# Patient Record
Sex: Female | Born: 1965 | Race: White | Hispanic: No | Marital: Married | Smoking: Never smoker
Health system: Southern US, Community
[De-identification: ages and names within clinical notes are randomized; demographics above are authoritative.]

## PROBLEM LIST (undated history)

## (undated) DIAGNOSIS — H269 Unspecified cataract: Secondary | ICD-10-CM

## (undated) DIAGNOSIS — I10 Essential (primary) hypertension: Secondary | ICD-10-CM

## (undated) DIAGNOSIS — E119 Type 2 diabetes mellitus without complications: Secondary | ICD-10-CM

## (undated) DIAGNOSIS — G709 Myoneural disorder, unspecified: Secondary | ICD-10-CM

## (undated) HISTORY — PX: CHOLECYSTECTOMY: SHX55

## (undated) HISTORY — DX: Unspecified cataract: H26.9

## (undated) HISTORY — DX: Myoneural disorder, unspecified: G70.9

## (undated) HISTORY — DX: Type 2 diabetes mellitus without complications: E11.9

## (undated) HISTORY — DX: Essential (primary) hypertension: I10

---

## 2006-05-13 ENCOUNTER — Ambulatory Visit: Payer: Self-pay | Admitting: Obstetrics and Gynecology

## 2006-05-16 ENCOUNTER — Ambulatory Visit: Payer: Self-pay | Admitting: Obstetrics and Gynecology

## 2009-07-26 ENCOUNTER — Ambulatory Visit: Payer: Self-pay | Admitting: Obstetrics and Gynecology

## 2009-08-07 ENCOUNTER — Ambulatory Visit: Payer: Self-pay | Admitting: Obstetrics and Gynecology

## 2010-01-16 ENCOUNTER — Ambulatory Visit: Payer: Self-pay | Admitting: Internal Medicine

## 2010-01-23 ENCOUNTER — Ambulatory Visit: Payer: Self-pay | Admitting: Internal Medicine

## 2011-08-30 IMAGING — US TRANSABDOMINAL ULTRASOUND OF PELVIS
1 series · 17 of 25 positions shown · non-contrast
Comparison: none

REASON FOR EXAM: pelvic pain eval possible cyst on left ovary shown on
recent CT scan
COMMENTS:

[Series 1: transabdominal ultrasound of pelvis · 17 of 44 slices shown]
[im 1/44]
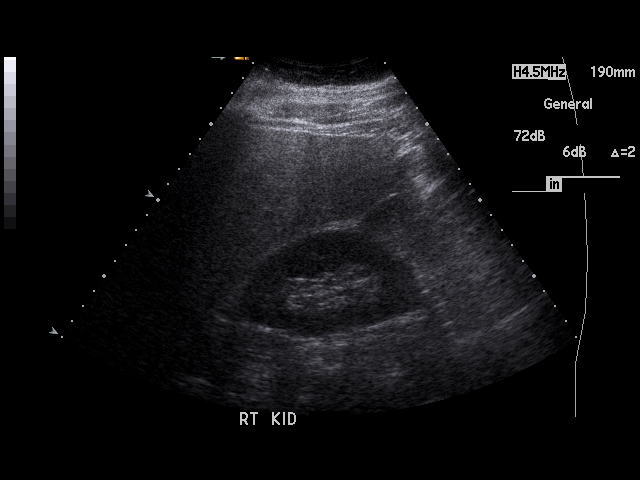
[im 4/44]
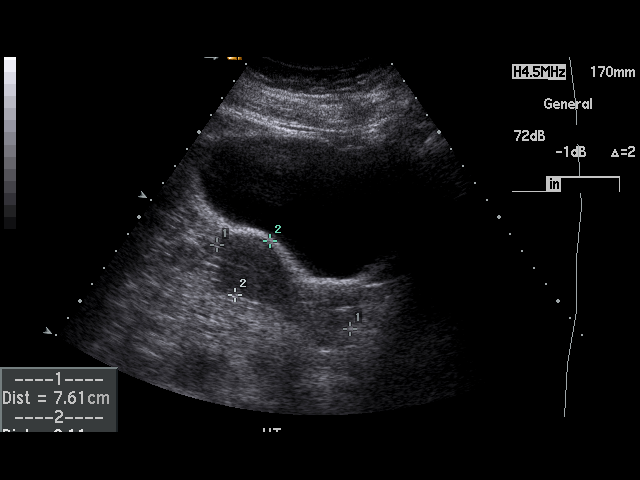
[im 6/44]
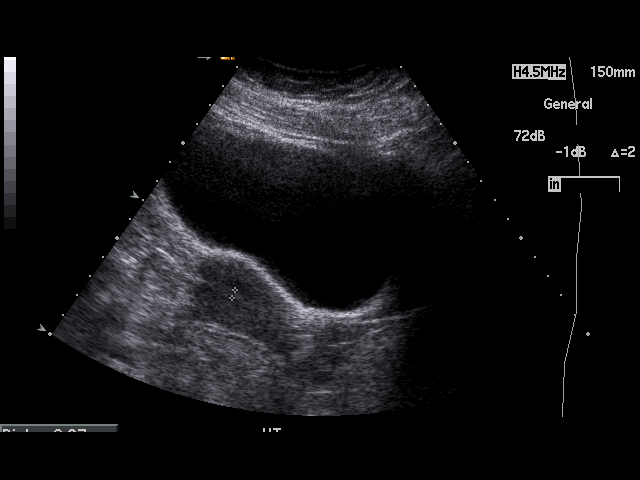
[im 9/44]
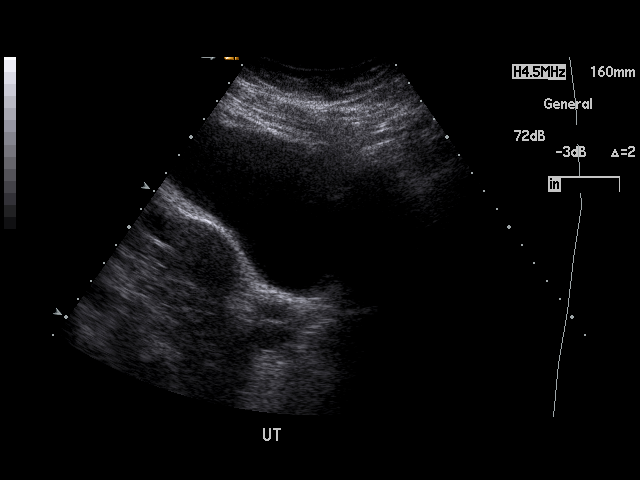
[im 11/44]
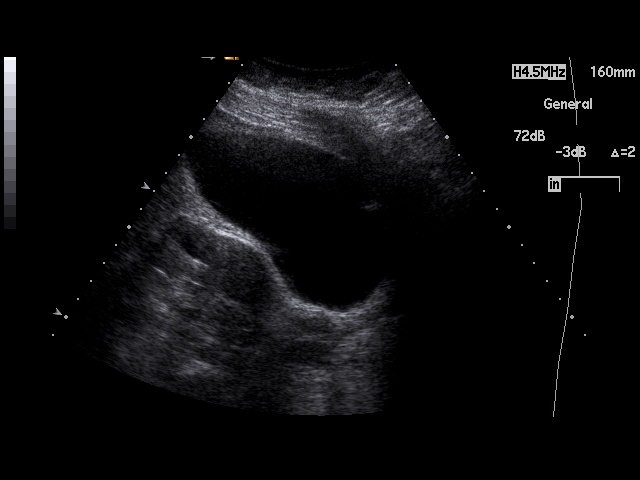
[im 15/44]
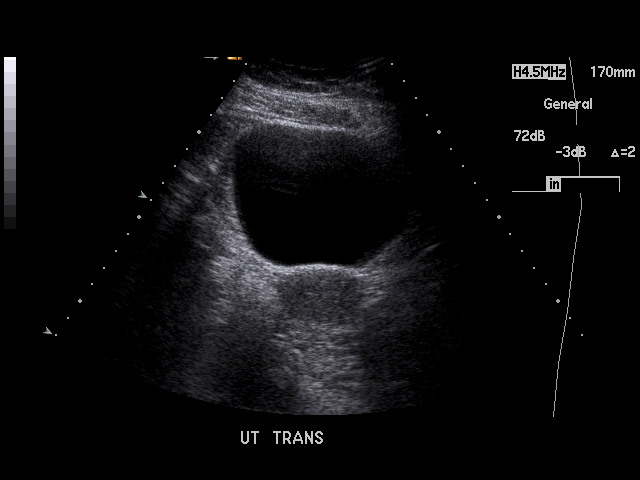
[im 17/44]
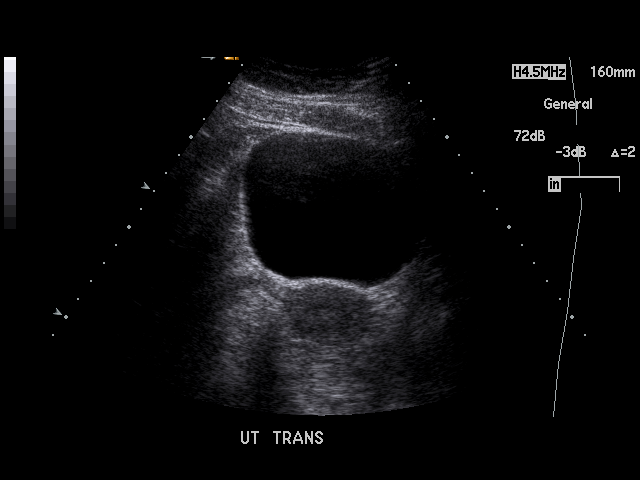
[im 20/44]
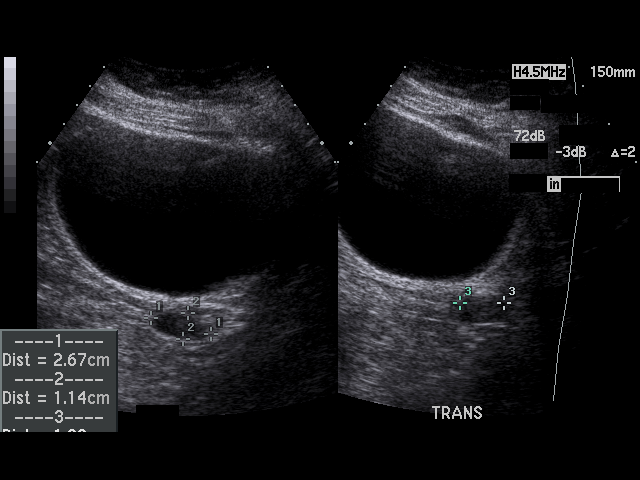
[im 22/44]
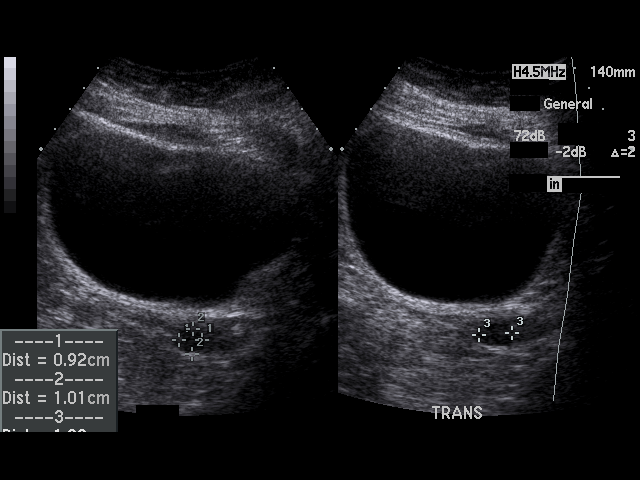
[im 24/44]
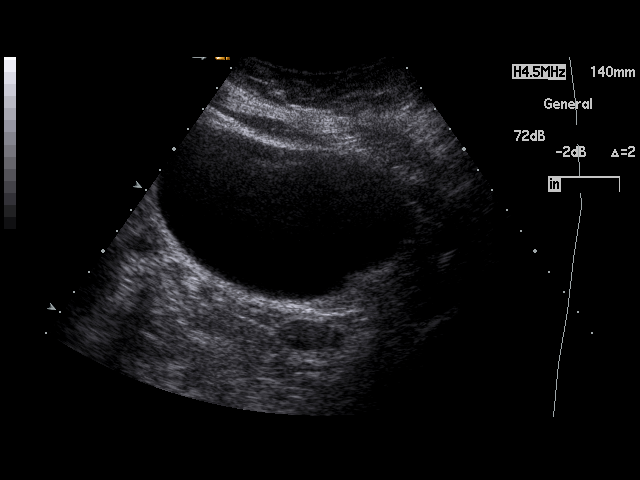
[im 27/44]
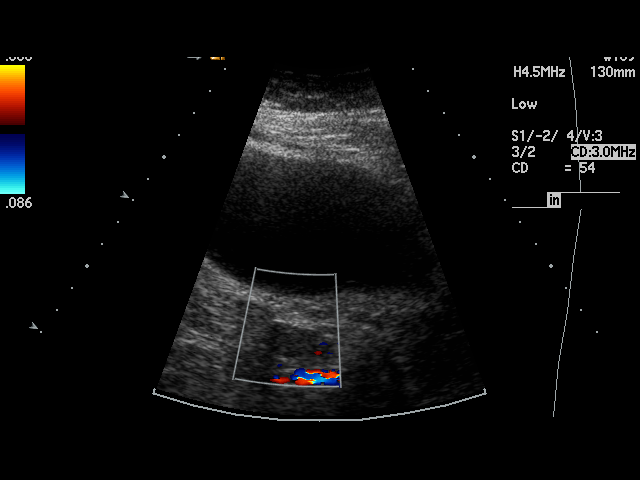
[im 29/44]
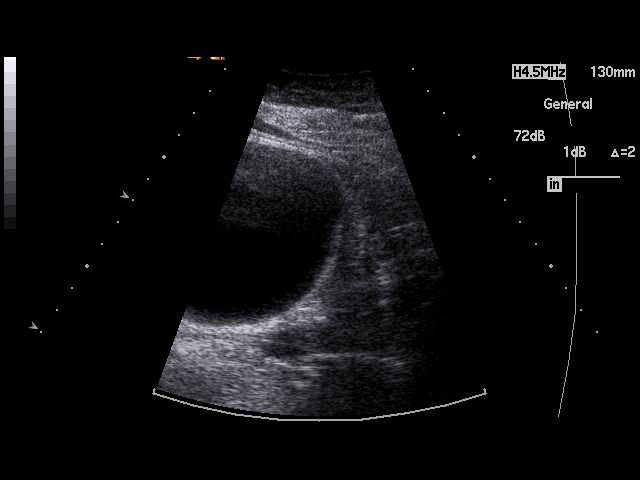
[im 33/44]
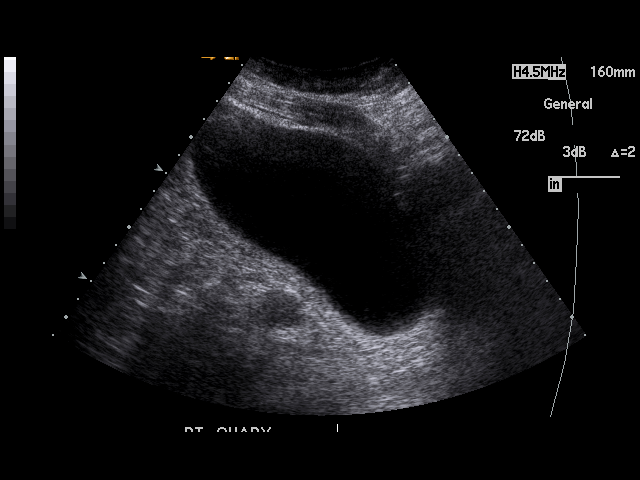
[im 35/44]
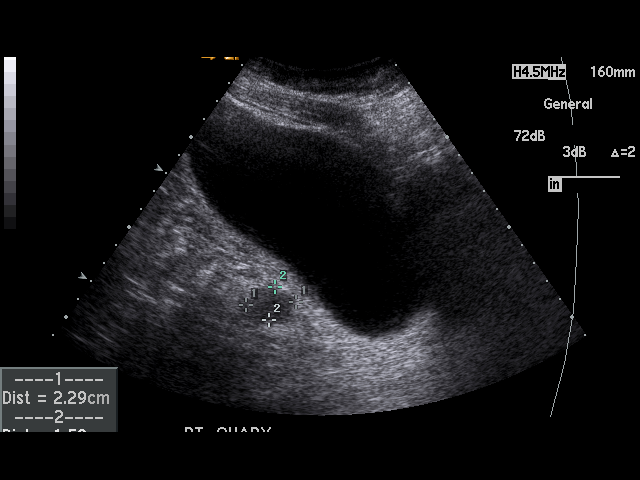
[im 38/44]
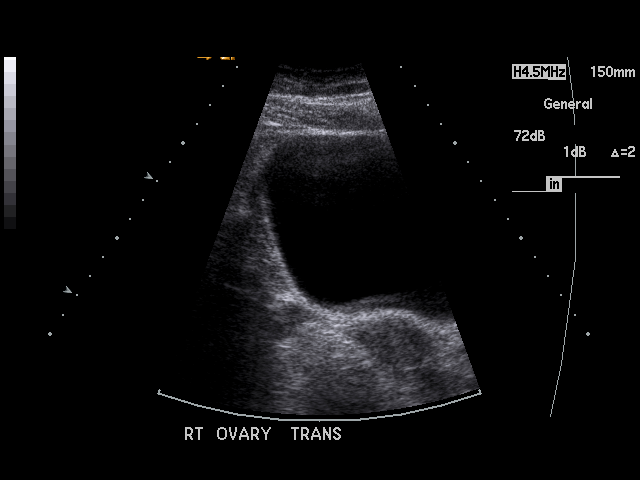
[im 40/44]
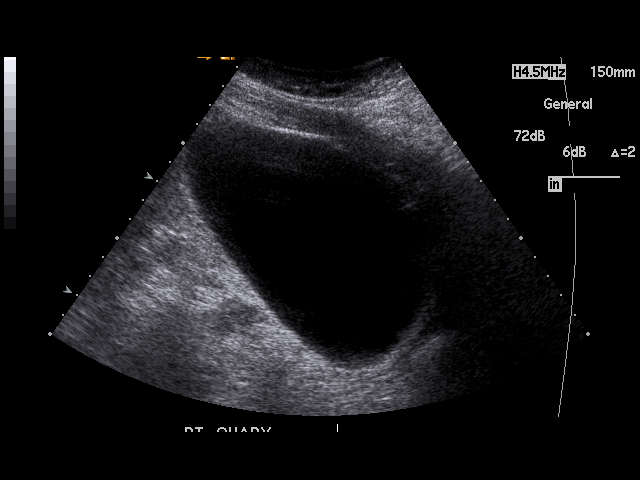
[im 44/44]
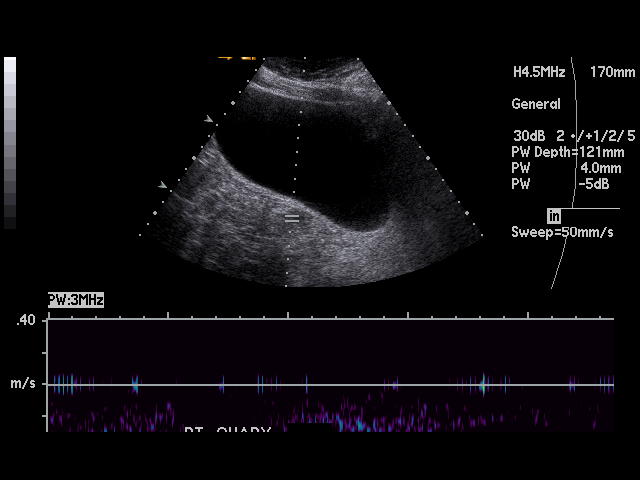

[17 of 25 positions shown; findings below may reference images not displayed]

PROCEDURE:     PAMELA - PAMELA PELVIS NON-OB  - January 23, 2010 [DATE]

RESULT:     The uterus is normal in echotexture and contour. It measures
x 3.1 x 4.2 cm. The endometrial stripe is normal at just under 4 mm in
thickness. No abnormal endometrial fluid collections are demonstrated. There
are nabothian cysts in the cervix.

The right ovary measures 2.3 x 1.5 x 1.5 cm and exhibits normal echotexture
and vascularity. The left ovary measures 2.7 x 1.1 x 1.9 cm and contains a
tiny cyst measuring 1 x 0.9 x 1.3 cm. Vascularity of the left ovary appears
normal. There is no free fluid in the pelvis. Survey views of the kidneys
reveal no evidence of hydronephrosis.
IMPRESSION: 1. There is a normal appearance of the uterus. There are nabothian cysts
present.
2. There is a tiny cyst in the left ovary that appears to be simple cystic
in nature. This corresponds to the CT findings on 16 January, 2010. I see no
abnormalities elsewhere within the adnexal regions.

## 2012-01-08 DIAGNOSIS — Z8619 Personal history of other infectious and parasitic diseases: Secondary | ICD-10-CM | POA: Insufficient documentation

## 2012-12-09 DIAGNOSIS — E119 Type 2 diabetes mellitus without complications: Secondary | ICD-10-CM | POA: Insufficient documentation

## 2013-01-07 HISTORY — PX: OTHER SURGICAL HISTORY: SHX169

## 2013-02-11 DIAGNOSIS — I10 Essential (primary) hypertension: Secondary | ICD-10-CM | POA: Insufficient documentation

## 2013-03-30 DIAGNOSIS — IMO0002 Reserved for concepts with insufficient information to code with codable children: Secondary | ICD-10-CM | POA: Insufficient documentation

## 2013-06-11 LAB — HM COLONOSCOPY

## 2013-10-20 LAB — HM PAP SMEAR: HM Pap smear: NEGATIVE

## 2015-02-14 LAB — LIPID PANEL
CHOLESTEROL: 168 mg/dL (ref 0–200)
HDL: 32 mg/dL — AB (ref 35–70)
LDL Cholesterol: 88 mg/dL
TRIGLYCERIDES: 238 mg/dL — AB (ref 40–160)

## 2015-02-14 LAB — HEPATIC FUNCTION PANEL
ALT: 40 U/L — AB (ref 7–35)
AST: 29 U/L (ref 13–35)
Alkaline Phosphatase: 73 U/L (ref 25–125)

## 2015-02-14 LAB — BASIC METABOLIC PANEL
BUN: 14 mg/dL (ref 4–21)
Creatinine: 0.7 mg/dL (ref 0.5–1.1)
Glucose: 177 mg/dL
POTASSIUM: 4 mmol/L (ref 3.4–5.3)
SODIUM: 141 mmol/L (ref 137–147)

## 2015-02-14 LAB — CBC AND DIFFERENTIAL
HEMATOCRIT: 40 % (ref 36–46)
HEMOGLOBIN: 13.3 g/dL (ref 12.0–16.0)
PLATELETS: 316 10*3/uL (ref 150–399)
WBC: 8.3 10*3/mL

## 2015-02-14 LAB — TSH: TSH: 2.04 u[IU]/mL (ref 0.41–5.90)

## 2015-02-14 LAB — MICROALBUMIN, URINE: Microalb, Ur: 13.6

## 2015-03-07 LAB — HM MAMMOGRAPHY

## 2015-06-02 DIAGNOSIS — N951 Menopausal and female climacteric states: Secondary | ICD-10-CM | POA: Insufficient documentation

## 2015-06-02 DIAGNOSIS — E1141 Type 2 diabetes mellitus with diabetic mononeuropathy: Secondary | ICD-10-CM | POA: Insufficient documentation

## 2015-11-15 LAB — HEMOGLOBIN A1C: HEMOGLOBIN A1C: 7.4

## 2015-11-15 LAB — HM DIABETES FOOT EXAM: HM DIABETIC FOOT EXAM: NORMAL

## 2016-02-09 DIAGNOSIS — M5431 Sciatica, right side: Secondary | ICD-10-CM | POA: Diagnosis not present

## 2016-02-09 DIAGNOSIS — M25551 Pain in right hip: Secondary | ICD-10-CM | POA: Diagnosis not present

## 2016-02-09 DIAGNOSIS — M9903 Segmental and somatic dysfunction of lumbar region: Secondary | ICD-10-CM | POA: Diagnosis not present

## 2016-04-09 ENCOUNTER — Encounter: Payer: Self-pay | Admitting: Family Medicine

## 2016-04-09 ENCOUNTER — Ambulatory Visit (INDEPENDENT_AMBULATORY_CARE_PROVIDER_SITE_OTHER): Payer: 59 | Admitting: Family Medicine

## 2016-04-09 VITALS — BP 131/82 | HR 88 | Temp 98.4°F | Resp 17 | Ht 63.75 in | Wt 189.0 lb

## 2016-04-09 DIAGNOSIS — E785 Hyperlipidemia, unspecified: Secondary | ICD-10-CM

## 2016-04-09 DIAGNOSIS — E1169 Type 2 diabetes mellitus with other specified complication: Secondary | ICD-10-CM

## 2016-04-09 DIAGNOSIS — I10 Essential (primary) hypertension: Secondary | ICD-10-CM

## 2016-04-09 DIAGNOSIS — F4329 Adjustment disorder with other symptoms: Secondary | ICD-10-CM

## 2016-04-09 DIAGNOSIS — E1141 Type 2 diabetes mellitus with diabetic mononeuropathy: Secondary | ICD-10-CM

## 2016-04-09 DIAGNOSIS — E1149 Type 2 diabetes mellitus with other diabetic neurological complication: Secondary | ICD-10-CM

## 2016-04-09 DIAGNOSIS — F4321 Adjustment disorder with depressed mood: Secondary | ICD-10-CM

## 2016-04-09 DIAGNOSIS — Z8619 Personal history of other infectious and parasitic diseases: Secondary | ICD-10-CM

## 2016-04-09 DIAGNOSIS — Z634 Disappearance and death of family member: Secondary | ICD-10-CM

## 2016-04-09 LAB — MICROALBUMIN, URINE WAIVED
Creatinine, Urine Waived: 200 mg/dL (ref 10–300)
Microalb, Ur Waived: 80 mg/L — ABNORMAL HIGH (ref 0–19)

## 2016-04-09 MED ORDER — DULOXETINE HCL 20 MG PO CPEP: 20.0000 mg | ORAL_CAPSULE | Freq: Every day | ORAL | 3 refills | Status: DC

## 2016-04-09 NOTE — Assessment & Plan Note (Signed)
Under good control. Continue current regimen. Continue to monitor. Call with any concerns. 

## 2016-04-09 NOTE — Assessment & Plan Note (Signed)
Will start her on cymbalta. List of grief events given. Call with any concerns. Recheck 1 month.

## 2016-04-09 NOTE — Progress Notes (Signed)
BP 131/82 (BP Location: Left Arm, Patient Position: Sitting, Cuff Size: Normal)   Pulse 88   Temp 98.4 F (36.9 C) (Oral)   Resp 17   Ht 5' 3.75" (1.619 m)   Wt 189 lb (85.7 kg)   SpO2 97%   BMI 32.70 kg/m    Subjective:    Patient ID: Lori Horne, female    DOB: Nov 23, 1965, 51 y.o.   MRN: [id number]  HPI: Lori Horne is a 51 y.o. female who presents today to establish care  Chief Complaint  Patient presents with  . Establish Care  . Diabetes  . Hypertension  . Hyperlipidemia   DIABETES- diagnosed about 5 years ago Hypoglycemic episodes:no Polydipsia/polyuria: no Visual disturbance: no Chest pain: no Paresthesias: no Glucose Monitoring: yes  Accucheck frequency: 2x a week  Fasting glucose: 102-120 Taking Insulin?: no Blood Pressure Monitoring: not checking Retinal Examination: Not up to Date Foot Exam: Up to Date Diabetic Education: Completed Pneumovax: Not up to Date Influenza: Not up to Date Aspirin: yes  HYPERTENSION / HYPERLIPIDEMIA Satisfied with current treatment? yes Duration of hypertension: chronic BP monitoring frequency: not checking BP medication side effects: no Past BP meds: losartan-HCTZ Duration of hyperlipidemia: chronic Cholesterol medication side effects: no Cholesterol supplements: none Past cholesterol medications: pravastatin Medication compliance: excellent compliance Aspirin: yes Recent stressors: yes Recurrent headaches: no Visual changes: no Palpitations: no Dyspnea: no Chest pain: no Lower extremity edema: no Dizzy/lightheaded: no  DEPRESSION Mood status: exacerbated Satisfied with current treatment?: no Symptom severity: moderate  Duration of current treatment : Not on anything Psychotherapy/counseling: no  Previous psychiatric medications: none Depressed mood: yes Anxious mood: no Anhedonia: no Significant weight loss or gain: no Insomnia: no  Fatigue: yes Feelings of worthlessness or guilt: no Impaired  concentration/indecisiveness: yes Suicidal ideations: no Hopelessness: no Crying spells: yes Depression screen PHQ 2/9 04/09/2016  Decreased Interest 0  Down, Depressed, Hopeless 1  PHQ - 2 Score 1    Active Ambulatory Problems    Diagnosis Date Noted  . Hypertension 02/11/2013  . 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 PSC (posterior subcapsular cataract), left 03/30/2013  . Type 2 diabetes mellitus (HCC) 12/09/2012  . Vasomotor symptoms due to menopause 06/02/2015  . Hyperlipidemia associated with type 2 diabetes mellitus (HCC) 04/09/2016  . History of Helicobacter pylori infection 01/08/2012  . 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 No Resolved Ambulatory Problems   Past Medical History:  Diagnosis Date  . Cataract   . Diabetes mellitus without complication (HCC)   . Hypertension   . Neuromuscular disorder [facility])    Past Surgical History:  Procedure Laterality Date  . catarac surgery Left 01/07/2013   Outpatient Encounter Prescriptions as of 04/09/2016  Medication Sig  . aspirin EC 81 MG tablet Take 81 mg by mouth.  Marland Kitchen glimepiride (AMARYL) 1 MG tablet Take 1 mg by mouth.  . hydrochlorothiazide (HYDRODIURIL) 25 MG tablet Take 25 mg by mouth.  . losartan (COZAAR) 50 MG tablet Take 50 mg by mouth.  . metFORMIN (GLUCOPHAGE) 500 MG tablet Take 1,000 mg by mouth.  . Multiple Vitamin (MULTI-VITAMINS) TABS Take by mouth.  . pravastatin (PRAVACHOL) 20 MG tablet Take 20 mg by mouth.  . [DISCONTINUED] gabapentin (NEURONTIN) 300 MG capsule Take 300 mg by mouth.  . DULoxetine (CYMBALTA) 20 MG capsule Take 1 capsule (20 mg total) by mouth daily.   No facility-administered encounter medications on file as of  04/09/2016.    No Known Allergies  Social History   Social History  . Marital status: Married    Spouse name: N/A  . Number of children: N/A  . Years of education: N/A   Social History Main Topics  . Smoking  status: Never Smoker  . Smokeless tobacco: Never Used  . Alcohol use None     Comment: Occasional glass of wine  . Drug use: No  . Sexual activity: Not Asked   Other Topics Concern  . None   Social History Narrative  . None   Family History  Problem Relation Age of Onset  . Adopted: Yes   Review of Systems  Constitutional: Negative.   Respiratory: Negative.   Cardiovascular: Negative.   Neurological: Negative.   Psychiatric/Behavioral: Positive for dysphoric mood. Negative for agitation, behavioral problems, confusion, decreased concentration, hallucinations, self-injury, sleep disturbance and suicidal ideas. The patient is nervous/anxious. The patient is not hyperactive.     Per HPI unless specifically indicated above     Objective:    BP 131/82 (BP Location: Left Arm, Patient Position: Sitting, Cuff Size: Normal)   Pulse 88   Temp 98.4 F (36.9 C) (Oral)   Resp 17   Ht 5' 3.75" (1.619 m)   Wt 189 lb (85.7 kg)   SpO2 97%   BMI 32.70 kg/m   Wt Readings from Last 3 Encounters:  04/09/16 189 lb (85.7 kg)    Physical Exam  Constitutional: She is oriented to person, place, and time. She appears well-developed and well-nourished. No distress.  HENT:  Head: Normocephalic and atraumatic.  Right Ear: Hearing normal.  Left Ear: Hearing normal.  Nose: Nose normal.  Eyes: Conjunctivae and lids are normal. Right eye exhibits no discharge. Left eye exhibits no discharge. No scleral icterus.  Cardiovascular: Normal rate, regular rhythm, normal heart sounds and intact distal pulses.  Exam reveals no gallop and no friction rub.   No murmur heard. Pulmonary/Chest: Effort normal and breath sounds normal. No respiratory distress. She has no wheezes. She has no rales. She exhibits no tenderness.  Musculoskeletal: Normal range of motion.  Neurological: She is alert and oriented to person, place, and time.  Skin: Skin is warm, dry and intact. No rash noted. She is not diaphoretic.  No erythema. No pallor.  Psychiatric: She has a normal mood and affect. Her speech is normal and behavior is normal. Judgment and thought content normal. Cognition and memory are normal.  Tearful at times  Nursing note and vitals reviewed.   Results for orders placed or performed in visit on 04/09/16  HM MAMMOGRAPHY  Result Value Ref Range   HM Mammogram 0-4 Bi-Rad 0-4 Bi-Rad, Self Reported Normal  Hemoglobin A1c  Result Value Ref Range   Hemoglobin A1C 7.4   Microalbumin, urine  Result Value Ref Range   Microalb, Ur 13.6   HM PAP SMEAR  Result Value Ref Range   HM Pap smear Negative with Negative HPV   Basic metabolic panel  Result Value Ref Range   Glucose 177 mg/dL   BUN 14 4 - 21 mg/dL   Creatinine 0.7 0.5 - 1.1 mg/dL   Potassium 4.0 3.4 - 5.3 mmol/L   Sodium 141 137 - 147 mmol/L  Lipid panel  Result Value Ref Range   Triglycerides 238 (A) 40 - 160 mg/dL   Cholesterol 098 0 - 119 mg/dL   HDL 32 (A) 35 - 70 mg/dL   LDL Cholesterol 88 mg/dL  Hepatic function panel  Result Value Ref Range   Alkaline Phosphatase 73 25 - 125 U/L   ALT 40 (A) 7 - 35 U/L   AST 29 13 - 35 U/L  TSH  Result Value Ref Range   TSH 2.04 0.41 - 5.90 uIU/mL  CBC and differential  Result Value Ref Range   Hemoglobin 13.3 12.0 - 16.0 g/dL   HCT 40 36 - 46 %   Platelets 316 150 - 399 K/L   WBC 8.3 10^3/mL  HM DIABETES FOOT EXAM  Result Value Ref Range   HM Diabetic Foot Exam Normal   HM COLONOSCOPY  Result Value Ref Range   HM Colonoscopy See Report (in chart) See Report (in chart), Patient Reported      Assessment & Plan:   Problem List Items Addressed This Visit      Cardiovascular and Mediastinum   Hypertension - Primary    Under good control. Continue current regimen. Continue to monitor. Call with any concerns.       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 Call with any concerns. Continue to monitor.       Type 2 diabetes mellitus (HCC)    Under good control. Continue current regimen. Continue to monitor. Call with any concerns.       Relevant Orders   Comprehensive metabolic panel   Microalbumin, Urine Waived   Hgb A1c w/o eAG   Hyperlipidemia associated with type 2 diabetes mellitus (HCC)    Under good control. Continue current regimen. Continue to monitor. Call with any concerns.       Relevant Orders   Comprehensive metabolic panel   Lipid Panel w/o Chol/HDL Ratio     Other   Complicated grief    Will start her on cymbalta. List of grief events given. Call with any concerns. Recheck 1 month.           Follow up plan: Return in about 4 weeks (around 05/07/2016) for Follow up mood.

## 2016-04-09 NOTE — Assessment & Plan Note (Signed)
Stable while seeing chiropractor. Call with any concerns. Continue to monitor.

## 2016-04-09 NOTE — Patient Instructions (Addendum)
Complicated Grieving Grief is a normal response to the death of someone close to you. Feelings of fear, anger, and guilt can affect almost everyone who loses a loved one. It is also common to have symptoms of depression while you are grieving. These include problems with sleep, loss of appetite, and lack of energy. They may last for weeks or months after a loss. Complicated grief is different from normal grief or depression. Normal grieving involves sadness and feelings of loss, but these feelings are not constant. Complicated grief is a constant and severe type of grief. It interferes with your ability to function normally. It may last for several months to a year or longer. Complicated grief may require treatment from a mental health care provider. What are the causes? It is not known why some people continue to struggle with grief and others do not. You may be at higher risk for complicated grief if:  The death of your loved one was sudden or unexpected.  The death of your loved one was due to a violent event.  Your loved one committed suicide.  Your loved one was a child or a young person.  You were very close to or dependent on the loved one.  You have a history of depression. What are the signs or symptoms? Signs and symptoms of complicated grief may include:  Feeling disbelief or numbness.  Being unable to enjoy good memories of your loved one.  Needing to avoid anything that reminds you of your loved one.  Being unable to stop thinking about the death.  Feeling intense anger or guilt.  Feeling alone and hopeless.  Feeling that your life is meaningless and empty.  Losing the desire to live. How is this diagnosed? Your health care provider may diagnose complicated grief if:  You have constant symptoms of grief for 6-12 months or longer.  Your symptoms are interfering with your ability to live your life. Your health care provider may want you to see a mental health care  provider. Many symptoms of depression are similar to the symptoms of complicated grief. It is important to be evaluated for complicated grief along with other mental health conditions. How is this treated? Talk therapy with a mental health provider is the most common treatment for complicated grief. During therapy, you will learn healthy ways to cope with the loss of your loved one. In some cases, your mental health care provider may also recommend antidepressant medicines. Follow these instructions at home:  Take care of yourself.  Eat regular meals and maintain a healthy diet. Eat plenty of fruits, vegetables, and whole grains.  Try to get some exercise each day.  Keep regular hours for sleep. Try to get at least 8 hours of sleep each night.  Do not use drugs or alcohol to ease your symptoms.  Take medicines only as directed by your health care provider.  Spend time with friends and loved ones.  Consider joining a grief (bereavement) support group to help you deal with your loss.  Keep all follow-up visits as directed by your health care provider. This is important. Contact a health care provider if:  Your symptoms keep you from functioning normally.  Your symptoms do not get better with treatment. Get help right away if:  You have serious thoughts of hurting yourself or someone else.  You have suicidal feelings. This information is not intended to replace advice given to you by your health care provider. Make sure you discuss any questions   you have with your health care provider. Document Released: 12/24/2004 Document Revised: 06/01/2015 Document Reviewed: 06/03/2013 Elsevier Interactive Patient Education  2017 [employer] Duloxetine delayed-release capsules What is this medicine? DULOXETINE (doo LOX e teen) is used to treat depression, anxiety, and different types of chronic pain. This medicine may be used for other purposes; ask your health care provider or pharmacist if  you have questions. COMMON BRAND NAME(S): Cymbalta, Irenka What should I tell my health care provider before I take this medicine? They need to know if you have any of these conditions: -bipolar disorder or a family history of bipolar disorder -glaucoma -kidney disease -liver disease -suicidal thoughts or a previous suicide attempt -taken medicines called MAOIs like Carbex, Eldepryl, Marplan, Nardil, and Parnate within 14 days -an unusual reaction to duloxetine, other medicines, foods, dyes, or preservatives -pregnant or trying to get pregnant -breast-feeding How should I use this medicine? Take this medicine by mouth with a glass of water. Follow the directions on the prescription label. Do not cut, crush or chew this medicine. You can take this medicine with or without food. Take your medicine at regular intervals. Do not take your medicine more often than directed. Do not stop taking this medicine suddenly except upon the advice of your doctor. Stopping this medicine too quickly may cause serious side effects or your condition may worsen. A special MedGuide will be given to you by the pharmacist with each prescription and refill. Be sure to read this information carefully each time. Talk to your pediatrician regarding the use of this medicine in children. While this drug may be prescribed for children as young as 14 years of age for selected conditions, precautions do apply. Overdosage: If you think you have taken too much of this medicine contact a poison control center or emergency room at once. NOTE: This medicine is only for you. Do not share this medicine with others. What if I miss a dose? If you miss a dose, take it as soon as you can. If it is almost time for your next dose, take only that dose. Do not take double or extra doses. What may interact with this medicine? Do not take this medicine with any of the following medications: -desvenlafaxine -levomilnacipran -linezolid -MAOIs  like Carbex, Eldepryl, Marplan, Nardil, and Parnate -methylene blue (injected into a vein) -milnacipran -thioridazine -venlafaxine This medicine may also interact with the following medications: -alcohol -amphetamines -aspirin and aspirin-like medicines -certain antibiotics like ciprofloxacin and enoxacin -certain medicines for blood pressure, heart disease, irregular heart beat -certain medicines for depression, anxiety, or psychotic disturbances -certain medicines for migraine headache like almotriptan, eletriptan, frovatriptan, naratriptan, rizatriptan, sumatriptan, zolmitriptan -certain medicines that treat or prevent blood clots like warfarin, enoxaparin, and dalteparin -cimetidine -fentanyl -lithium -NSAIDS, medicines for pain and inflammation, like ibuprofen or naproxen -phentermine -procarbazine -rasagiline -sibutramine -St. John's wort -theophylline -tramadol -tryptophan This list may not describe all possible interactions. Give your health care provider a list of all the medicines, herbs, non-prescription drugs, or dietary supplements you use. Also tell them if you smoke, drink alcohol, or use illegal drugs. Some items may interact with your medicine. What should I watch for while using this medicine? Tell your doctor if your symptoms do not get better or if they get worse. Visit your doctor or health care professional for regular checks on your progress. Because it may take several weeks to see the full effects of this medicine, it is important to continue your treatment as prescribed by your  doctor. Patients and their families should watch out for new or worsening thoughts of suicide or depression. Also watch out for sudden changes in feelings such as feeling anxious, agitated, panicky, irritable, hostile, aggressive, impulsive, severely restless, overly excited and hyperactive, or not being able to sleep. If this happens, especially at the beginning of treatment or after a  change in dose, call your health care professional. Bonita Quin may get drowsy or dizzy. Do not drive, use machinery, or do anything that needs mental alertness until you know how this medicine affects you. Do not stand or sit up quickly, especially if you are an older patient. This reduces the risk of dizzy or fainting spells. Alcohol may interfere with the effect of this medicine. Avoid alcoholic drinks. This medicine can cause an increase in blood pressure. This medicine can also cause a sudden drop in your blood pressure, which may make you feel faint and increase the chance of a fall. These effects are most common when you first start the medicine or when the dose is increased, or during use of other medicines that can cause a sudden drop in blood pressure. Check with your doctor for instructions on monitoring your blood pressure while taking this medicine. Your mouth may get dry. Chewing sugarless gum or sucking hard candy, and drinking plenty of water may help. Contact your doctor if the problem does not go away or is severe. What side effects may I notice from receiving this medicine? Side effects that you should report to your doctor or health care professional as soon as possible: -allergic reactions like skin rash, itching or hives, swelling of the face, lips, or tongue -anxious -breathing problems -confusion -changes in vision -chest pain -confusion -elevated mood, decreased need for sleep, racing thoughts, impulsive behavior -eye pain -fast, irregular heartbeat -feeling faint or lightheaded, falls -feeling agitated, angry, or irritable -hallucination, loss of contact with reality -high blood pressure -loss of balance or coordination -palpitations -redness, blistering, peeling or loosening of the skin, including inside the mouth -restlessness, pacing, inability to keep still -seizures -stiff muscles -suicidal thoughts or other mood changes -trouble passing urine or change in the amount  of urine -trouble sleeping -unusual bleeding or bruising -unusually weak or tired -vomiting -yellowing of the eyes or skin Side effects that usually do not require medical attention (report to your doctor or health care professional if they continue or are bothersome): -change in sex drive or performance -change in appetite or weight -constipation -dizziness -dry mouth -headache -increased sweating -nausea -tired This list may not describe all possible side effects. Call your doctor for medical advice about side effects. You may report side effects to FDA at 1-800-FDA-1088. Where should I keep my medicine? Keep out of the reach of children. Store at room temperature between 20 and 25 degrees C (68 to 77 degrees F). Throw away any unused medicine after the expiration date. NOTE: This sheet is a summary. It may not cover all possible information. If you have questions about this medicine, talk to your doctor, pharmacist, or health care provider.  2018 Elsevier/Gold Standard (2015-05-25 18:16:03)

## 2016-04-10 ENCOUNTER — Telehealth: Payer: Self-pay | Admitting: Family Medicine

## 2016-04-10 LAB — COMPREHENSIVE METABOLIC PANEL
A/G RATIO: 1.3 (ref 1.2–2.2)
ALBUMIN: 4.1 g/dL (ref 3.5–5.5)
ALT: 17 IU/L (ref 0–32)
AST: 14 IU/L (ref 0–40)
Alkaline Phosphatase: 77 IU/L (ref 39–117)
BUN / CREAT RATIO: 23 (ref 9–23)
BUN: 17 mg/dL (ref 6–24)
Bilirubin Total: 0.3 mg/dL (ref 0.0–1.2)
CALCIUM: 9.5 mg/dL (ref 8.7–10.2)
CO2: 21 mmol/L (ref 18–29)
CREATININE: 0.73 mg/dL (ref 0.57–1.00)
Chloride: 98 mmol/L (ref 96–106)
GFR calc Af Amer: 109 mL/min/{1.73_m2} (ref >59)
GFR, EST NON AFRICAN AMERICAN: 94 mL/min/{1.73_m2} (ref >59)
GLOBULIN, TOTAL: 3.2 g/dL (ref 1.5–4.5)
GLUCOSE: 163 mg/dL — AB (ref 65–99)
POTASSIUM: 3.9 mmol/L (ref 3.5–5.2)
SODIUM: 137 mmol/L (ref 134–144)
Total Protein: 7.3 g/dL (ref 6.0–8.5)

## 2016-04-10 LAB — LIPID PANEL W/O CHOL/HDL RATIO
Cholesterol, Total: 164 mg/dL (ref 100–199)
HDL: 34 mg/dL — ABNORMAL LOW (ref >39)
LDL CALC: 75 mg/dL (ref 0–99)
Triglycerides: 277 mg/dL — ABNORMAL HIGH (ref 0–149)
VLDL Cholesterol Cal: 55 mg/dL — ABNORMAL HIGH (ref 5–40)

## 2016-04-10 LAB — HGB A1C W/O EAG: Hgb A1c MFr Bld: 7.5 % — ABNORMAL HIGH (ref 4.8–5.6)

## 2016-04-10 NOTE — Telephone Encounter (Signed)
Called and discussed results. Stable. Depressed right now. Will get that under control and give her 3 months to work on diet before changing medicine.

## 2016-04-12 DIAGNOSIS — M25551 Pain in right hip: Secondary | ICD-10-CM | POA: Diagnosis not present

## 2016-04-12 DIAGNOSIS — M9903 Segmental and somatic dysfunction of lumbar region: Secondary | ICD-10-CM | POA: Diagnosis not present

## 2016-04-12 DIAGNOSIS — M5431 Sciatica, right side: Secondary | ICD-10-CM | POA: Diagnosis not present

## 2016-05-07 ENCOUNTER — Ambulatory Visit: Payer: 59 | Admitting: Family Medicine

## 2016-05-13 NOTE — Progress Notes (Signed)
BP 124/79 (BP Location: Left Arm, Patient Position: Sitting, Cuff Size: Normal)   Pulse 69   Temp 97.8 F (36.6 C)   Wt 191 lb 9.6 oz (86.9 kg)   SpO2 96%   BMI 33.15 kg/m    Subjective:    Patient ID: Lori Horne, female    DOB: [birth year], 51 y.o.   MRN: [id number]  HPI: Lori Hampshireatricia Primeau is a 51 y.o. female  Chief Complaint  Patient presents with  . Depression    90 day supply of Cymbalta sent to [facility]  . Rash    Patient states that she has to wear a lab coat and her arms break out  . Medication Refill    Please send refills for all other medications  . Spasms    left side under ribs   Complicated Grief Mood status: better Satisfied with current treatment?: yes Symptom severity: mild  Duration of current treatment : 1 month Side effects: no Medication compliance: excellent compliance Depressed mood: no Anxious mood: no Anhedonia: no Significant weight loss or gain: no Insomnia: no  Fatigue: no Feelings of worthlessness or guilt: no Impaired concentration/indecisiveness: no Suicidal ideations: no Hopelessness: no Crying spells: no [facility] 05/14/2016 04/09/2016  Decreased Interest 0 0  Down, Depressed, Hopeless 0 1  PHQ - 2 Score 0 1  Altered sleeping 0 -  Tired, decreased energy 1 -  Change in appetite 0 -  Feeling bad or failure about yourself  0 -  Trouble concentrating 0 -  Moving slowly or fidgety/restless 0 -  Suicidal thoughts 0 -  PHQ-9 Score 1 -   RASH- has to wear a lab coat at [facility] that irritates her skin. It's only in the area in contact with her lab coat. Has tried wearing something under neath it, but it still acts up, but now only when she sweats. She has not tried any thing for it.  Duration:  chronic  Location: arms  Itching: yes Burning: yes Redness: yes Oozing: no Scaling: no Blisters: no Painful: no Fevers: no Change in detergents/soaps/personal care products: no Recent illness: no Recent  travel:no History of same: yes Context: stable Alleviating factors: nothing Treatments attempted:nothing Shortness of breath: no  Throat/tongue swelling: no Myalgias/arthralgias: no  Has been having some muscle spasms in her abdomen on the L side, only at night when laying down. Last about 5 minutes and then tend to go away. No other concerns or complaints  Relevant past medical, surgical, family and social history reviewed and updated as indicated. Interim medical history since our last visit reviewed. Allergies and medications reviewed and updated.  Review of Systems  Constitutional: Negative.   Respiratory: Negative.   Cardiovascular: Negative.   Gastrointestinal: Negative.   Musculoskeletal: Positive for myalgias. Negative for arthralgias, back pain, gait problem, joint swelling, neck pain and neck stiffness.  Psychiatric/Behavioral: Negative.     Per HPI unless specifically indicated above     Objective:    BP 124/79 (BP Location: Left Arm, Patient Position: Sitting, Cuff Size: Normal)   Pulse 69   Temp 97.8 F (36.6 C)   Wt 191 lb 9.6 oz (86.9 kg)   SpO2 96%   BMI 33.15 kg/m   Wt Readings from Last 3 Encounters:  05/14/16 191 lb 9.6 oz (86.9 kg)  04/09/16 189 lb (85.7 kg)    Physical Exam  Constitutional: She is oriented to person, place, and time. She appears well-developed and well-nourished. No distress.  HENT:  Head: Normocephalic and atraumatic.  Right Ear: Hearing normal.  Left Ear: Hearing normal.  Nose: Nose normal.  Eyes: Conjunctivae and lids are normal. Right eye exhibits no discharge. Left eye exhibits no discharge. No scleral icterus.  Cardiovascular: Normal rate, regular rhythm, normal heart sounds and intact distal pulses.  Exam reveals no gallop and no friction rub.   No murmur heard. Pulmonary/Chest: Effort normal and breath sounds normal. No respiratory distress. She has no wheezes. She has no rales. She exhibits no tenderness.   Musculoskeletal: Normal range of motion.  Neurological: She is alert and oriented to person, place, and time.  Skin: Skin is warm, dry and intact. Rash (bilateral arms- erythematous irritated excoriated skin) noted. No erythema. No pallor.  Psychiatric: She has a normal mood and affect. Her speech is normal and behavior is normal. Judgment and thought content normal. Cognition and memory are normal.  Nursing note and vitals reviewed.   Results for orders placed or performed in visit on 04/09/16  Comprehensive metabolic panel  Result Value Ref Range   Glucose 163 (H) 65 - 99 mg/dL   BUN 17 6 - 24 mg/dL   Creatinine, Ser 1.61 0.57 - 1.00 mg/dL   GFR calc non Af Amer 94 >59 mL/min/1.73   GFR calc Af Amer 109 >59 mL/min/1.73   BUN/Creatinine Ratio 23 9 - 23   Sodium 137 134 - 144 mmol/L   Potassium 3.9 3.5 - 5.2 mmol/L   Chloride 98 96 - 106 mmol/L   CO2 21 18 - 29 mmol/L   Calcium 9.5 8.7 - 10.2 mg/dL   Total Protein 7.3 6.0 - 8.5 g/dL   Albumin 4.1 3.5 - 5.5 g/dL   Globulin, Total 3.2 1.5 - 4.5 g/dL   Albumin/Globulin Ratio 1.3 1.2 - 2.2   Bilirubin Total 0.3 0.0 - 1.2 mg/dL   Alkaline Phosphatase 77 39 - 117 IU/L   AST 14 0 - 40 IU/L   ALT 17 0 - 32 IU/L  Lipid Panel w/o Chol/HDL Ratio  Result Value Ref Range   Cholesterol, Total 164 100 - 199 mg/dL   Triglycerides 096 (H) 0 - 149 mg/dL   HDL 34 (L) >04 mg/dL   VLDL Cholesterol Cal 55 (H) 5 - 40 mg/dL   LDL Calculated 75 0 - 99 mg/dL  Microalbumin, Urine Waived  Result Value Ref Range   Microalb, Ur Waived 80 (H) 0 - 19 mg/L   Creatinine, Urine Waived 200 10 - 300 mg/dL   Microalb/Creat Ratio 30-300 (H) <30 mg/g  Hgb A1c w/o eAG  Result Value Ref Range   Hgb A1c MFr Bld 7.5 (H) 4.8 - 5.6 %      Assessment & Plan:   Problem List Items Addressed This Visit      Other   Complicated grief - Primary    Significantly better. Continue current regimen. Continue to monitor. Recheck 6 months.        Other Visit  Diagnoses    Screening for breast cancer       Mammogram ordered today.   Relevant Orders   MM Digital Screening   Irritant contact dermatitis due to other agents       Will start her on triamcinalone and claritin. Attempt to avoid contact. Call with any changes or if not getting better.    Muscle spasm       Will start stretches- discussed today, and heat. Call if not getting better or getting worse.  Follow up plan: Return 2 months , for follow up diabetes.

## 2016-05-14 ENCOUNTER — Ambulatory Visit (INDEPENDENT_AMBULATORY_CARE_PROVIDER_SITE_OTHER): Payer: 59 | Admitting: Family Medicine

## 2016-05-14 ENCOUNTER — Encounter: Payer: Self-pay | Admitting: Family Medicine

## 2016-05-14 VITALS — BP 124/79 | HR 69 | Temp 97.8°F | Wt 191.6 lb

## 2016-05-14 DIAGNOSIS — M62838 Other muscle spasm: Secondary | ICD-10-CM | POA: Diagnosis not present

## 2016-05-14 DIAGNOSIS — Z634 Disappearance and death of family member: Secondary | ICD-10-CM | POA: Diagnosis not present

## 2016-05-14 DIAGNOSIS — Z1231 Encounter for screening mammogram for malignant neoplasm of breast: Secondary | ICD-10-CM | POA: Diagnosis not present

## 2016-05-14 DIAGNOSIS — Z1239 Encounter for other screening for malignant neoplasm of breast: Secondary | ICD-10-CM

## 2016-05-14 DIAGNOSIS — L2489 Irritant contact dermatitis due to other agents: Secondary | ICD-10-CM

## 2016-05-14 DIAGNOSIS — F4329 Adjustment disorder with other symptoms: Secondary | ICD-10-CM

## 2016-05-14 DIAGNOSIS — F4321 Adjustment disorder with depressed mood: Secondary | ICD-10-CM

## 2016-05-14 MED ORDER — ASPIRIN EC 81 MG PO TBEC: 81.0000 mg | DELAYED_RELEASE_TABLET | Freq: Every day | ORAL | 3 refills | Status: DC

## 2016-05-14 MED ORDER — GLIMEPIRIDE 1 MG PO TABS: 1.0000 mg | ORAL_TABLET | Freq: Every day | ORAL | 1 refills | Status: DC

## 2016-05-14 MED ORDER — DULOXETINE HCL 20 MG PO CPEP: 20.0000 mg | ORAL_CAPSULE | Freq: Every day | ORAL | 1 refills | Status: DC

## 2016-05-14 MED ORDER — TRIAMCINOLONE ACETONIDE 0.5 % EX OINT: 1.0000 "application " | TOPICAL_OINTMENT | Freq: Two times a day (BID) | CUTANEOUS | 1 refills | Status: DC

## 2016-05-14 MED ORDER — HYDROCHLOROTHIAZIDE 25 MG PO TABS: 25.0000 mg | ORAL_TABLET | Freq: Every day | ORAL | 1 refills | Status: DC

## 2016-05-14 MED ORDER — PRAVASTATIN SODIUM 20 MG PO TABS: 20.0000 mg | ORAL_TABLET | Freq: Every day | ORAL | 1 refills | Status: DC

## 2016-05-14 MED ORDER — LORATADINE 10 MG PO TABS: 10.0000 mg | ORAL_TABLET | Freq: Every day | ORAL | 3 refills | Status: DC

## 2016-05-14 MED ORDER — LOSARTAN POTASSIUM 50 MG PO TABS: 50.0000 mg | ORAL_TABLET | Freq: Every day | ORAL | 1 refills | Status: DC

## 2016-05-14 MED ORDER — METFORMIN HCL 500 MG PO TABS: 1000.0000 mg | ORAL_TABLET | Freq: Two times a day (BID) | ORAL | 1 refills | Status: DC

## 2016-05-14 NOTE — Patient Instructions (Addendum)
Muscle Cramps and Spasms  Muscle cramps and spasms occur when a muscle or muscles tighten and you have no control over this tightening (involuntary muscle contraction). They are a common problem and can develop in any muscle. The most common place is in the calf muscles of the leg. Muscle cramps and muscle spasms are both involuntary muscle contractions, but there are some differences between the two:  · Muscle cramps are painful. They come and go and may last a few seconds to 15 minutes. Muscle cramps are often more forceful and last longer than muscle spasms.  · Muscle spasms may or may not be painful. They may also last just a few seconds or much longer.    Certain medical conditions, such as diabetes or Parkinson disease, can make it more likely to develop cramps or spasms. However, cramps or spasms are usually not caused by a serious underlying problem. Common causes include:  · Overexertion.  · Overuse from repetitive motions, or doing the same thing over and over.  · Remaining in a certain position for a long period of time.  · Improper preparation, form, or technique while playing a sport or doing an activity.  · Dehydration.  · Injury.  · Side effects of some medicines.  · Abnormally low levels of the salts and ions in your blood (electrolytes), especially potassium and calcium. This could happen if you are taking water pills (diuretics) or if you are pregnant.    In many cases, the cause of muscle cramps or spasms is unknown.  Follow these instructions at home:  · Stay well hydrated. Drink enough fluid to keep your urine clear or pale yellow.  · Try massaging, stretching, and relaxing the affected muscle.  · If directed, apply heat to tight or tense muscles as often as told by your health care provider. Use the heat source that your health care provider recommends, such as a moist heat pack or a heating pad.  ? Place a towel between your skin and the heat source.  ? Leave the heat on for 20-30  minutes.  ? Remove the heat if your skin turns bright red. This is especially important if you are unable to feel pain, heat, or cold. You may have a greater risk of getting burned.  · If directed, put ice on the affected area. This may help if you are sore or have pain after a cramp or spasm.  ? Put ice in a plastic bag.  ? Place a towel between your skin and the bag.  ? Leave the ice on for 20 minutes, 2-3 times a day.  · Take over-the-counter and prescription medicines only as told by your health care provider.  · Pay attention to any changes in your symptoms.  Contact a health care provider if:  · Your cramps or spasms get more severe or happen more often.  · Your cramps or spasms do not improve over time.  This information is not intended to replace advice given to you by your health care provider. Make sure you discuss any questions you have with your health care provider.  Document Released: 06/15/2001 Document Revised: 01/25/2015 Document Reviewed: 09/27/2014  Elsevier Interactive Patient Education © 2017 [employer]

## 2016-05-14 NOTE — Assessment & Plan Note (Signed)
Significantly better. Continue current regimen. Continue to monitor. Recheck 6 months.

## 2016-06-07 DIAGNOSIS — M5431 Sciatica, right side: Secondary | ICD-10-CM | POA: Diagnosis not present

## 2016-06-07 DIAGNOSIS — M9903 Segmental and somatic dysfunction of lumbar region: Secondary | ICD-10-CM | POA: Diagnosis not present

## 2016-06-07 DIAGNOSIS — M25551 Pain in right hip: Secondary | ICD-10-CM | POA: Diagnosis not present

## 2016-07-29 ENCOUNTER — Telehealth: Payer: Self-pay | Admitting: Family Medicine

## 2016-07-29 NOTE — Telephone Encounter (Signed)
Patient had a rash over the weekend on her leg that was getting worse, she called the nurse and was given a cream for it and it has seem to help with the rash. She wanted to let us know as she was advised to schedule an appt with us today but at this time she does not think it is necessary. She will call us if she does not continue to improve.  Thanks

## 2016-07-29 NOTE — Telephone Encounter (Signed)
Routing to provider, FYI.  

## 2016-07-29 NOTE — Telephone Encounter (Signed)
Noted  

## 2016-08-15 ENCOUNTER — Encounter: Payer: Self-pay | Admitting: Family Medicine

## 2016-08-15 ENCOUNTER — Telehealth: Payer: Self-pay | Admitting: Family Medicine

## 2016-08-15 ENCOUNTER — Ambulatory Visit (INDEPENDENT_AMBULATORY_CARE_PROVIDER_SITE_OTHER): Payer: 59 | Admitting: Family Medicine

## 2016-08-15 VITALS — BP 135/82 | HR 62 | Temp 97.5°F | Wt 193.0 lb

## 2016-08-15 DIAGNOSIS — E1149 Type 2 diabetes mellitus with other diabetic neurological complication: Secondary | ICD-10-CM

## 2016-08-15 LAB — BAYER DCA HB A1C WAIVED: HB A1C: 7.4 % — AB (ref <7.0)

## 2016-08-15 MED ORDER — DAPAGLIFLOZIN PROPANEDIOL 5 MG PO TABS: 5.0000 mg | ORAL_TABLET | Freq: Every day | ORAL | 1 refills | Status: DC

## 2016-08-15 MED ORDER — EMPAGLIFLOZIN 25 MG PO TABS: 25.0000 mg | ORAL_TABLET | Freq: Every day | ORAL | 1 refills | Status: DC

## 2016-08-15 NOTE — Telephone Encounter (Signed)
Rx sent to her local pharmacy to make sure she tolerates it before we send her a 90 day supply.

## 2016-08-15 NOTE — Telephone Encounter (Signed)
Patient called back to let Dr Laural BenesJohnson know that her insurance would not cover the [place]Farxiga but will cover Jardiance and if okay she would like that instead called in.  Thank You

## 2016-08-15 NOTE — Telephone Encounter (Signed)
Rx sent to her pharmacy 

## 2016-08-15 NOTE — Assessment & Plan Note (Signed)
Still not under great control with A1c of 7.4- will add jardiance and recheck 3 months.

## 2016-08-15 NOTE — Telephone Encounter (Signed)
Patient states that she can get it cheaper through mail order, and if she cant tolerate it then she will just stop taking it.

## 2016-08-15 NOTE — Progress Notes (Signed)
BP 135/82 (BP Location: Left Arm, Patient Position: Sitting, Cuff Size: Large)   Pulse 62   Temp (!) 97.5 F (36.4 C)   Wt 193 lb (87.5 kg)   SpO2 99%   BMI 33.39 kg/m    Subjective:    Patient ID: Lori Horne, female    DOB: 19-Mar-1965, 51 y.o.   MRN: [id number]  HPI: Lori Horne is a 51 y.o. female  Chief Complaint  Patient presents with  . Diabetes   DIABETES- thinks that her sugars are going to be bad Hypoglycemic episodes:no Polydipsia/polyuria: no Visual disturbance: yes Chest pain: no Paresthesias: no Glucose Monitoring: yes  Accucheck frequency: occasionally  Fasting glucose: 120-140 Taking Insulin?: no Blood Pressure Monitoring: not checking Retinal Examination: Not up to Date Foot Exam: Up to Date Diabetic Education: Completed Pneumovax: Declined Influenza: Declined Aspirin: yes   Relevant past medical, surgical, family and social history reviewed and updated as indicated. Interim medical history since our last visit reviewed. Allergies and medications reviewed and updated.  Review of Systems  Constitutional: Negative.   Respiratory: Negative.   Cardiovascular: Negative.   Psychiatric/Behavioral: Negative.     Per HPI unless specifically indicated above     Objective:    BP 135/82 (BP Location: Left Arm, Patient Position: Sitting, Cuff Size: Large)   Pulse 62   Temp (!) 97.5 F (36.4 C)   Wt 193 lb (87.5 kg)   SpO2 99%   BMI 33.39 kg/m   Wt Readings from Last 3 Encounters:  08/15/16 193 lb (87.5 kg)  05/14/16 191 lb 9.6 oz (86.9 kg)  04/09/16 189 lb (85.7 kg)    Physical Exam  Constitutional: She is oriented to person, place, and time. She appears well-developed and well-nourished. No distress.  HENT:  Head: Normocephalic and atraumatic.  Right Ear: Hearing normal.  Left Ear: Hearing normal.  Nose: Nose normal.  Eyes: Conjunctivae and lids are normal. Right eye exhibits no discharge. Left eye exhibits no discharge. No scleral  icterus.  Cardiovascular: Normal rate, regular rhythm, normal heart sounds and intact distal pulses.  Exam reveals no gallop and no friction rub.   No murmur heard. Pulmonary/Chest: Effort normal and breath sounds normal. No respiratory distress. She has no wheezes. She has no rales. She exhibits no tenderness.  Musculoskeletal: Normal range of motion.  Neurological: She is alert and oriented to person, place, and time.  Skin: Skin is warm, dry and intact. No rash noted. She is not diaphoretic. No erythema. No pallor.  Psychiatric: She has a normal mood and affect. Her speech is normal and behavior is normal. Judgment and thought content normal. Cognition and memory are normal.  Nursing note and vitals reviewed.   Results for orders placed or performed in visit on 04/09/16  Comprehensive metabolic panel  Result Value Ref Range   Glucose 163 (H) 65 - 99 mg/dL   BUN 17 6 - 24 mg/dL   Creatinine, Ser 4.01 0.57 - 1.00 mg/dL   GFR calc non Af Amer 94 >59 mL/min/1.73   GFR calc Af Amer 109 >59 mL/min/1.73   BUN/Creatinine Ratio 23 9 - 23   Sodium 137 134 - 144 mmol/L   Potassium 3.9 3.5 - 5.2 mmol/L   Chloride 98 96 - 106 mmol/L   CO2 21 18 - 29 mmol/L   Calcium 9.5 8.7 - 10.2 mg/dL   Total Protein 7.3 6.0 - 8.5 g/dL   Albumin 4.1 3.5 - 5.5 g/dL   Globulin, Total 3.2 1.5 -  4.5 g/dL   Albumin/Globulin Ratio 1.3 1.2 - 2.2   Bilirubin Total 0.3 0.0 - 1.2 mg/dL   Alkaline Phosphatase 77 39 - 117 IU/L   AST 14 0 - 40 IU/L   ALT 17 0 - 32 IU/L  Lipid Panel w/o Chol/HDL Ratio  Result Value Ref Range   Cholesterol, Total 164 100 - 199 mg/dL   Triglycerides 161277 (H) 0 - 149 mg/dL   HDL 34 (L) >09>39 mg/dL   VLDL Cholesterol Cal 55 (H) 5 - 40 mg/dL   LDL Calculated 75 0 - 99 mg/dL  Microalbumin, Urine Waived  Result Value Ref Range   Microalb, Ur Waived 80 (H) 0 - 19 mg/L   Creatinine, Urine Waived 200 10 - 300 mg/dL   Microalb/Creat Ratio 30-300 (H) <30 mg/g  Hgb A1c w/o eAG  Result Value  Ref Range   Hgb A1c MFr Bld 7.5 (H) 4.8 - 5.6 %      Assessment & Plan:   Problem List Items Addressed This Visit      Endocrine   Type 2 diabetes mellitus (HCC) - Primary    Still not under great control with A1c of 7.4- will add jardiance and recheck 3 months.       Relevant Medications   dapagliflozin propanediol (FARXIGA) 5 MG TABS tablet   Other Relevant Orders   Bayer DCA Hb A1c Waived       Follow up plan: Return in about 3 months (around 11/15/2016) for Physical.

## 2016-08-15 NOTE — Telephone Encounter (Signed)
Routing to provider  

## 2016-08-15 NOTE — Telephone Encounter (Signed)
Patient would like Dr Laural BenesJohnson to send her Lori Horne to [facility] as she says it will be cheaper for her.  [place].  Can she send this script to [facility] for her ASAP  Thank You

## 2016-08-29 ENCOUNTER — Ambulatory Visit (INDEPENDENT_AMBULATORY_CARE_PROVIDER_SITE_OTHER): Payer: 59 | Admitting: Family Medicine

## 2016-08-29 ENCOUNTER — Encounter: Payer: Self-pay | Admitting: Family Medicine

## 2016-08-29 VITALS — BP 128/84 | HR 82 | Temp 97.6°F | Wt 191.6 lb

## 2016-08-29 DIAGNOSIS — M26609 Unspecified temporomandibular joint disorder, unspecified side: Secondary | ICD-10-CM | POA: Diagnosis not present

## 2016-08-29 MED ORDER — CYCLOBENZAPRINE HCL 10 MG PO TABS: 10.0000 mg | ORAL_TABLET | Freq: Every day | ORAL | 0 refills | Status: DC

## 2016-08-29 NOTE — Progress Notes (Signed)
BP 128/84   Pulse 82   Temp 97.6 F (36.4 C)   Wt 191 lb 9.6 oz (86.9 kg)   SpO2 97%   BMI 33.15 kg/m    Subjective:    Patient ID: Lori Horne, female    DOB: 1965-03-13, 51 y.o.   MRN: [id number]  HPI: Lori Horne is a 51 y.o. female  Chief Complaint  Patient presents with  . Ear Pain    pt states her left ear has been hurting for about 3 weeks. States she has also had a headache and a little bit of nasal congestion    UPPER RESPIRATORY TRACT INFECTION Duration: Since about August 9th Worst symptom: L ear pain Fever: no Cough: no Shortness of breath: no Wheezing: no Chest pain: no Chest tightness: no Chest congestion: no Nasal congestion: yes Runny nose: no Post nasal drip: no Sneezing: no Sore throat: no Swollen glands: no Sinus pressure: yes Headache: no Face pain: no Toothache: no Ear pain: yes left Ear pressure: yes left Eyes red/itching:no Eye drainage/crusting: no  Vomiting: no Rash: no Fatigue: no Sick contacts: no Strep contacts: no  Context: worse Recurrent sinusitis: no Relief with OTC cold/cough medications: no  Treatments attempted: none  Relevant past medical, surgical, family and social history reviewed and updated as indicated. Interim medical history since our last visit reviewed. Allergies and medications reviewed and updated.  Review of Systems  Constitutional: Negative.   HENT: Positive for ear pain and hearing loss. Negative for congestion, dental problem, drooling, ear discharge, facial swelling, mouth sores, nosebleeds, postnasal drip, rhinorrhea, sinus pain, sinus pressure, sneezing, sore throat, tinnitus, trouble swallowing and voice change.   Respiratory: Negative.   Cardiovascular: Negative.   Psychiatric/Behavioral: Negative.     Per HPI unless specifically indicated above     Objective:    BP 128/84   Pulse 82   Temp 97.6 F (36.4 C)   Wt 191 lb 9.6 oz (86.9 kg)   SpO2 97%   BMI 33.15 kg/m   Wt Readings  from Last 3 Encounters:  08/29/16 191 lb 9.6 oz (86.9 kg)  08/15/16 193 lb (87.5 kg)  05/14/16 191 lb 9.6 oz (86.9 kg)    Physical Exam  Constitutional: She is oriented to person, place, and time. She appears well-developed and well-nourished. No distress.  HENT:  Head: Normocephalic and atraumatic.  Right Ear: Hearing and external ear normal.  Left Ear: Hearing and external ear normal.  Nose: Nose normal.  Mouth/Throat: Oropharynx is clear and moist. No oropharyngeal exudate.  + jaw click on the L, negative Tinel's over facial nerve on the L  Eyes: Pupils are equal, round, and reactive to light. Conjunctivae, EOM and lids are normal. Right eye exhibits no discharge. Left eye exhibits no discharge. No scleral icterus.  Neck: Normal range of motion. Neck supple. No JVD present. No tracheal deviation present. No thyromegaly present.  Cardiovascular: Normal rate, regular rhythm, normal heart sounds and intact distal pulses.  Exam reveals no gallop and no friction rub.   No murmur heard. Pulmonary/Chest: Effort normal and breath sounds normal. No stridor. No respiratory distress. She has no wheezes. She has no rales. She exhibits no tenderness.  Musculoskeletal: Normal range of motion.  Lymphadenopathy:    She has no cervical adenopathy.  Neurological: She is alert and oriented to person, place, and time.  Skin: Skin is warm, dry and intact. No rash noted. She is not diaphoretic. No erythema. No pallor.  Psychiatric: She has a  normal mood and affect. Her speech is normal and behavior is normal. Judgment and thought content normal. Cognition and memory are normal.  Nursing note and vitals reviewed.   Results for orders placed or performed in visit on 08/15/16  Bayer DCA Hb A1c Waived  Result Value Ref Range   Bayer DCA Hb A1c Waived 7.4 (H) <7.0 %      Assessment & Plan:   Problem List Items Addressed This Visit    None    Visit Diagnoses    TMJ (temporomandibular joint disorder)     -  Primary   Will start on cyclobenzaprine and stretches. Call if not getting better or getting worse.        Follow up plan: Return As scheduled .

## 2016-08-29 NOTE — Patient Instructions (Addendum)
 Jaw Range of Motion Exercises Jaw range of motion exercises are exercises that help your jaw to move better. These exercises can help to prevent:  Difficulty opening your mouth.  Pain in your jaw while it is both open and closed.  What should I be careful of when doing jaw exercises? Make sure that you only do jaw exercises as directed by your health care provider. You should only move your jaw as far as it can go in each direction, if told to do so by your health care provider. Do not move your jaw into positions that cause you any pain. What exercises should I do?  Stick your jaw forward. Hold this position for 1-2 seconds. Allow your jaw to return to its normal position and rest it there for 1-2 seconds. Do this exercise 8 times.  Stand or sit in front of a mirror. Place your tongue on the roof of your mouth, just behind your top teeth. Slowly open and close your jaw, keeping your tongue on the roof of your mouth. While you open and close your mouth, try to keep your jaw from moving toward one side or the other. Repeat this 8 times.  Move your jaw right. Hold this position for 1-2 seconds. Allow your jaw to return to its normal position, and rest it there for 1-2 seconds. Do this exercise 8 times.  Move your jaw left. Hold this position for 1-2 seconds. Allow your jaw to return to its normal position, and rest it there for 1-2 seconds. Do this exercise 8 times.  Open your mouth as far as it is can comfortably go. Hold this position for 1-2 seconds. Then close your mouth and rest for 1-2 seconds. Do this exercise 8 times.  Move your jaw in a circular motion, starting toward the right (clockwise). Repeat this 8 times.  Move your jaw in a circular motion, starting toward the left (counterclockwise). Repeat this 8 times. Apply moist heat packs or ice packs to your jaw before or after performing your exercises as directed by your health care provider. What else can I do? Avoid the  following, if they cause jaw pain or they increase your jaw pain:  Chewing gum.  Clenching your jaw or teeth or keeping tension in your jaw muscles.  Leaning on your jaw, such as resting your jaw in your hand while leaning on a desk.  This information is not intended to replace advice given to you by your health care provider. Make sure you discuss any questions you have with your health care provider. Document Released: 12/07/2007 Document Revised: 06/01/2015 Document Reviewed: 11/24/2013 Elsevier Interactive Patient Education  2018 [employer] Temporomandibular Joint Syndrome Temporomandibular joint (TMJ) syndrome is a condition that affects the joints between your jaw and your skull. The TMJs are located near your ears and allow your jaw to open and close. These joints and the nearby muscles are involved in all movements of the jaw. People with TMJ syndrome have pain in the area of these joints and muscles. Chewing, biting, or other movements of the jaw can be difficult or painful. TMJ syndrome can be caused by various things. In many cases, the condition is mild and goes away within a few weeks. For some people, the condition can become a long-term problem. What are the causes? Possible causes of TMJ syndrome include:  Grinding your teeth or clenching your jaw. Some people do this when they are under stress.  Arthritis.  Injury to the   jaw.  Head or neck injury.  Teeth or dentures that are not aligned well.  In some cases, the cause of TMJ syndrome may not be known. What are the signs or symptoms? The most common symptom is an aching pain on the side of the head in the area of the TMJ. Other symptoms may include:  Pain when moving your jaw, such as when chewing or biting.  Being unable to open your jaw all the way.  Making a clicking sound when you open your mouth.  Headache.  Earache.  Neck or shoulder pain.  How is this diagnosed? Diagnosis can usually be made  based on your symptoms, your medical history, and a physical exam. Your health care provider may check the range of motion of your jaw. Imaging tests, such as X-rays or an MRI, are sometimes done. You may need to see your dentist to determine if your teeth and jaw are lined up correctly. How is this treated? TMJ syndrome often goes away on its own. If treatment is needed, the options may include:  Eating soft foods and applying ice or heat.  Medicines to relieve pain or inflammation.  Medicines to relax the muscles.  A splint, bite plate, or mouthpiece to prevent teeth grinding or jaw clenching.  Relaxation techniques or counseling to help reduce stress.  Transcutaneous electrical nerve stimulation (TENS). This helps to relieve pain by applying an electrical current through the skin.  Acupuncture. This is sometimes helpful to relieve pain.  Jaw surgery. This is rarely needed.  Follow these instructions at home:  Take medicines only as directed by your health care provider.  Eat a soft diet if you are having trouble chewing.  Apply ice to the painful area. ? Put ice in a plastic bag. ? Place a towel between your skin and the bag. ? Leave the ice on for 20 minutes, 2-3 times a day.  Apply a warm compress to the painful area as directed.  Massage your jaw area and perform any jaw stretching exercises as recommended by your health care provider.  If you were given a mouthpiece or bite plate, wear it as directed.  Avoid foods that require a lot of chewing. Do not chew gum.  Keep all follow-up visits as directed by your health care provider. This is important. Contact a health care provider if:  You are having trouble eating.  You have new or worsening symptoms. Get help right away if:  Your jaw locks open or closed. This information is not intended to replace advice given to you by your health care provider. Make sure you discuss any questions you have with your health care  provider. Document Released: 09/18/2000 Document Revised: 08/24/2015 Document Reviewed: 07/29/2013 Elsevier Interactive Patient Education  2018 [employer]  

## 2016-09-16 ENCOUNTER — Telehealth: Payer: Self-pay | Admitting: Family Medicine

## 2016-09-16 NOTE — Telephone Encounter (Signed)
-----   Message from Dorcas CarrowMegan P Giavanni Odonovan, DO sent at 08/15/2016  9:25 AM EDT ----- Check on tolerance to farxiga

## 2016-09-16 NOTE — Telephone Encounter (Signed)
Called to check in on how she's doing. She notes that she's feeling well on it. Sugars running about 120 fasting. Tolerating medicine well.

## 2016-09-26 DIAGNOSIS — Z1231 Encounter for screening mammogram for malignant neoplasm of breast: Secondary | ICD-10-CM | POA: Diagnosis not present

## 2016-09-26 LAB — HM MAMMOGRAPHY

## 2016-10-03 ENCOUNTER — Other Ambulatory Visit: Payer: Self-pay | Admitting: Family Medicine

## 2016-10-11 DIAGNOSIS — M5431 Sciatica, right side: Secondary | ICD-10-CM | POA: Diagnosis not present

## 2016-10-11 DIAGNOSIS — M25551 Pain in right hip: Secondary | ICD-10-CM | POA: Diagnosis not present

## 2016-10-11 DIAGNOSIS — M9903 Segmental and somatic dysfunction of lumbar region: Secondary | ICD-10-CM | POA: Diagnosis not present

## 2016-10-31 ENCOUNTER — Encounter: Payer: Self-pay | Admitting: Family Medicine

## 2016-11-19 ENCOUNTER — Ambulatory Visit: Payer: 59 | Admitting: Family Medicine

## 2016-11-19 ENCOUNTER — Encounter: Payer: Self-pay | Admitting: Family Medicine

## 2016-11-19 VITALS — BP 139/80 | HR 73 | Temp 98.1°F | Ht 64.2 in | Wt 186.5 lb

## 2016-11-19 DIAGNOSIS — Z124 Encounter for screening for malignant neoplasm of cervix: Secondary | ICD-10-CM | POA: Diagnosis not present

## 2016-11-19 DIAGNOSIS — I10 Essential (primary) hypertension: Secondary | ICD-10-CM

## 2016-11-19 DIAGNOSIS — Z0000 Encounter for general adult medical examination without abnormal findings: Secondary | ICD-10-CM | POA: Diagnosis not present

## 2016-11-19 DIAGNOSIS — E785 Hyperlipidemia, unspecified: Secondary | ICD-10-CM

## 2016-11-19 DIAGNOSIS — E1169 Type 2 diabetes mellitus with other specified complication: Secondary | ICD-10-CM | POA: Diagnosis not present

## 2016-11-19 DIAGNOSIS — E1141 Type 2 diabetes mellitus with diabetic mononeuropathy: Secondary | ICD-10-CM

## 2016-11-19 DIAGNOSIS — E1149 Type 2 diabetes mellitus with other diabetic neurological complication: Secondary | ICD-10-CM | POA: Diagnosis not present

## 2016-11-19 LAB — UA/M W/RFLX CULTURE, ROUTINE
Bilirubin, UA: NEGATIVE
KETONES UA: NEGATIVE
Leukocytes, UA: NEGATIVE
NITRITE UA: NEGATIVE
SPEC GRAV UA: 1.015 (ref 1.005–1.030)
UUROB: 0.2 mg/dL (ref 0.2–1.0)
pH, UA: 5.5 (ref 5.0–7.5)

## 2016-11-19 LAB — MICROSCOPIC EXAMINATION
BACTERIA UA: NONE SEEN
RBC, UA: NONE SEEN /hpf (ref 0–?)

## 2016-11-19 LAB — MICROALBUMIN, URINE WAIVED
Creatinine, Urine Waived: 100 mg/dL (ref 10–300)
Microalb, Ur Waived: 80 mg/L — ABNORMAL HIGH (ref 0–19)

## 2016-11-19 LAB — BAYER DCA HB A1C WAIVED: HB A1C (BAYER DCA - WAIVED): 7.2 % — ABNORMAL HIGH (ref <7.0)

## 2016-11-19 MED ORDER — EMPAGLIFLOZIN 25 MG PO TABS: 25.0000 mg | ORAL_TABLET | Freq: Every day | ORAL | 1 refills | Status: DC

## 2016-11-19 MED ORDER — GLIMEPIRIDE 1 MG PO TABS
1.0000 mg | ORAL_TABLET | Freq: Every day | ORAL | 1 refills | Status: DC
Start: 2016-11-19 — End: 2017-05-27

## 2016-11-19 MED ORDER — METFORMIN HCL 500 MG PO TABS: 1000.0000 mg | ORAL_TABLET | Freq: Two times a day (BID) | ORAL | 1 refills | Status: DC

## 2016-11-19 MED ORDER — HYDROCHLOROTHIAZIDE 25 MG PO TABS: 25.0000 mg | ORAL_TABLET | Freq: Every day | ORAL | 1 refills | Status: DC

## 2016-11-19 MED ORDER — LOSARTAN POTASSIUM 50 MG PO TABS: 50.0000 mg | ORAL_TABLET | Freq: Every day | ORAL | 1 refills | Status: DC

## 2016-11-19 MED ORDER — ASPIRIN EC 81 MG PO TBEC: 81.0000 mg | DELAYED_RELEASE_TABLET | Freq: Every day | ORAL | 3 refills | Status: DC

## 2016-11-19 MED ORDER — TRIAMCINOLONE ACETONIDE 0.5 % EX OINT: 1.0000 "application " | TOPICAL_OINTMENT | Freq: Two times a day (BID) | CUTANEOUS | 1 refills | Status: DC

## 2016-11-19 MED ORDER — CYCLOBENZAPRINE HCL 10 MG PO TABS: 10.0000 mg | ORAL_TABLET | Freq: Every day | ORAL | 0 refills | Status: DC

## 2016-11-19 MED ORDER — DULOXETINE HCL 20 MG PO CPEP: 20.0000 mg | ORAL_CAPSULE | Freq: Every day | ORAL | 1 refills | Status: DC

## 2016-11-19 MED ORDER — PRAVASTATIN SODIUM 20 MG PO TABS: 20.0000 mg | ORAL_TABLET | Freq: Every day | ORAL | 1 refills | Status: DC

## 2016-11-19 NOTE — Assessment & Plan Note (Signed)
No issues at this time. Continue to monitor. Call with any concerns.  

## 2016-11-19 NOTE — Progress Notes (Signed)
BP 139/80 (BP Location: Left Arm, Patient Position: Sitting, Cuff Size: Large)   Pulse 73   Temp 98.1 F (36.7 C)   Ht 5' 4.2" (1.631 m)   Wt 186 lb 8 oz (84.6 kg)   SpO2 98%   BMI 31.81 kg/m    Subjective:    Patient ID: Lori Horne, female    DOB: 1965-06-11, 51 y.o.   MRN: [id number]  HPI: Lori Horne is a 51 y.o. female presenting on 11/19/2016 for comprehensive medical examination. Current medical complaints include:  HYPERTENSION / HYPERLIPIDEMIA Satisfied with current treatment? yes Duration of hypertension: chronic BP monitoring frequency: not checking BP medication side effects: no Past BP meds: losartan, hctz Duration of hyperlipidemia: chronic Cholesterol medication side effects: no Cholesterol supplements: none Past cholesterol medications: pravastatin Medication compliance: excellent compliance Aspirin: no Recent stressors: no Recurrent headaches: no Visual changes: yes Palpitations: no Dyspnea: no Chest pain: no Lower extremity edema: no Dizzy/lightheaded: no  DIABETES Hypoglycemic episodes:no Polydipsia/polyuria: no Visual disturbance: yes Chest pain: no Paresthesias: no Glucose Monitoring: yes  Accucheck frequency: 1x a week, or if she needs to  Fasting glucose: 100-120 Taking Insulin?: no Blood Pressure Monitoring: not checking Retinal Examination: Going the 11th of December Foot Exam: Up to Date Diabetic Education: Completed Pneumovax: Refused Influenza: Refused Aspirin: yes  HAND PAIN Duration: chronic Involved hand: left Mechanism of injury: unknown Location: 1st 2 fingers Onset: sudden Severity: moderate  Quality: aching sore Frequency: intermittent Radiation: no Aggravating factors:  Alleviating factors:  Treatments attempted:  Relief with NSAIDs?: No NSAIDs Taken Weakness: no Numbness: no Redness: no Swelling:no Bruising: no Fevers: no  She currently lives with: husband and kids Menopausal Symptoms:  no  Depression Screen done today and results listed below:  Depression screen [facility] 2/9 11/19/2016 08/15/2016 05/14/2016 04/09/2016  Decreased Interest 0 0 0 0  Down, Depressed, Hopeless 0 0 0 1  PHQ - 2 Score 0 0 0 1  Altered sleeping - - 0 -  Tired, decreased energy - - 1 -  Change in appetite - - 0 -  Feeling bad or failure about yourself  - - 0 -  Trouble concentrating - - 0 -  Moving slowly or fidgety/restless - - 0 -  Suicidal thoughts - - 0 -  PHQ-9 Score - - 1 -   Past Medical History:  Past Medical History:  Diagnosis Date  . Cataract   . Diabetes mellitus without complication (HCC)   . Hypertension   . Neuromuscular disorder [facility])     Surgical History:  Past Surgical History:  Procedure Laterality Date  . catarac surgery Left 01/07/2013    Medications:  Current Outpatient Medications on File Prior to Visit  Medication Sig  . Multiple Vitamin (MULTI-VITAMINS) TABS Take by mouth.   No current facility-administered medications on file prior to visit.     Allergies:  No Known Allergies  Social History:  Social History   Socioeconomic History  . Marital status: Married    Spouse name: Not on file  . Number of children: Not on file  . Years of education: Not on file  . Highest education level: Not on file  Social Needs  . Financial resource strain: Not on file  . Food insecurity - worry: Not on file  . Food insecurity - inability: Not on file  . Transportation needs - medical: Not on file  . Transportation needs - non-medical: Not on file  Occupational History  . Not on file  Tobacco  Use  . Smoking status: Never Smoker  . Smokeless tobacco: Never Used  Substance and Sexual Activity  . Alcohol use: Yes    Comment: Occasional glass of wine  . Drug use: No  . Sexual activity: Yes  Other Topics Concern  . Not on file  Social History Narrative  . Not on file   Social History   Tobacco Use  Smoking Status Never Smoker  Smokeless Tobacco Never Used    Social History   Substance and Sexual Activity  Alcohol Use Yes   Comment: Occasional glass of wine    Family History:  Family History  Adopted: Yes    Past medical history, surgical history, medications, allergies, family history and social history reviewed with patient today and changes made to appropriate areas of the chart.   Review of Systems  Constitutional: Negative.   HENT: Negative.   Eyes: Positive for blurred vision. Negative for double vision, photophobia, pain, discharge and redness.  Respiratory: Negative.   Cardiovascular: Negative.   Gastrointestinal: Negative.   Genitourinary: Negative.   Musculoskeletal: Positive for joint pain. Negative for back pain, falls, myalgias and neck pain.  Skin: Negative.     All other ROS negative except what is listed above and in the HPI.      Objective:    BP 139/80 (BP Location: Left Arm, Patient Position: Sitting, Cuff Size: Large)   Pulse 73   Temp 98.1 F (36.7 C)   Ht 5' 4.2" (1.631 m)   Wt 186 lb 8 oz (84.6 kg)   SpO2 98%   BMI 31.81 kg/m   Wt Readings from Last 3 Encounters:  11/19/16 186 lb 8 oz (84.6 kg)  08/29/16 191 lb 9.6 oz (86.9 kg)  08/15/16 193 lb (87.5 kg)    Physical Exam  Constitutional: She is oriented to person, place, and time. She appears well-developed and well-nourished. No distress.  HENT:  Head: Normocephalic and atraumatic.  Right Ear: Hearing and external ear normal.  Left Ear: Hearing and external ear normal.  Nose: Nose normal.  Mouth/Throat: Oropharynx is clear and moist. No oropharyngeal exudate.  Eyes: Conjunctivae, EOM and lids are normal. Pupils are equal, round, and reactive to light. Right eye exhibits no discharge. Left eye exhibits no discharge. No scleral icterus.  Neck: Normal range of motion. Neck supple. No JVD present. No tracheal deviation present. No thyromegaly present.  Cardiovascular: Normal rate, regular rhythm, normal heart sounds and intact distal pulses.  Exam reveals no gallop and no friction rub.  No murmur heard. Pulmonary/Chest: Effort normal and breath sounds normal. No stridor. No respiratory distress. She has no wheezes. She has no rales. She exhibits no tenderness. Right breast exhibits no inverted nipple, no mass, no nipple discharge, no skin change and no tenderness. Left breast exhibits no inverted nipple, no mass, no nipple discharge, no skin change and no tenderness. Breasts are symmetrical.  Abdominal: Soft. Bowel sounds are normal. She exhibits no distension and no mass. There is no tenderness. There is no rebound and no guarding. Hernia confirmed negative in the right inguinal area and confirmed negative in the left inguinal area.  Genitourinary: Vagina normal and uterus normal. No labial fusion. There is no rash, tenderness, lesion or injury on the right labia. There is no rash, tenderness, lesion or injury on the left labia. Uterus is not deviated, not enlarged, not fixed and not tender. Cervix exhibits no motion tenderness, no discharge and no friability. Right adnexum displays no mass, no tenderness  and no fullness. Left adnexum displays no mass, no tenderness and no fullness. No erythema, tenderness or bleeding in the vagina. No foreign body in the vagina. No signs of injury around the vagina. No vaginal discharge found.  Musculoskeletal: Normal range of motion. She exhibits no edema, tenderness or deformity.  Lymphadenopathy:    She has no cervical adenopathy.  Neurological: She is alert and oriented to person, place, and time. She has normal reflexes. She displays normal reflexes. No cranial nerve deficit. She exhibits normal muscle tone. Coordination normal.  Skin: Skin is warm, dry and intact. No rash noted. She is not diaphoretic. No erythema. No pallor.  Psychiatric: She has a normal mood and affect. Her speech is normal and behavior is normal. Judgment and thought content normal. Cognition and memory are normal.  Nursing note  and vitals reviewed.  Diabetic Foot Exam - Simple   Simple Foot Form Diabetic Foot exam was performed with the following findings:  Yes 11/19/2016  9:15 AM  Visual Inspection No deformities, no ulcerations, no other skin breakdown bilaterally:  Yes Sensation Testing Intact to touch and monofilament testing bilaterally:  Yes Pulse Check Posterior Tibialis and Dorsalis pulse intact bilaterally:  Yes Comments     Results for orders placed or performed in visit on 10/09/16  [facility] MAMMOGRAPHY  Result Value Ref Range   HM Mammogram 0-4 Bi-Rad 0-4 Bi-Rad, Self Reported Normal      Assessment & Plan:   Problem List Items Addressed This Visit      Cardiovascular and Mediastinum   Hypertension    Under good control on her current regimen. Continue current regimen. Continue to monitor. Refills given. Call with any concerns.       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 Continue to monitor. Call with any concerns.       Relevant Medications   aspirin EC 81 MG tablet   empagliflozin (JARDIANCE) 25 MG TABS tablet   glimepiride (AMARYL) 1 MG tablet   losartan (COZAAR) 50 MG tablet   metFORMIN (GLUCOPHAGE) 500 MG tablet   pravastatin (PRAVACHOL) 20 MG tablet   Type 2 diabetes mellitus (HCC)    A1c down to 7.2- work on diet and exercise. Recheck 3 months. Call with any concerns. Continue current regimen.      Relevant Medications   aspirin EC 81 MG tablet   empagliflozin (JARDIANCE) 25 MG TABS tablet   glimepiride (AMARYL) 1 MG tablet   losartan (COZAAR) 50 MG tablet   metFORMIN (GLUCOPHAGE) 500 MG tablet    pravastatin (PRAVACHOL) 20 MG tablet   Other Relevant Orders   Bayer DCA Hb A1c Waived   CBC with Differential/Platelet   Comprehensive metabolic panel   Microalbumin, Urine Waived   UA/M w/rflx Culture, Routine   Hyperlipidemia associated with type 2 diabetes mellitus (HCC)    Rechecking levels today. Continue current regimen. Continue to monitor.       Relevant Medications   aspirin EC 81 MG tablet   empagliflozin (JARDIANCE) 25 MG TABS tablet   glimepiride (AMARYL) 1 MG tablet  hydrochlorothiazide (HYDRODIURIL) 25 MG tablet   losartan (COZAAR) 50 MG tablet   metFORMIN (GLUCOPHAGE) 500 MG tablet   pravastatin (PRAVACHOL) 20 MG tablet   Other Relevant Orders   CBC with Differential/Platelet   Comprehensive metabolic panel   Lipid Panel w/o Chol/HDL Ratio   UA/M w/rflx Culture, Routine    Other Visit Diagnoses    Routine general medical examination at a health care facility    -  Primary   Vaccines up to date. Screening labs checked today. Pap done. Mammogram up to date. Colonoscopy up to date. Continue diet and exercise. Call with any concerns.    Screening for cervical cancer       Pap done today. Await results.    Relevant Orders   IGP, Aptima HPV, rfx 16/18,45       Follow up plan: Return in about 3 months (around 02/19/2017) for Dm visit.   LABORATORY TESTING:  - Pap smear: pap done  IMMUNIZATIONS:   - Tdap: Tetanus vaccination status reviewed: Refused. - Influenza: Refused - Pneumovax: Refused - Prevnar: Not applicable - Zostavax vaccine: Refused  SCREENING: -Mammogram: Up to date  - Colonoscopy: Up to date   PATIENT COUNSELING:   Advised to take 1 mg of folate supplement per day if capable of pregnancy.   Sexuality: Discussed sexually transmitted diseases, partner selection, use of condoms, avoidance of unintended pregnancy  and contraceptive alternatives.   Advised to avoid cigarette smoking.  I discussed with the patient that most people either  abstain from alcohol or drink within safe limits (<=14/week and <=4 drinks/occasion for males, <=7/weeks and <= 3 drinks/occasion for females) and that the risk for alcohol disorders and other health effects rises proportionally with the number of drinks per week and how often a drinker exceeds daily limits.  Discussed cessation/primary prevention of drug use and availability of treatment for abuse.   Diet: Encouraged to adjust caloric intake to maintain  or achieve ideal body weight, to reduce intake of dietary saturated fat and total fat, to limit sodium intake by avoiding high sodium foods and not adding table salt, and to maintain adequate dietary potassium and calcium preferably from fresh fruits, vegetables, and low-fat dairy products.    stressed the importance of regular exercise  Injury prevention: Discussed safety belts, safety helmets, smoke detector, smoking near bedding or upholstery.   Dental health: Discussed importance of regular tooth brushing, flossing, and dental visits.    NEXT PREVENTATIVE PHYSICAL DUE IN 1 YEAR. Return in about 3 months (around 02/19/2017) for Dm visit.

## 2016-11-19 NOTE — Patient Instructions (Addendum)
Creams for your hands: Arnica gel, capsaicin cream  Health Maintenance, Female Adopting a healthy lifestyle and getting preventive care can go a long way to promote health and wellness. Talk with your health care provider about what schedule of regular examinations is right for you. This is a good chance for you to check in with your provider about disease prevention and staying healthy. In between checkups, there are plenty of things you can do on your own. Experts have done a lot of research about which lifestyle changes and preventive measures are most likely to keep you healthy. Ask your health care provider for more information. Weight and diet Eat a healthy diet  Be sure to include plenty of vegetables, fruits, low-fat dairy products, and lean protein.  Do not eat a lot of foods high in solid fats, added sugars, or salt.  Get regular exercise. This is one of the most important things you can do for your health. ? Most adults should exercise for at least 150 minutes each week. The exercise should increase your heart rate and make you sweat (moderate-intensity exercise). ? Most adults should also do strengthening exercises at least twice a week. This is in addition to the moderate-intensity exercise.  Maintain a healthy weight  Body mass index (BMI) is a measurement that can be used to identify possible weight problems. It estimates body fat based on height and weight. Your health care provider can help determine your BMI and help you achieve or maintain a healthy weight.  For females 77 years of age and older: ? A BMI below 18.5 is considered underweight. ? A BMI of 18.5 to 24.9 is normal. ? A BMI of 25 to 29.9 is considered overweight. ? A BMI of 30 and above is considered obese.  Watch levels of cholesterol and blood lipids  You should start having your blood tested for lipids and cholesterol at 53 years of age, then have this test every 5 years.  You may need to have your  cholesterol levels checked more often if: ? Your lipid or cholesterol levels are high. ? You are older than 53 years of age. ? You are at high risk for heart disease.  Cancer screening Lung Cancer  Lung cancer screening is recommended for adults 34-45 years old who are at high risk for lung cancer because of a history of smoking.  A yearly low-dose CT scan of the lungs is recommended for people who: ? Currently smoke. ? Have quit within the past 15 years. ? Have at least a 30-pack-year history of smoking. A pack year is smoking an average of one pack of cigarettes a day for 1 year.  Yearly screening should continue until it has been 15 years since you quit.  Yearly screening should stop if you develop a health problem that would prevent you from having lung cancer treatment.  Breast Cancer  Practice breast self-awareness. This means understanding how your breasts normally appear and feel.  It also means doing regular breast self-exams. Let your health care provider know about any changes, no matter how small.  If you are in your 20s or 30s, you should have a clinical breast exam (CBE) by a health care provider every 1-3 years as part of a regular health exam.  If you are 80 or older, have a CBE every year. Also consider having a breast X-ray (mammogram) every year.  If you have a family history of breast cancer, talk to your health care provider about  genetic screening.  If you are at high risk for breast cancer, talk to your health care provider about having an MRI and a mammogram every year.  Breast cancer gene (BRCA) assessment is recommended for women who have family members with BRCA-related cancers. BRCA-related cancers include: ? Breast. ? Ovarian. ? Tubal. ? Peritoneal cancers.  Results of the assessment will determine the need for genetic counseling and BRCA1 and BRCA2 testing.  Cervical Cancer Your health care provider may recommend that you be screened regularly  for cancer of the pelvic organs (ovaries, uterus, and vagina). This screening involves a pelvic examination, including checking for microscopic changes to the surface of your cervix (Pap test). You may be encouraged to have this screening done every 3 years, beginning at age 54.  For women ages 20-65, health care providers may recommend pelvic exams and Pap testing every 3 years, or they may recommend the Pap and pelvic exam, combined with testing for human papilloma virus (HPV), every 5 years. Some types of HPV increase your risk of cervical cancer. Testing for HPV may also be done on women of any age with unclear Pap test results.  Other health care providers may not recommend any screening for nonpregnant women who are considered low risk for pelvic cancer and who do not have symptoms. Ask your health care provider if a screening pelvic exam is right for you.  If you have had past treatment for cervical cancer or a condition that could lead to cancer, you need Pap tests and screening for cancer for at least 20 years after your treatment. If Pap tests have been discontinued, your risk factors (such as having a new sexual partner) need to be reassessed to determine if screening should resume. Some women have medical problems that increase the chance of getting cervical cancer. In these cases, your health care provider may recommend more frequent screening and Pap tests.  Colorectal Cancer  This type of cancer can be detected and often prevented.  Routine colorectal cancer screening usually begins at 53 years of age and continues through 53 years of age.  Your health care provider may recommend screening at an earlier age if you have risk factors for colon cancer.  Your health care provider may also recommend using home test kits to check for hidden blood in the stool.  A small camera at the end of a tube can be used to examine your colon directly (sigmoidoscopy or colonoscopy). This is done to  check for the earliest forms of colorectal cancer.  Routine screening usually begins at age 74.  Direct examination of the colon should be repeated every 5-10 years through 53 years of age. However, you may need to be screened more often if early forms of precancerous polyps or small growths are found.  Skin Cancer  Check your skin from head to toe regularly.  Tell your health care provider about any new moles or changes in moles, especially if there is a change in a mole's shape or color.  Also tell your health care provider if you have a mole that is larger than the size of a pencil eraser.  Always use sunscreen. Apply sunscreen liberally and repeatedly throughout the day.  Protect yourself by wearing long sleeves, pants, a wide-brimmed hat, and sunglasses whenever you are outside.  Heart disease, diabetes, and high blood pressure  High blood pressure causes heart disease and increases the risk of stroke. High blood pressure is more likely to develop in: ? People  who have blood pressure in the high end of the normal range (130-139/85-89 mm Hg). ? People who are overweight or obese. ? People who are African American.  If you are 31-64 years of age, have your blood pressure checked every 3-5 years. If you are 51 years of age or older, have your blood pressure checked every year. You should have your blood pressure measured twice-once when you are at a hospital or clinic, and once when you are not at a hospital or clinic. Record the average of the two measurements. To check your blood pressure when you are not at a hospital or clinic, you can use: ? An automated blood pressure machine at a pharmacy. ? A home blood pressure monitor.  If you are between 11 years and 37 years old, ask your health care provider if you should take aspirin to prevent strokes.  Have regular diabetes screenings. This involves taking a blood sample to check your fasting blood sugar level. ? If you are at a  normal weight and have a low risk for diabetes, have this test once every three years after 53 years of age. ? If you are overweight and have a high risk for diabetes, consider being tested at a younger age or more often. Preventing infection Hepatitis B  If you have a higher risk for hepatitis B, you should be screened for this virus. You are considered at high risk for hepatitis B if: ? You were born in a country where hepatitis B is common. Ask your health care provider which countries are considered high risk. ? Your parents were born in a high-risk country, and you have not been immunized against hepatitis B (hepatitis B vaccine). ? You have HIV or AIDS. ? You use needles to inject street drugs. ? You live with someone who has hepatitis B. ? You have had sex with someone who has hepatitis B. ? You get hemodialysis treatment. ? You take certain medicines for conditions, including cancer, organ transplantation, and autoimmune conditions.  Hepatitis C  Blood testing is recommended for: ? Everyone born from [birth year] through 1967. ? Anyone with known risk factors for hepatitis C.  Sexually transmitted infections (STIs)  You should be screened for sexually transmitted infections (STIs) including gonorrhea and chlamydia if: ? You are sexually active and are younger than 53 years of age. ? You are older than 53 years of age and your health care provider tells you that you are at risk for this type of infection. ? Your sexual activity has changed since you were last screened and you are at an increased risk for chlamydia or gonorrhea. Ask your health care provider if you are at risk.  If you do not have HIV, but are at risk, it may be recommended that you take a prescription medicine daily to prevent HIV infection. This is called pre-exposure prophylaxis (PrEP). You are considered at risk if: ? You are sexually active and do not regularly use condoms or know the HIV status of your  partner(s). ? You take drugs by injection. ? You are sexually active with a partner who has HIV.  Talk with your health care provider about whether you are at high risk of being infected with HIV. If you choose to begin PrEP, you should first be tested for HIV. You should then be tested every 3 months for as long as you are taking PrEP. Pregnancy  If you are premenopausal and you may become pregnant, ask your health care  provider about preconception counseling.  If you may become pregnant, take 400 to 800 micrograms (mcg) of folic acid every day.  If you want to prevent pregnancy, talk to your health care provider about birth control (contraception). Osteoporosis and menopause  Osteoporosis is a disease in which the bones lose minerals and strength with aging. This can result in serious bone fractures. Your risk for osteoporosis can be identified using a bone density scan.  If you are 10 years of age or older, or if you are at risk for osteoporosis and fractures, ask your health care provider if you should be screened.  Ask your health care provider whether you should take a calcium or vitamin D supplement to lower your risk for osteoporosis.  Menopause may have certain physical symptoms and risks.  Hormone replacement therapy may reduce some of these symptoms and risks. Talk to your health care provider about whether hormone replacement therapy is right for you. Follow these instructions at home:  Schedule regular health, dental, and eye exams.  Stay current with your immunizations.  Do not use any tobacco products including cigarettes, chewing tobacco, or electronic cigarettes.  If you are pregnant, do not drink alcohol.  If you are breastfeeding, limit how much and how often you drink alcohol.  Limit alcohol intake to no more than 1 drink per day for nonpregnant women. One drink equals 12 ounces of beer, 5 ounces of wine, or 1 ounces of hard liquor.  Do not use street  drugs.  Do not share needles.  Ask your health care provider for help if you need support or information about quitting drugs.  Tell your health care provider if you often feel depressed.  Tell your health care provider if you have ever been abused or do not feel safe at home. This information is not intended to replace advice given to you by your health care provider. Make sure you discuss any questions you have with your health care provider. Document Released: 07/09/2010 Document Revised: 06/01/2015 Document Reviewed: 09/27/2014 Elsevier Interactive Patient Education  2018 [place] Maintenance for Postmenopausal Women Menopause is a normal process in which your reproductive ability comes to an end. This process happens gradually over a span of months to years, usually between the ages of 86 and 37. Menopause is complete when you have missed 12 consecutive menstrual periods. It is important to talk with your health care provider about some of the most common conditions that affect postmenopausal women, such as heart disease, cancer, and bone loss (osteoporosis). Adopting a healthy lifestyle and getting preventive care can help to promote your health and wellness. Those actions can also lower your chances of developing some of these common conditions. What should I know about menopause? During menopause, you may experience a number of symptoms, such as:  Moderate-to-severe hot flashes.  Night sweats.  Decrease in sex drive.  Mood swings.  Headaches.  Tiredness.  Irritability.  Memory problems.  Insomnia.  Choosing to treat or not to treat menopausal changes is an individual decision that you make with your health care provider. What should I know about hormone replacement therapy and supplements? Hormone therapy products are effective for treating symptoms that are associated with menopause, such as hot flashes and night sweats. Hormone replacement carries  certain risks, especially as you become older. If you are thinking about using estrogen or estrogen with progestin treatments, discuss the benefits and risks with your health care provider. What should I know about heart  disease and stroke? Heart disease, heart attack, and stroke become more likely as you age. This may be due, in part, to the hormonal changes that your body experiences during menopause. These can affect how your body processes dietary fats, triglycerides, and cholesterol. Heart attack and stroke are both medical emergencies. There are many things that you can do to help prevent heart disease and stroke:  Have your blood pressure checked at least every 1-2 years. High blood pressure causes heart disease and increases the risk of stroke.  If you are 57-64 years old, ask your health care provider if you should take aspirin to prevent a heart attack or a stroke.  Do not use any tobacco products, including cigarettes, chewing tobacco, or electronic cigarettes. If you need help quitting, ask your health care provider.  It is important to eat a healthy diet and maintain a healthy weight. ? Be sure to include plenty of vegetables, fruits, low-fat dairy products, and lean protein. ? Avoid eating foods that are high in solid fats, added sugars, or salt (sodium).  Get regular exercise. This is one of the most important things that you can do for your health. ? Try to exercise for at least 150 minutes each week. The type of exercise that you do should increase your heart rate and make you sweat. This is known as moderate-intensity exercise. ? Try to do strengthening exercises at least twice each week. Do these in addition to the moderate-intensity exercise.  Know your numbers.Ask your health care provider to check your cholesterol and your blood glucose. Continue to have your blood tested as directed by your health care provider.  What should I know about cancer screening? There are  several types of cancer. Take the following steps to reduce your risk and to catch any cancer development as early as possible. Breast Cancer  Practice breast self-awareness. ? This means understanding how your breasts normally appear and feel. ? It also means doing regular breast self-exams. Let your health care provider know about any changes, no matter how small.  If you are 54 or older, have a clinician do a breast exam (clinical breast exam or CBE) every year. Depending on your age, family history, and medical history, it may be recommended that you also have a yearly breast X-ray (mammogram).  If you have a family history of breast cancer, talk with your health care provider about genetic screening.  If you are at high risk for breast cancer, talk with your health care provider about having an MRI and a mammogram every year.  Breast cancer (BRCA) gene test is recommended for women who have family members with BRCA-related cancers. Results of the assessment will determine the need for genetic counseling and BRCA1 and for BRCA2 testing. BRCA-related cancers include these types: ? Breast. This occurs in males or females. ? Ovarian. ? Tubal. This may also be called fallopian tube cancer. ? Cancer of the abdominal or pelvic lining (peritoneal cancer). ? Prostate. ? Pancreatic.  Cervical, Uterine, and Ovarian Cancer Your health care provider may recommend that you be screened regularly for cancer of the pelvic organs. These include your ovaries, uterus, and vagina. This screening involves a pelvic exam, which includes checking for microscopic changes to the surface of your cervix (Pap test).  For women ages 21-65, health care providers may recommend a pelvic exam and a Pap test every three years. For women ages 38-65, they may recommend the Pap test and pelvic exam, combined with testing for  human papilloma virus (HPV), every five years. Some types of HPV increase your risk of cervical  cancer. Testing for HPV may also be done on women of any age who have unclear Pap test results.  Other health care providers may not recommend any screening for nonpregnant women who are considered low risk for pelvic cancer and have no symptoms. Ask your health care provider if a screening pelvic exam is right for you.  If you have had past treatment for cervical cancer or a condition that could lead to cancer, you need Pap tests and screening for cancer for at least 20 years after your treatment. If Pap tests have been discontinued for you, your risk factors (such as having a new sexual partner) need to be reassessed to determine if you should start having screenings again. Some women have medical problems that increase the chance of getting cervical cancer. In these cases, your health care provider may recommend that you have screening and Pap tests more often.  If you have a family history of uterine cancer or ovarian cancer, talk with your health care provider about genetic screening.  If you have vaginal bleeding after reaching menopause, tell your health care provider.  There are currently no reliable tests available to screen for ovarian cancer.  Lung Cancer Lung cancer screening is recommended for adults 11-63 years old who are at high risk for lung cancer because of a history of smoking. A yearly low-dose CT scan of the lungs is recommended if you:  Currently smoke.  Have a history of at least 30 pack-years of smoking and you currently smoke or have quit within the past 15 years. A pack-year is smoking an average of one pack of cigarettes per day for one year.  Yearly screening should:  Continue until it has been 15 years since you quit.  Stop if you develop a health problem that would prevent you from having lung cancer treatment.  Colorectal Cancer  This type of cancer can be detected and can often be prevented.  Routine colorectal cancer screening usually begins at age 56  and continues through age 32.  If you have risk factors for colon cancer, your health care provider may recommend that you be screened at an earlier age.  If you have a family history of colorectal cancer, talk with your health care provider about genetic screening.  Your health care provider may also recommend using home test kits to check for hidden blood in your stool.  A small camera at the end of a tube can be used to examine your colon directly (sigmoidoscopy or colonoscopy). This is done to check for the earliest forms of colorectal cancer.  Direct examination of the colon should be repeated every 5-10 years until age 40. However, if early forms of precancerous polyps or small growths are found or if you have a family history or genetic risk for colorectal cancer, you may need to be screened more often.  Skin Cancer  Check your skin from head to toe regularly.  Monitor any moles. Be sure to tell your health care provider: ? About any new moles or changes in moles, especially if there is a change in a mole's shape or color. ? If you have a mole that is larger than the size of a pencil eraser.  If any of your family members has a history of skin cancer, especially at a young age, talk with your health care provider about genetic screening.  Always use sunscreen.  Apply sunscreen liberally and repeatedly throughout the day.  Whenever you are outside, protect yourself by wearing long sleeves, pants, a wide-brimmed hat, and sunglasses.  What should I know about osteoporosis? Osteoporosis is a condition in which bone destruction happens more quickly than new bone creation. After menopause, you may be at an increased risk for osteoporosis. To help prevent osteoporosis or the bone fractures that can happen because of osteoporosis, the following is recommended:  If you are 34-25 years old, get at least 1,000 mg of calcium and at least 600 mg of vitamin D per day.  If you are older than  age 60 but younger than age 41, get at least 1,200 mg of calcium and at least 600 mg of vitamin D per day.  If you are older than age 78, get at least 1,200 mg of calcium and at least 800 mg of vitamin D per day.  Smoking and excessive alcohol intake increase the risk of osteoporosis. Eat foods that are rich in calcium and vitamin D, and do weight-bearing exercises several times each week as directed by your health care provider. What should I know about how menopause affects my mental health? Depression may occur at any age, but it is more common as you become older. Common symptoms of depression include:  Low or sad mood.  Changes in sleep patterns.  Changes in appetite or eating patterns.  Feeling an overall lack of motivation or enjoyment of activities that you previously enjoyed.  Frequent crying spells.  Talk with your health care provider if you think that you are experiencing depression. What should I know about immunizations? It is important that you get and maintain your immunizations. These include:  Tetanus, diphtheria, and pertussis (Tdap) booster vaccine.  Influenza every year before the flu season begins.  Pneumonia vaccine.  Shingles vaccine.  Your health care provider may also recommend other immunizations. This information is not intended to replace advice given to you by your health care provider. Make sure you discuss any questions you have with your health care provider. Document Released: 02/15/2005 Document Revised: 07/14/2015 Document Reviewed: 09/27/2014 Elsevier Interactive Patient Education  2018 [employer].

## 2016-11-19 NOTE — Assessment & Plan Note (Signed)
A1c down to 7.2- work on diet and exercise. Recheck 3 months. Call with any concerns. Continue current regimen.

## 2016-11-19 NOTE — Assessment & Plan Note (Signed)
Under good control on her current regimen. Continue current regimen. Continue to monitor. Refills given. Call with any concerns.

## 2016-11-19 NOTE — Assessment & Plan Note (Signed)
Rechecking levels today. Continue current regimen. Continue to monitor.  

## 2016-11-20 ENCOUNTER — Encounter: Payer: Self-pay | Admitting: Family Medicine

## 2016-11-20 LAB — COMPREHENSIVE METABOLIC PANEL
ALBUMIN: 4.2 g/dL (ref 3.5–5.5)
ALK PHOS: 74 IU/L (ref 39–117)
ALT: 22 IU/L (ref 0–32)
AST: 16 IU/L (ref 0–40)
Albumin/Globulin Ratio: 1.3 (ref 1.2–2.2)
BILIRUBIN TOTAL: 0.3 mg/dL (ref 0.0–1.2)
BUN / CREAT RATIO: 20 (ref 9–23)
BUN: 16 mg/dL (ref 6–24)
CHLORIDE: 98 mmol/L (ref 96–106)
CO2: 25 mmol/L (ref 20–29)
Calcium: 9.7 mg/dL (ref 8.7–10.2)
Creatinine, Ser: 0.81 mg/dL (ref 0.57–1.00)
GFR calc Af Amer: 96 mL/min/{1.73_m2} (ref >59)
GFR calc non Af Amer: 83 mL/min/{1.73_m2} (ref >59)
GLUCOSE: 152 mg/dL — AB (ref 65–99)
Globulin, Total: 3.3 g/dL (ref 1.5–4.5)
Potassium: 4.6 mmol/L (ref 3.5–5.2)
Sodium: 138 mmol/L (ref 134–144)
Total Protein: 7.5 g/dL (ref 6.0–8.5)

## 2016-11-20 LAB — CBC WITH DIFFERENTIAL/PLATELET
BASOS ABS: 0.1 10*3/uL (ref 0.0–0.2)
BASOS: 1 %
EOS (ABSOLUTE): 0.4 10*3/uL (ref 0.0–0.4)
EOS: 5 %
Hematocrit: 43.2 % (ref 34.0–46.6)
Hemoglobin: 14 g/dL (ref 11.1–15.9)
Immature Grans (Abs): 0 10*3/uL (ref 0.0–0.1)
Immature Granulocytes: 0 %
LYMPHS ABS: 2.3 10*3/uL (ref 0.7–3.1)
Lymphs: 29 %
MCH: 29.5 pg (ref 26.6–33.0)
MCHC: 32.4 g/dL (ref 31.5–35.7)
MCV: 91 fL (ref 79–97)
MONOS ABS: 0.7 10*3/uL (ref 0.1–0.9)
Monocytes: 9 %
Neutrophils Absolute: 4.4 10*3/uL (ref 1.4–7.0)
Neutrophils: 56 %
PLATELETS: 354 10*3/uL (ref 150–379)
RBC: 4.75 x10E6/uL (ref 3.77–5.28)
RDW: 13.8 % (ref 12.3–15.4)
WBC: 8 10*3/uL (ref 3.4–10.8)

## 2016-11-20 LAB — LIPID PANEL W/O CHOL/HDL RATIO
Cholesterol, Total: 160 mg/dL (ref 100–199)
HDL: 36 mg/dL — ABNORMAL LOW (ref >39)
LDL Calculated: 75 mg/dL (ref 0–99)
Triglycerides: 243 mg/dL — ABNORMAL HIGH (ref 0–149)
VLDL CHOLESTEROL CAL: 49 mg/dL — AB (ref 5–40)

## 2016-11-20 LAB — TSH: TSH: 1.89 u[IU]/mL (ref 0.450–4.500)

## 2016-11-21 LAB — IGP, APTIMA HPV, RFX 16/18,45
HPV APTIMA: NEGATIVE
PAP Smear Comment: 0

## 2016-12-13 DIAGNOSIS — M25551 Pain in right hip: Secondary | ICD-10-CM | POA: Diagnosis not present

## 2016-12-13 DIAGNOSIS — M5431 Sciatica, right side: Secondary | ICD-10-CM | POA: Diagnosis not present

## 2016-12-13 DIAGNOSIS — M9903 Segmental and somatic dysfunction of lumbar region: Secondary | ICD-10-CM | POA: Diagnosis not present

## 2017-01-09 ENCOUNTER — Encounter: Payer: Self-pay | Admitting: Family Medicine

## 2017-01-09 ENCOUNTER — Ambulatory Visit: Payer: 59 | Admitting: Family Medicine

## 2017-01-09 VITALS — BP 117/74 | HR 74 | Temp 98.0°F | Wt 188.1 lb

## 2017-01-09 DIAGNOSIS — B372 Candidiasis of skin and nail: Secondary | ICD-10-CM | POA: Diagnosis not present

## 2017-01-09 MED ORDER — NYSTATIN 100000 UNIT/GM EX OINT: 1.0000 | TOPICAL_OINTMENT | Freq: Two times a day (BID) | CUTANEOUS | 2 refills | Status: DC

## 2017-01-09 MED ORDER — FLUCONAZOLE 100 MG PO TABS: 100.0000 mg | ORAL_TABLET | Freq: Every day | ORAL | 0 refills | Status: DC

## 2017-01-09 NOTE — Progress Notes (Signed)
BP 117/74 (BP Location: Left Arm, Patient Position: Sitting, Cuff Size: Normal)   Pulse 74   Temp 98 F (36.7 C)   Wt 188 lb 1 oz (85.3 kg)   SpO2 99%   BMI 32.08 kg/m    Subjective:    Patient ID: Lori Horne, female    DOB: 1965/11/21, 52 y.o.   MRN: [id number]  HPI: Lori Horne is a 52 y.o. female  Chief Complaint  Patient presents with  . Vaginal Itching   VAGINAL ITCHING- had a small cut on her vaginal area about 3 weeks ago, no more bleeding just itching and feeling bad Duration: 3 weeks Discharge description: none  Pruritus: yes Dysuria: no Malodorous: no Urinary frequency: no Fevers: no Abdominal pain: no  Sexual activity: monogamous History of sexually transmitted diseases: no Recent antibiotic use: no Context: intermittent- seems to come and go, nothing making better or worse  Treatments attempted: neosporin  Relevant past medical, surgical, family and social history reviewed and updated as indicated. Interim medical history since our last visit reviewed. Allergies and medications reviewed and updated.  Review of Systems  Constitutional: Negative.   Respiratory: Negative.   Cardiovascular: Negative.   Genitourinary: Negative for decreased urine volume, difficulty urinating, dyspareunia, dysuria, enuresis, flank pain, frequency, genital sores, hematuria, menstrual problem, pelvic pain, urgency, vaginal bleeding, vaginal discharge and vaginal pain.  Psychiatric/Behavioral: Negative.     Per HPI unless specifically indicated above     Objective:    BP 117/74 (BP Location: Left Arm, Patient Position: Sitting, Cuff Size: Normal)   Pulse 74   Temp 98 F (36.7 C)   Wt 188 lb 1 oz (85.3 kg)   SpO2 99%   BMI 32.08 kg/m   Wt Readings from Last 3 Encounters:  01/09/17 188 lb 1 oz (85.3 kg)  11/19/16 186 lb 8 oz (84.6 kg)  08/29/16 191 lb 9.6 oz (86.9 kg)    Physical Exam  Constitutional: She is oriented to person, place, and time. She appears  well-developed and well-nourished. No distress.  HENT:  Head: Normocephalic and atraumatic.  Right Ear: Hearing normal.  Left Ear: Hearing normal.  Nose: Nose normal.  Eyes: Conjunctivae and lids are normal. Right eye exhibits no discharge. Left eye exhibits no discharge. No scleral icterus.  Cardiovascular: Normal rate, regular rhythm, normal heart sounds and intact distal pulses. Exam reveals no gallop and no friction rub.  No murmur heard. Pulmonary/Chest: Effort normal and breath sounds normal. No respiratory distress. She has no wheezes. She has no rales. She exhibits no tenderness.  Genitourinary: Vagina normal. No vaginal discharge found.  Musculoskeletal: Normal range of motion.  Neurological: She is alert and oriented to person, place, and time.  Skin: Skin is warm, dry and intact. No rash noted. She is not diaphoretic. There is erythema. No pallor.  Psychiatric: She has a normal mood and affect. Her speech is normal and behavior is normal. Judgment and thought content normal. Cognition and memory are normal.  Nursing note and vitals reviewed.   Results for orders placed or performed in visit on 11/19/16  Microscopic Examination  Result Value Ref Range   WBC, UA 0-5 0 - 5 /hpf   RBC, UA None seen 0 - 2 /hpf   Epithelial Cells (non renal) 0-10 0 - 10 /hpf   Casts Present None seen /lpf   Cast Type White cell casts (A) N/A   Bacteria, UA None seen None seen/Few  Bayer DCA Hb A1c Waived  Result  Value Ref Range   Bayer DCA Hb A1c Waived 7.2 (H) <7.0 %  CBC with Differential/Platelet  Result Value Ref Range   WBC 8.0 3.4 - 10.8 x10E3/uL   RBC 4.75 3.77 - 5.28 x10E6/uL   Hemoglobin 14.0 11.1 - 15.9 g/dL   Hematocrit 16.1 09.6 - 46.6 %   MCV 91 79 - 97 fL   MCH 29.5 26.6 - 33.0 pg   MCHC 32.4 31.5 - 35.7 g/dL   RDW 04.5 40.9 - 81.1 %   Platelets 354 150 - 379 x10E3/uL   Neutrophils 56 Not Estab. %   Lymphs 29 Not Estab. %   Monocytes 9 Not Estab. %   Eos 5 Not Estab. %     Basos 1 Not Estab. %   Neutrophils Absolute 4.4 1.4 - 7.0 x10E3/uL   Lymphocytes Absolute 2.3 0.7 - 3.1 x10E3/uL   Monocytes Absolute 0.7 0.1 - 0.9 x10E3/uL   EOS (ABSOLUTE) 0.4 0.0 - 0.4 x10E3/uL   Basophils Absolute 0.1 0.0 - 0.2 x10E3/uL   Immature Granulocytes 0 Not Estab. %   Immature Grans (Abs) 0.0 0.0 - 0.1 x10E3/uL  Comprehensive metabolic panel  Result Value Ref Range   Glucose 152 (H) 65 - 99 mg/dL   BUN 16 6 - 24 mg/dL   Creatinine, Ser 9.14 0.57 - 1.00 mg/dL   GFR calc non Af Amer 83 >59 mL/min/1.73   GFR calc Af Amer 96 >59 mL/min/1.73   BUN/Creatinine Ratio 20 9 - 23   Sodium 138 134 - 144 mmol/L   Potassium 4.6 3.5 - 5.2 mmol/L   Chloride 98 96 - 106 mmol/L   CO2 25 20 - 29 mmol/L   Calcium 9.7 8.7 - 10.2 mg/dL   Total Protein 7.5 6.0 - 8.5 g/dL   Albumin 4.2 3.5 - 5.5 g/dL   Globulin, Total 3.3 1.5 - 4.5 g/dL   Albumin/Globulin Ratio 1.3 1.2 - 2.2   Bilirubin Total 0.3 0.0 - 1.2 mg/dL   Alkaline Phosphatase 74 39 - 117 IU/L   AST 16 0 - 40 IU/L   ALT 22 0 - 32 IU/L  Lipid Panel w/o Chol/HDL Ratio  Result Value Ref Range   Cholesterol, Total 160 100 - 199 mg/dL   Triglycerides 782 (H) 0 - 149 mg/dL   HDL 36 (L) >95 mg/dL   VLDL Cholesterol Cal 49 (H) 5 - 40 mg/dL   LDL Calculated 75 0 - 99 mg/dL  Microalbumin, Urine Waived  Result Value Ref Range   Microalb, Ur Waived 80 (H) 0 - 19 mg/L   Creatinine, Urine Waived 100 10 - 300 mg/dL   Microalb/Creat Ratio 30-300 (H) <30 mg/g  TSH  Result Value Ref Range   TSH 1.890 0.450 - 4.500 uIU/mL  UA/M w/rflx Culture, Routine  Result Value Ref Range   Specific Gravity, UA 1.015 1.005 - 1.030   pH, UA 5.5 5.0 - 7.5   Color, UA Yellow Yellow   Appearance Ur Clear Clear   Leukocytes, UA Negative Negative   Protein, UA Trace (A) Negative/Trace   Glucose, UA 3+ (A) Negative   Ketones, UA Negative Negative   RBC, UA Trace (A) Negative   Bilirubin, UA Negative Negative   Urobilinogen, Ur 0.2 0.2 - 1.0 mg/dL    Nitrite, UA Negative Negative   Microscopic Examination See below:   IGP, Aptima HPV, rfx 16/18,45  Result Value Ref Range   DIAGNOSIS: Comment    Specimen adequacy: Comment    Clinician  Provided ICD10 Comment    Performed by: Comment    PAP Smear Comment .    Note: Comment    Test Methodology Comment    HPV Aptima Negative Negative      Assessment & Plan:   Problem List Items Addressed This Visit    None    Visit Diagnoses    Candidal intertrigo    -  Primary   Will treat with diflucan and nystatin. Call if not getting better or getting worse.    Relevant Medications   fluconazole (DIFLUCAN) 100 MG tablet   nystatin ointment (MYCOSTATIN)       Follow up plan: Return if symptoms worsen or fail to improve.

## 2017-01-09 NOTE — Patient Instructions (Addendum)

## 2017-01-28 DIAGNOSIS — H43393 Other vitreous opacities, bilateral: Secondary | ICD-10-CM | POA: Diagnosis not present

## 2017-01-28 DIAGNOSIS — Z961 Presence of intraocular lens: Secondary | ICD-10-CM | POA: Diagnosis not present

## 2017-01-28 DIAGNOSIS — H40003 Preglaucoma, unspecified, bilateral: Secondary | ICD-10-CM | POA: Diagnosis not present

## 2017-01-28 DIAGNOSIS — E119 Type 2 diabetes mellitus without complications: Secondary | ICD-10-CM | POA: Diagnosis not present

## 2017-01-28 LAB — HM DIABETES EYE EXAM

## 2017-02-07 DIAGNOSIS — M5431 Sciatica, right side: Secondary | ICD-10-CM | POA: Diagnosis not present

## 2017-02-07 DIAGNOSIS — M9903 Segmental and somatic dysfunction of lumbar region: Secondary | ICD-10-CM | POA: Diagnosis not present

## 2017-02-07 DIAGNOSIS — M25551 Pain in right hip: Secondary | ICD-10-CM | POA: Diagnosis not present

## 2017-02-18 ENCOUNTER — Ambulatory Visit: Payer: 59 | Admitting: Family Medicine

## 2017-02-27 ENCOUNTER — Encounter: Payer: Self-pay | Admitting: Family Medicine

## 2017-02-27 ENCOUNTER — Ambulatory Visit: Payer: 59 | Admitting: Family Medicine

## 2017-02-27 VITALS — BP 123/74 | HR 64 | Temp 97.6°F | Ht 64.0 in | Wt 186.9 lb

## 2017-02-27 DIAGNOSIS — E1149 Type 2 diabetes mellitus with other diabetic neurological complication: Secondary | ICD-10-CM | POA: Diagnosis not present

## 2017-02-27 LAB — BAYER DCA HB A1C WAIVED: HB A1C (BAYER DCA - WAIVED): 6.9 % (ref <7.0)

## 2017-02-27 NOTE — Progress Notes (Signed)
   BP 123/74 (BP Location: Left Arm, Patient Position: Sitting, Cuff Size: Normal)   Pulse 64   Temp 97.6 F (36.4 C) (Oral)   Ht 5\' 4"  (1.626 m)   Wt 186 lb 14.4 oz (84.8 kg)   SpO2 100%   BMI 32.08 kg/m    Subjective:    Patient ID: Lori Horne, female    DOB: [birth year], 52 y.o.   MRN: [id number]  HPI: Lori Horne is a 52 y.o. female  Chief Complaint  Patient presents with  . Diabetes   DIABETES Hypoglycemic episodes:no Polydipsia/polyuria: no Visual disturbance: no Chest pain: no Paresthesias: no Glucose Monitoring: yes  Accucheck frequency: Not Checking Taking Insulin?: no Blood Pressure Monitoring: not checking Retinal Examination: Up to date Foot Exam: Up to Date Diabetic Education: Not Completed Pneumovax: Refused Influenza: Refused Aspirin: yes  Relevant past medical, surgical, family and social history reviewed and updated as indicated. Interim medical history since our last visit reviewed. Allergies and medications reviewed and updated.  Review of Systems  Constitutional: Negative.   Respiratory: Negative.   Cardiovascular: Negative.   Gastrointestinal: Negative.   Psychiatric/Behavioral: Negative.     Per HPI unless specifically indicated above     Objective:    BP 123/74 (BP Location: Left Arm, Patient Position: Sitting, Cuff Size: Normal)   Pulse 64   Temp 97.6 F (36.4 C) (Oral)   Ht 5\' 4"  (1.626 m)   Wt 186 lb 14.4 oz (84.8 kg)   SpO2 100%   BMI 32.08 kg/m   Wt Readings from Last 3 Encounters:  02/27/17 186 lb 14.4 oz (84.8 kg)  01/09/17 188 lb 1 oz (85.3 kg)  11/19/16 186 lb 8 oz (84.6 kg)    Physical Exam  Constitutional: She is oriented to person, place, and time. She appears well-developed and well-nourished. No distress.  HENT:  Head: Normocephalic and atraumatic.  Right Ear: Hearing normal.  Left Ear: Hearing normal.  Nose: Nose normal.  Eyes: Conjunctivae and lids are normal. Right eye exhibits no discharge. Left eye  exhibits no discharge. No scleral icterus.  Cardiovascular: Normal rate, regular rhythm, normal heart sounds and intact distal pulses. Exam reveals no gallop and no friction rub.  No murmur heard. Pulmonary/Chest: Effort normal and breath sounds normal. No respiratory distress. She has no wheezes. She has no rales. She exhibits no tenderness.  Musculoskeletal: Normal range of motion.  Neurological: She is alert and oriented to person, place, and time.  Skin: Skin is warm, dry and intact. No rash noted. She is not diaphoretic. No erythema. No pallor.  Psychiatric: She has a normal mood and affect. Her speech is normal and behavior is normal. Judgment and thought content normal. Cognition and memory are normal.  Nursing note and vitals reviewed.   Results for orders placed or performed in visit on 02/27/17  HM DIABETES EYE EXAM  Result Value Ref Range   HM Diabetic Eye Exam No Retinopathy No Retinopathy      Assessment & Plan:   Problem List Items Addressed This Visit      Endocrine   Type 2 diabetes mellitus (HCC) - Primary    Under good control with A1c of 6.9- continue current regimen. Continue to monitor. Call with any concerns.       Relevant Orders   Bayer DCA Hb A1c Waived       Follow up plan: Return in about 3 months (around 05/27/2017) for BP/cholesterol/DM.

## 2017-02-27 NOTE — Assessment & Plan Note (Signed)
Under good control with A1c of 6.9- continue current regimen. Continue to monitor. Call with any concerns.  

## 2017-03-07 DIAGNOSIS — M5431 Sciatica, right side: Secondary | ICD-10-CM | POA: Diagnosis not present

## 2017-03-07 DIAGNOSIS — M25551 Pain in right hip: Secondary | ICD-10-CM | POA: Diagnosis not present

## 2017-03-07 DIAGNOSIS — M9903 Segmental and somatic dysfunction of lumbar region: Secondary | ICD-10-CM | POA: Diagnosis not present

## 2017-03-12 DIAGNOSIS — H25041 Posterior subcapsular polar age-related cataract, right eye: Secondary | ICD-10-CM | POA: Diagnosis not present

## 2017-03-12 DIAGNOSIS — H26492 Other secondary cataract, left eye: Secondary | ICD-10-CM | POA: Diagnosis not present

## 2017-03-12 DIAGNOSIS — Z961 Presence of intraocular lens: Secondary | ICD-10-CM | POA: Diagnosis not present

## 2017-03-16 ENCOUNTER — Other Ambulatory Visit: Payer: Self-pay | Admitting: Family Medicine

## 2017-03-31 ENCOUNTER — Telehealth: Payer: Self-pay | Admitting: Family Medicine

## 2017-03-31 MED ORDER — METFORMIN HCL 500 MG PO TABS: 1000.0000 mg | ORAL_TABLET | Freq: Two times a day (BID) | ORAL | 1 refills | Status: DC

## 2017-03-31 NOTE — Telephone Encounter (Signed)
Copied from CRM [phone]. Topic: Quick Communication - Rx Refill/Question >> Mar 31, 2017  2:19 PM Diana EvesHoyt, Maryann B wrote: Medication: metFORMIN (GLUCOPHAGE) 500 MG tablet  [facility] calling requesting a verbal authorization to refill. Call back number [phone]. Ref # [id number]  Preferred Pharmacy (with phone number or street name): [facility] - [place], [place] - [address] Agent: Please be advised that RX refills may take up to 3 business days. We ask that you follow-up with your pharmacy.

## 2017-03-31 NOTE — Telephone Encounter (Signed)
Rx resent.

## 2017-05-09 DIAGNOSIS — M25551 Pain in right hip: Secondary | ICD-10-CM | POA: Diagnosis not present

## 2017-05-09 DIAGNOSIS — M5431 Sciatica, right side: Secondary | ICD-10-CM | POA: Diagnosis not present

## 2017-05-09 DIAGNOSIS — M9903 Segmental and somatic dysfunction of lumbar region: Secondary | ICD-10-CM | POA: Diagnosis not present

## 2017-05-27 ENCOUNTER — Encounter: Payer: Self-pay | Admitting: Family Medicine

## 2017-05-27 ENCOUNTER — Ambulatory Visit: Payer: 59 | Admitting: Family Medicine

## 2017-05-27 ENCOUNTER — Telehealth: Payer: Self-pay | Admitting: Family Medicine

## 2017-05-27 VITALS — BP 131/76 | HR 73 | Temp 97.0°F | Wt 186.2 lb

## 2017-05-27 DIAGNOSIS — E1141 Type 2 diabetes mellitus with diabetic mononeuropathy: Secondary | ICD-10-CM

## 2017-05-27 DIAGNOSIS — E785 Hyperlipidemia, unspecified: Secondary | ICD-10-CM | POA: Diagnosis not present

## 2017-05-27 DIAGNOSIS — E1169 Type 2 diabetes mellitus with other specified complication: Secondary | ICD-10-CM

## 2017-05-27 DIAGNOSIS — K649 Unspecified hemorrhoids: Secondary | ICD-10-CM | POA: Diagnosis not present

## 2017-05-27 DIAGNOSIS — E1149 Type 2 diabetes mellitus with other diabetic neurological complication: Secondary | ICD-10-CM | POA: Diagnosis not present

## 2017-05-27 DIAGNOSIS — Z1159 Encounter for screening for other viral diseases: Secondary | ICD-10-CM

## 2017-05-27 DIAGNOSIS — I10 Essential (primary) hypertension: Secondary | ICD-10-CM | POA: Diagnosis not present

## 2017-05-27 DIAGNOSIS — L03011 Cellulitis of right finger: Secondary | ICD-10-CM | POA: Diagnosis not present

## 2017-05-27 DIAGNOSIS — Z114 Encounter for screening for human immunodeficiency virus [HIV]: Secondary | ICD-10-CM

## 2017-05-27 LAB — BAYER DCA HB A1C WAIVED: HB A1C (BAYER DCA - WAIVED): 7.3 % — ABNORMAL HIGH (ref <7.0)

## 2017-05-27 MED ORDER — METFORMIN HCL 500 MG PO TABS: 1000.0000 mg | ORAL_TABLET | Freq: Two times a day (BID) | ORAL | 1 refills | Status: DC

## 2017-05-27 MED ORDER — SULFAMETHOXAZOLE-TRIMETHOPRIM 800-160 MG PO TABS: 1.0000 | ORAL_TABLET | Freq: Two times a day (BID) | ORAL | 0 refills | Status: DC

## 2017-05-27 MED ORDER — HYDROCORTISONE ACETATE 25 MG RE SUPP: 25.0000 mg | Freq: Two times a day (BID) | RECTAL | 6 refills | Status: DC

## 2017-05-27 MED ORDER — GLIMEPIRIDE 1 MG PO TABS: 1.0000 mg | ORAL_TABLET | Freq: Every day | ORAL | 1 refills | Status: DC

## 2017-05-27 MED ORDER — PRAVASTATIN SODIUM 20 MG PO TABS: 20.0000 mg | ORAL_TABLET | Freq: Every day | ORAL | 1 refills | Status: DC

## 2017-05-27 MED ORDER — LOSARTAN POTASSIUM 50 MG PO TABS: 50.0000 mg | ORAL_TABLET | Freq: Every day | ORAL | 1 refills | Status: DC

## 2017-05-27 MED ORDER — HYDROCORTISONE 2.5 % RE CREA: 1.0000 "application " | TOPICAL_CREAM | Freq: Two times a day (BID) | RECTAL | 3 refills | Status: DC

## 2017-05-27 MED ORDER — DULOXETINE HCL 20 MG PO CPEP: 20.0000 mg | ORAL_CAPSULE | Freq: Every day | ORAL | 1 refills | Status: DC

## 2017-05-27 MED ORDER — EMPAGLIFLOZIN 25 MG PO TABS: 25.0000 mg | ORAL_TABLET | Freq: Every day | ORAL | 1 refills | Status: DC

## 2017-05-27 NOTE — Assessment & Plan Note (Signed)
Under good control. Continue current regimen. Continue to monitor. Call with any concerns. 

## 2017-05-27 NOTE — Assessment & Plan Note (Signed)
Under fair control with A1c of 7.3- will really watch her diet. Continue current regimen. Continue to monitor. Call with any concerns.

## 2017-05-27 NOTE — Patient Instructions (Signed)
Paronychia Paronychia is an infection of the skin that surrounds a nail. It usually affects the skin around a fingernail, but it may also occur near a toenail. It often causes pain and swelling around the nail. This condition may come on suddenly or develop over a longer period. In some cases, a collection of pus (abscess) can form near or under the nail. Usually, paronychia is not serious and it clears up with treatment. What are the causes? This condition may be caused by bacteria or fungi. It is commonly caused by either Streptococcus or Staphylococcus bacteria. The bacteria or fungi often cause the infection by getting into the affected area through an opening in the skin, such as a cut or a hangnail. What increases the risk? This condition is more likely to develop in:  People who get their hands wet often, such as those who work as dishwashers, bartenders, or nurses.  People who bite their fingernails or suck their thumbs.  People who trim their nails too short.  People who have hangnails or injured fingertips.  People who get manicures.  People who have diabetes.  What are the signs or symptoms? Symptoms of this condition include:  Redness and swelling of the skin near the nail.  Tenderness around the nail when you touch the area.  Pus-filled bumps under the cuticle. The cuticle is the skin at the base or sides of the nail.  Fluid or pus under the nail.  Throbbing pain in the area.  How is this diagnosed? This condition is usually diagnosed with a physical exam. In some cases, a sample of pus may be taken from an abscess to be tested in a lab. This can help to determine what type of bacteria or fungi is causing the condition. How is this treated? Treatment for this condition depends on the cause and severity of the condition. If the condition is mild, it may clear up on its own in a few days. Your health care provider may recommend soaking the affected area in warm water a  few times a day. When treatment is needed, the options may include:  Antibiotic medicine, if the condition is caused by a bacterial infection.  Antifungal medicine, if the condition is caused by a fungal infection.  Incision and drainage, if an abscess is present. In this procedure, the health care provider will cut open the abscess so the pus can drain out.  Follow these instructions at home:  Soak the affected area in warm water if directed to do so by your health care provider. You may be told to do this for 20 minutes, 2-3 times a day. Keep the area dry in between soakings.  Take medicines only as directed by your health care provider.  If you were prescribed an antibiotic medicine, finish all of it even if you start to feel better.  Keep the affected area clean.  Do not try to drain a fluid-filled bump yourself.  If you will be washing dishes or performing other tasks that require your hands to get wet, wear rubber gloves. You should also wear gloves if your hands might come in contact with irritating substances, such as cleaners or chemicals.  Follow your health care provider's instructions about: ? Wound care. ? Bandage (dressing) changes and removal. Contact a health care provider if:  Your symptoms get worse or do not improve with treatment.  You have a fever or chills.  You have redness spreading from the affected area.  You have continued   or increased fluid, blood, or pus coming from the affected area.  Your finger or knuckle becomes swollen or is difficult to move. This information is not intended to replace advice given to you by your health care provider. Make sure you discuss any questions you have with your health care provider. Document Released: 06/19/2000 Document Revised: 06/01/2015 Document Reviewed: 12/01/2013 Elsevier Interactive Patient Education  2018 [employer] Inc[employer]  

## 2017-05-27 NOTE — Telephone Encounter (Signed)
Rx sent to her pharmacy 

## 2017-05-27 NOTE — Progress Notes (Signed)
BP 131/76 (BP Location: Left Arm, Patient Position: Sitting, Cuff Size: Normal)   Pulse 73   Temp (!) 97 F (36.1 C) (Oral)   Wt 186 lb 3.2 oz (84.5 kg)   SpO2 97%   BMI 31.96 kg/m    Subjective:    Patient ID: Lori Horne, female    DOB: 11-29-1965, 52 y.o.   MRN: [id number]  HPI: Lori Horne is a 52 y.o. female  Chief Complaint  Patient presents with  . Hypertension  . Hyperlipidemia  . Diabetes   HYPERTENSION / HYPERLIPIDEMIA Satisfied with current treatment? yes Duration of hypertension: chronic BP monitoring frequency: not checking BP medication side effects: no Past BP meds: losartan, hctz Duration of hyperlipidemia: chronic Cholesterol medication side effects: yes Cholesterol supplements: none Past cholesterol medications: pravastatin Medication compliance: excellent compliance Aspirin: yes Recent stressors: no Recurrent headaches: no Visual changes: no Palpitations: no Dyspnea: no Chest pain: no Lower extremity edema: no Dizzy/lightheaded: no  DIABETES Hypoglycemic episodes:yes- couple of days at work Polydipsia/polyuria: no Visual disturbance: yes- having surgery next week Chest pain: no Paresthesias: no Glucose Monitoring: yes  Accucheck frequency: Daily- 120s-160s Taking Insulin?: no Blood Pressure Monitoring: not checking Retinal Examination: Up to Date Foot Exam: Up to Date Diabetic Education: Completed Pneumovax: Declined Influenza: Declined Aspirin: yes  Got a hang-nail a couple of weeks ago and now finger is red and swollen. Drained on Sunday  HEMORRHOIDS Duration: weeks Anal fullness: yes Perianal itching/irritation: yes Perianal pain: no Bright red rectal bleeding: yes Amount of blood: spotting Frequency: intermittent Constipation: yes Hard stools: yes Chronic straining/valsava: yes Status: worse Treatments attempted: nothing  Previous hemorrhoids: no  Colonoscopy: yes   Relevant past medical, surgical, family and  social history reviewed and updated as indicated. Interim medical history since our last visit reviewed. Allergies and medications reviewed and updated.  Review of Systems  Constitutional: Negative.   Respiratory: Negative.   Cardiovascular: Negative.   Skin: Positive for wound. Negative for color change, pallor and rash.  Neurological: Negative.   Psychiatric/Behavioral: Negative.     Per HPI unless specifically indicated above     Objective:    BP 131/76 (BP Location: Left Arm, Patient Position: Sitting, Cuff Size: Normal)   Pulse 73   Temp (!) 97 F (36.1 C) (Oral)   Wt 186 lb 3.2 oz (84.5 kg)   SpO2 97%   BMI 31.96 kg/m   Wt Readings from Last 3 Encounters:  05/27/17 186 lb 3.2 oz (84.5 kg)  02/27/17 186 lb 14.4 oz (84.8 kg)  01/09/17 188 lb 1 oz (85.3 kg)    Physical Exam  Constitutional: She is oriented to person, place, and time. She appears well-developed and well-nourished. No distress.  HENT:  Head: Normocephalic and atraumatic.  Right Ear: Hearing normal.  Left Ear: Hearing normal.  Nose: Nose normal.  Eyes: Conjunctivae and lids are normal. Right eye exhibits no discharge. Left eye exhibits no discharge. No scleral icterus.  Cardiovascular: Normal rate, regular rhythm, normal heart sounds and intact distal pulses. Exam reveals no gallop and no friction rub.  No murmur heard. Pulmonary/Chest: Effort normal and breath sounds normal. No stridor. No respiratory distress. She has no wheezes. She has no rales. She exhibits no tenderness.  Musculoskeletal: Normal range of motion.  Neurological: She is alert and oriented to person, place, and time.  Skin: Skin is warm, dry and intact. Capillary refill takes less than 2 seconds. No rash noted. She is not diaphoretic. No erythema. No pallor.  Paronychia on R index finger, not fluctuant  Psychiatric: She has a normal mood and affect. Her speech is normal and behavior is normal. Judgment and thought content normal.  Cognition and memory are normal.  Nursing note and vitals reviewed.   Results for orders placed or performed in visit on 02/27/17  Bayer DCA Hb A1c Waived  Result Value Ref Range   Bayer DCA Hb A1c Waived 6.9 <7.0 %  HM DIABETES EYE EXAM  Result Value Ref Range   HM Diabetic Eye Exam No Retinopathy No Retinopathy      Assessment & Plan:   Problem List Items Addressed This Visit      Cardiovascular and Mediastinum   Hypertension - Primary    Under good control. Continue current regimen. Continue to monitor. Call with any concerns.       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 Call with any concerns.       Relevant Medications   glimepiride (AMARYL) 1 MG tablet   empagliflozin (JARDIANCE) 25 MG TABS tablet   losartan (COZAAR) 50 MG tablet   metFORMIN (GLUCOPHAGE) 500 MG tablet   pravastatin (PRAVACHOL) 20 MG tablet   Type 2 diabetes mellitus (HCC)    Under fair control with A1c of 7.3- will really watch her diet. Continue current regimen. Continue to monitor. Call with any concerns.       Relevant Medications   glimepiride (AMARYL) 1 MG tablet   empagliflozin (JARDIANCE) 25 MG TABS tablet   losartan (COZAAR) 50 MG tablet   metFORMIN (GLUCOPHAGE) 500 MG tablet   pravastatin (PRAVACHOL) 20 MG tablet   Other Relevant Orders   Bayer DCA Hb A1c Waived   Comprehensive metabolic panel   Hyperlipidemia associated with type 2 diabetes mellitus (HCC)    Stable on current regimen. Continue to monitor. Call with any concerns.       Relevant Medications   glimepiride (AMARYL) 1 MG tablet   empagliflozin (JARDIANCE) 25 MG TABS tablet   losartan (COZAAR) 50 MG tablet   metFORMIN (GLUCOPHAGE) 500 MG tablet   pravastatin (PRAVACHOL) 20 MG tablet   Other  Relevant Orders   Comprehensive metabolic panel   Lipid Panel w/o Chol/HDL Ratio    Other Visit Diagnoses    Need for hepatitis C screening test       Labs drawn today. Await results.    Relevant Orders   Hepatitis C Antibody   Screening for HIV without presence of risk factors       Labs drawn today. Await results.    Relevant Orders   HIV antibody   Paronychia of right index finger       Does not seem to need to be drained right now. Will treat with bactrim. Call with any concerns.    Hemorrhoids, unspecified hemorrhoid type       Will treat with anusol. Call with any concerns or if not getting better.    Relevant Medications   losartan (COZAAR) 50 MG tablet   pravastatin (PRAVACHOL) 20 MG tablet       Follow up plan: Return in about 3 months (around 08/27/2017).

## 2017-05-27 NOTE — Telephone Encounter (Signed)
Copied from CRM [id number]. Topic: Quick Communication - See Telephone Encounter >> May 27, 2017  9:32 AM Windy Kalata, NT wrote: CRM for notification. See Telephone encounter for: 05/27/17.  Patient is calling and states that the hydrocortisone (ANUSOL-HC) 25 MG suppository is going to be $200 and she would like the cream called in that her and Dr. Laural Benes discussed.   [facility] - [place], [place] - [address] AT [facility] [address] [place] [address] Phone: [phone] Fax: [phone]

## 2017-05-27 NOTE — Assessment & Plan Note (Signed)
Doing well on her current regimen with her cymbalta. Call with any concerns.

## 2017-05-27 NOTE — Assessment & Plan Note (Signed)
Stable on current regimen. Continue to monitor. Call with any concerns.  

## 2017-05-28 ENCOUNTER — Encounter: Payer: Self-pay | Admitting: Family Medicine

## 2017-05-28 LAB — LIPID PANEL W/O CHOL/HDL RATIO
CHOLESTEROL TOTAL: 161 mg/dL (ref 100–199)
HDL: 32 mg/dL — AB (ref >39)
LDL Calculated: 76 mg/dL (ref 0–99)
TRIGLYCERIDES: 266 mg/dL — AB (ref 0–149)
VLDL CHOLESTEROL CAL: 53 mg/dL — AB (ref 5–40)

## 2017-05-28 LAB — COMPREHENSIVE METABOLIC PANEL
ALBUMIN: 4.2 g/dL (ref 3.5–5.5)
ALK PHOS: 77 IU/L (ref 39–117)
ALT: 14 IU/L (ref 0–32)
AST: 14 IU/L (ref 0–40)
Albumin/Globulin Ratio: 1.4 (ref 1.2–2.2)
BILIRUBIN TOTAL: 0.2 mg/dL (ref 0.0–1.2)
BUN/Creatinine Ratio: 21 (ref 9–23)
BUN: 17 mg/dL (ref 6–24)
CHLORIDE: 99 mmol/L (ref 96–106)
CO2: 21 mmol/L (ref 20–29)
CREATININE: 0.82 mg/dL (ref 0.57–1.00)
Calcium: 9.3 mg/dL (ref 8.7–10.2)
GFR calc Af Amer: 94 mL/min/{1.73_m2} (ref >59)
GFR calc non Af Amer: 81 mL/min/{1.73_m2} (ref >59)
GLUCOSE: 212 mg/dL — AB (ref 65–99)
Globulin, Total: 3.1 g/dL (ref 1.5–4.5)
Potassium: 3.9 mmol/L (ref 3.5–5.2)
Sodium: 139 mmol/L (ref 134–144)
Total Protein: 7.3 g/dL (ref 6.0–8.5)

## 2017-05-28 LAB — HEPATITIS C ANTIBODY: Hep C Virus Ab: <0.1 s/co ratio (ref 0.0–0.9)

## 2017-05-28 LAB — HIV ANTIBODY (ROUTINE TESTING W REFLEX): HIV Screen 4th Generation wRfx: NONREACTIVE

## 2017-06-07 HISTORY — PX: EYE SURGERY: SHX253

## 2017-06-10 ENCOUNTER — Other Ambulatory Visit: Payer: Self-pay | Admitting: Family Medicine

## 2017-06-10 DIAGNOSIS — M5431 Sciatica, right side: Secondary | ICD-10-CM | POA: Diagnosis not present

## 2017-06-10 DIAGNOSIS — M25551 Pain in right hip: Secondary | ICD-10-CM | POA: Diagnosis not present

## 2017-06-10 DIAGNOSIS — M9903 Segmental and somatic dysfunction of lumbar region: Secondary | ICD-10-CM | POA: Diagnosis not present

## 2017-06-12 DIAGNOSIS — Z87891 Personal history of nicotine dependence: Secondary | ICD-10-CM | POA: Diagnosis not present

## 2017-06-12 DIAGNOSIS — H2511 Age-related nuclear cataract, right eye: Secondary | ICD-10-CM | POA: Diagnosis not present

## 2017-06-12 DIAGNOSIS — E119 Type 2 diabetes mellitus without complications: Secondary | ICD-10-CM | POA: Diagnosis not present

## 2017-06-13 ENCOUNTER — Encounter: Payer: Self-pay | Admitting: Family Medicine

## 2017-08-08 DIAGNOSIS — M9903 Segmental and somatic dysfunction of lumbar region: Secondary | ICD-10-CM | POA: Diagnosis not present

## 2017-08-08 DIAGNOSIS — M5431 Sciatica, right side: Secondary | ICD-10-CM | POA: Diagnosis not present

## 2017-08-08 DIAGNOSIS — M25551 Pain in right hip: Secondary | ICD-10-CM | POA: Diagnosis not present

## 2017-08-28 ENCOUNTER — Ambulatory Visit: Payer: 59 | Admitting: Family Medicine

## 2017-09-03 ENCOUNTER — Encounter: Payer: Self-pay | Admitting: Family Medicine

## 2017-09-03 ENCOUNTER — Ambulatory Visit: Payer: 59 | Admitting: Family Medicine

## 2017-09-03 VITALS — BP 138/83 | HR 77 | Temp 97.5°F | Wt 184.1 lb

## 2017-09-03 DIAGNOSIS — E1149 Type 2 diabetes mellitus with other diabetic neurological complication: Secondary | ICD-10-CM | POA: Diagnosis not present

## 2017-09-03 LAB — BAYER DCA HB A1C WAIVED: HB A1C (BAYER DCA - WAIVED): 7.3 % — ABNORMAL HIGH (ref <7.0)

## 2017-09-03 MED ORDER — HYDROCHLOROTHIAZIDE 25 MG PO TABS: 25.0000 mg | ORAL_TABLET | Freq: Every day | ORAL | 1 refills | Status: DC

## 2017-09-03 NOTE — Progress Notes (Signed)
BP 138/83 (BP Location: Left Arm, Patient Position: Sitting, Cuff Size: Normal)   Pulse 77   Temp (!) 97.5 F (36.4 C)   Wt 184 lb 2 oz (83.5 kg)   SpO2 98%   BMI 31.60 kg/m    Subjective:    Patient ID: Lori Horne, female    DOB: 02/07/1965, 52 y.o.   MRN: [id number]  HPI: Lori Horne is a 52 y.o. female  Chief Complaint  Patient presents with  . Diabetes   DIABETES Hypoglycemic episodes:no Polydipsia/polyuria: no Visual disturbance: no Chest pain: no Paresthesias: no Glucose Monitoring: yes Taking Insulin?: no Blood Pressure Monitoring: not checking Retinal Examination: Up to Date Foot Exam: Up to Date Diabetic Education: Completed Pneumovax: Up to Date Influenza: To get in a couple of months Aspirin: no  Relevant past medical, surgical, family and social history reviewed and updated as indicated. Interim medical history since our last visit reviewed. Allergies and medications reviewed and updated.  Review of Systems  Constitutional: Negative.   Respiratory: Negative.   Cardiovascular: Negative.   Psychiatric/Behavioral: Negative.     Per HPI unless specifically indicated above     Objective:    BP 138/83 (BP Location: Left Arm, Patient Position: Sitting, Cuff Size: Normal)   Pulse 77   Temp (!) 97.5 F (36.4 C)   Wt 184 lb 2 oz (83.5 kg)   SpO2 98%   BMI 31.60 kg/m   Wt Readings from Last 3 Encounters:  09/03/17 184 lb 2 oz (83.5 kg)  05/27/17 186 lb 3.2 oz (84.5 kg)  02/27/17 186 lb 14.4 oz (84.8 kg)    Physical Exam  Constitutional: She is oriented to person, place, and time. She appears well-developed and well-nourished. No distress.  HENT:  Head: Normocephalic and atraumatic.  Right Ear: Hearing normal.  Left Ear: Hearing normal.  Nose: Nose normal.  Eyes: Conjunctivae and lids are normal. Right eye exhibits no discharge. Left eye exhibits no discharge. No scleral icterus.  Cardiovascular: Normal rate.  Pulmonary/Chest: Effort  normal. No respiratory distress.  Musculoskeletal: Normal range of motion.  Neurological: She is alert and oriented to person, place, and time.  Skin: Skin is intact. No rash noted. She is not diaphoretic.  Psychiatric: She has a normal mood and affect. Her speech is normal and behavior is normal. Judgment and thought content normal. Cognition and memory are normal.    Results for orders placed or performed in visit on 05/27/17  Bayer DCA Hb A1c Waived  Result Value Ref Range   HB A1C (BAYER DCA - WAIVED) 7.3 (H) <7.0 %  Comprehensive metabolic panel  Result Value Ref Range   Glucose 212 (H) 65 - 99 mg/dL   BUN 17 6 - 24 mg/dL   Creatinine, Ser 8.29 0.57 - 1.00 mg/dL   GFR calc non Af Amer 81 >59 mL/min/1.73   GFR calc Af Amer 94 >59 mL/min/1.73   BUN/Creatinine Ratio 21 9 - 23   Sodium 139 134 - 144 mmol/L   Potassium 3.9 3.5 - 5.2 mmol/L   Chloride 99 96 - 106 mmol/L   CO2 21 20 - 29 mmol/L   Calcium 9.3 8.7 - 10.2 mg/dL   Total Protein 7.3 6.0 - 8.5 g/dL   Albumin 4.2 3.5 - 5.5 g/dL   Globulin, Total 3.1 1.5 - 4.5 g/dL   Albumin/Globulin Ratio 1.4 1.2 - 2.2   Bilirubin Total 0.2 0.0 - 1.2 mg/dL   Alkaline Phosphatase 77 39 - 117 IU/L  AST 14 0 - 40 IU/L   ALT 14 0 - 32 IU/L  Lipid Panel w/o Chol/HDL Ratio  Result Value Ref Range   Cholesterol, Total 161 100 - 199 mg/dL   Triglycerides 147266 (H) 0 - 149 mg/dL   HDL 32 (L) >82>39 mg/dL   VLDL Cholesterol Cal 53 (H) 5 - 40 mg/dL   LDL Calculated 76 0 - 99 mg/dL  Hepatitis C Antibody  Result Value Ref Range   Hep C Virus Ab <0.1 0.0 - 0.9 s/co ratio  HIV antibody  Result Value Ref Range   HIV Screen 4th Generation wRfx Non Reactive Non Reactive      Assessment & Plan:   Problem List Items Addressed This Visit      Endocrine   Type 2 diabetes mellitus (HCC) - Primary    Stable on current regimen with A1c of 7.3. Continue current regimen. Continue to monitor. Call with any concerns.       Relevant Orders   Bayer DCA  Hb A1c Waived       Follow up plan: Return in about 3 months (around 12/04/2017) for Physical .

## 2017-09-03 NOTE — Assessment & Plan Note (Signed)
Stable on current regimen with A1c of 7.3. Continue current regimen. Continue to monitor. Call with any concerns.

## 2017-09-12 DIAGNOSIS — M9903 Segmental and somatic dysfunction of lumbar region: Secondary | ICD-10-CM | POA: Diagnosis not present

## 2017-09-12 DIAGNOSIS — M5431 Sciatica, right side: Secondary | ICD-10-CM | POA: Diagnosis not present

## 2017-09-12 DIAGNOSIS — M25551 Pain in right hip: Secondary | ICD-10-CM | POA: Diagnosis not present

## 2017-10-10 DIAGNOSIS — M9903 Segmental and somatic dysfunction of lumbar region: Secondary | ICD-10-CM | POA: Diagnosis not present

## 2017-10-10 DIAGNOSIS — M25551 Pain in right hip: Secondary | ICD-10-CM | POA: Diagnosis not present

## 2017-10-10 DIAGNOSIS — M5431 Sciatica, right side: Secondary | ICD-10-CM | POA: Diagnosis not present

## 2017-12-08 ENCOUNTER — Ambulatory Visit (INDEPENDENT_AMBULATORY_CARE_PROVIDER_SITE_OTHER): Payer: 59 | Admitting: Family Medicine

## 2017-12-08 ENCOUNTER — Encounter: Payer: Self-pay | Admitting: Family Medicine

## 2017-12-08 VITALS — BP 137/84 | HR 66 | Temp 98.0°F | Ht 64.0 in | Wt 186.2 lb

## 2017-12-08 DIAGNOSIS — E1149 Type 2 diabetes mellitus with other diabetic neurological complication: Secondary | ICD-10-CM | POA: Diagnosis not present

## 2017-12-08 DIAGNOSIS — E1169 Type 2 diabetes mellitus with other specified complication: Secondary | ICD-10-CM

## 2017-12-08 DIAGNOSIS — I10 Essential (primary) hypertension: Secondary | ICD-10-CM | POA: Diagnosis not present

## 2017-12-08 DIAGNOSIS — E785 Hyperlipidemia, unspecified: Secondary | ICD-10-CM

## 2017-12-08 LAB — BAYER DCA HB A1C WAIVED: HB A1C: 7.2 % — AB (ref <7.0)

## 2017-12-08 LAB — LIPID PANEL PICCOLO, WAIVED
CHOLESTEROL PICCOLO, WAIVED: 156 mg/dL (ref <200)
Chol/HDL Ratio Piccolo,Waive: 4.5 mg/dL
HDL CHOL PICCOLO, WAIVED: 34 mg/dL — AB (ref >59)
LDL Chol Calc Piccolo Waived: 60 mg/dL (ref <100)
TRIGLYCERIDES PICCOLO,WAIVED: 308 mg/dL — AB (ref <150)
VLDL Chol Calc Piccolo,Waive: 62 mg/dL — ABNORMAL HIGH (ref <30)

## 2017-12-08 MED ORDER — LOSARTAN POTASSIUM 50 MG PO TABS: 50.0000 mg | ORAL_TABLET | Freq: Every day | ORAL | 1 refills | Status: DC

## 2017-12-08 MED ORDER — PRAVASTATIN SODIUM 20 MG PO TABS: 20.0000 mg | ORAL_TABLET | Freq: Every day | ORAL | 1 refills | Status: DC

## 2017-12-08 MED ORDER — ASPIRIN EC 81 MG PO TBEC
81.0000 mg | DELAYED_RELEASE_TABLET | Freq: Every day | ORAL | 3 refills | Status: DC
Start: 2017-12-08 — End: 2019-04-15

## 2017-12-08 MED ORDER — HYDROCHLOROTHIAZIDE 25 MG PO TABS: 25.0000 mg | ORAL_TABLET | Freq: Every day | ORAL | 1 refills | Status: DC

## 2017-12-08 MED ORDER — METFORMIN HCL 500 MG PO TABS: 1000.0000 mg | ORAL_TABLET | Freq: Two times a day (BID) | ORAL | 1 refills | Status: DC

## 2017-12-08 MED ORDER — GLIMEPIRIDE 1 MG PO TABS: 1.0000 mg | ORAL_TABLET | Freq: Every day | ORAL | 1 refills | Status: DC

## 2017-12-08 NOTE — Assessment & Plan Note (Signed)
Doing well with stable A1c of 7.2- continue current regimen. Continue to monitor. Call with any concerns. Refills given today.

## 2017-12-08 NOTE — Assessment & Plan Note (Signed)
Under good control on current regimen. Continue current regimen. Continue to monitor. Call with any concerns. Refills given. Checking labs today.  

## 2017-12-08 NOTE — Progress Notes (Signed)
BP 137/84   Pulse 66   Temp 98 F (36.7 C) (Oral)   Ht 5\' 4"  (1.626 m)   Wt 186 lb 3.2 oz (84.5 kg)   SpO2 96%   BMI 31.96 kg/m    Subjective:    Patient ID: Lori HampshirePatricia Horne, female    DOB: 27-Jul-1965, 52 y.o.   MRN: [id number]  HPI: Lori Horne is a 52 y.o. female  Chief Complaint  Patient presents with  . Diabetes  . Hyperlipidemia  . Hypertension   DIABETES Hypoglycemic episodes:no Polydipsia/polyuria: no Visual disturbance: no Chest pain: no Paresthesias: no Glucose Monitoring: no  Accucheck frequency: Daily Taking Insulin?: no Blood Pressure Monitoring: not checking Retinal Examination: Up to Date Foot Exam: Done Diabetic Education: Completed Pneumovax: Refused Influenza: Refused Aspirin: yes  HYPERTENSION / HYPERLIPIDEMIA Satisfied with current treatment? yes Duration of hypertension: chronic BP monitoring frequency: not checking BP medication side effects: no Past BP meds:  HCTZ, losartan Duration of hyperlipidemia: chronic Cholesterol medication side effects: no Cholesterol supplements: none Past cholesterol medications: pravastatin Medication compliance: excellent compliance Aspirin: yes Recent stressors: no Recurrent headaches: no Visual changes: no Palpitations: no Dyspnea: no Chest pain: no Lower extremity edema: no Dizzy/lightheaded: no  Relevant past medical, surgical, family and social history reviewed and updated as indicated. Interim medical history since our last visit reviewed. Allergies and medications reviewed and updated.  Review of Systems  Constitutional: Negative.   Respiratory: Negative.   Cardiovascular: Negative.   Skin: Negative.   Psychiatric/Behavioral: Negative.     Per HPI unless specifically indicated above     Objective:    BP 137/84   Pulse 66   Temp 98 F (36.7 C) (Oral)   Ht 5\' 4"  (1.626 m)   Wt 186 lb 3.2 oz (84.5 kg)   SpO2 96%   BMI 31.96 kg/m   Wt Readings from Last 3 Encounters:    12/08/17 186 lb 3.2 oz (84.5 kg)  09/03/17 184 lb 2 oz (83.5 kg)  05/27/17 186 lb 3.2 oz (84.5 kg)    Physical Exam  Constitutional: She is oriented to person, place, and time. She appears well-developed and well-nourished. No distress.  HENT:  Head: Normocephalic and atraumatic.  Right Ear: Hearing normal.  Left Ear: Hearing normal.  Nose: Nose normal.  Eyes: Conjunctivae and lids are normal. Right eye exhibits no discharge. Left eye exhibits no discharge. No scleral icterus.  Cardiovascular: Normal rate, regular rhythm, normal heart sounds and intact distal pulses. Exam reveals no gallop and no friction rub.  No murmur heard. Pulmonary/Chest: Effort normal and breath sounds normal. No stridor. No respiratory distress. She has no wheezes. She has no rales. She exhibits no tenderness.  Musculoskeletal: Normal range of motion.  Neurological: She is alert and oriented to person, place, and time.  Skin: Skin is warm, dry and intact. Capillary refill takes less than 2 seconds. No rash noted. She is not diaphoretic. No erythema. No pallor.  Psychiatric: She has a normal mood and affect. Her speech is normal and behavior is normal. Judgment and thought content normal. Cognition and memory are normal.  Nursing note and vitals reviewed.   Results for orders placed or performed in visit on 09/03/17  Bayer DCA Hb A1c Waived  Result Value Ref Range   HB A1C (BAYER DCA - WAIVED) 7.3 (H) <7.0 %      Assessment & Plan:   Problem List Items Addressed This Visit      Cardiovascular and Mediastinum  Hypertension    Under good control on current regimen. Continue current regimen. Continue to monitor. Call with any concerns. Refills given. Checking labs today.       Relevant Medications   aspirin EC 81 MG tablet   hydrochlorothiazide (HYDRODIURIL) 25 MG tablet   losartan (COZAAR) 50 MG tablet   pravastatin (PRAVACHOL) 20 MG tablet   Other Relevant Orders   Comprehensive metabolic panel      Endocrine   Type 2 diabetes mellitus (HCC) - Primary    Doing well with stable A1c of 7.2- continue current regimen. Continue to monitor. Call with any concerns. Refills given today.      Relevant Medications   aspirin EC 81 MG tablet   glimepiride (AMARYL) 1 MG tablet   losartan (COZAAR) 50 MG tablet   metFORMIN (GLUCOPHAGE) 500 MG tablet   pravastatin (PRAVACHOL) 20 MG tablet   Other Relevant Orders   Bayer DCA Hb A1c Waived   Comprehensive metabolic panel   Hyperlipidemia associated with type 2 diabetes mellitus (HCC)    Under good control on current regimen. Continue current regimen. Continue to monitor. Call with any concerns. Refills given. Checking labs today.      Relevant Medications   aspirin EC 81 MG tablet   glimepiride (AMARYL) 1 MG tablet   losartan (COZAAR) 50 MG tablet   metFORMIN (GLUCOPHAGE) 500 MG tablet   pravastatin (PRAVACHOL) 20 MG tablet   Other Relevant Orders   Comprehensive metabolic panel   Lipid Panel Piccolo, Waived       Follow up plan: Return in about 3 months (around 03/09/2018) for Physical.

## 2017-12-09 LAB — COMPREHENSIVE METABOLIC PANEL
A/G RATIO: 1.4 (ref 1.2–2.2)
ALBUMIN: 4.1 g/dL (ref 3.5–5.5)
ALT: 15 IU/L (ref 0–32)
AST: 17 IU/L (ref 0–40)
Alkaline Phosphatase: 75 IU/L (ref 39–117)
BILIRUBIN TOTAL: 0.3 mg/dL (ref 0.0–1.2)
BUN / CREAT RATIO: 16 (ref 9–23)
BUN: 15 mg/dL (ref 6–24)
CALCIUM: 9.7 mg/dL (ref 8.7–10.2)
CHLORIDE: 99 mmol/L (ref 96–106)
CO2: 21 mmol/L (ref 20–29)
Creatinine, Ser: 0.94 mg/dL (ref 0.57–1.00)
GFR, EST AFRICAN AMERICAN: 80 mL/min/{1.73_m2} (ref >59)
GFR, EST NON AFRICAN AMERICAN: 69 mL/min/{1.73_m2} (ref >59)
GLOBULIN, TOTAL: 3 g/dL (ref 1.5–4.5)
Glucose: 171 mg/dL — ABNORMAL HIGH (ref 65–99)
POTASSIUM: 4.3 mmol/L (ref 3.5–5.2)
SODIUM: 138 mmol/L (ref 134–144)
TOTAL PROTEIN: 7.1 g/dL (ref 6.0–8.5)

## 2017-12-10 ENCOUNTER — Encounter: Payer: Self-pay | Admitting: Family Medicine

## 2017-12-12 DIAGNOSIS — M5431 Sciatica, right side: Secondary | ICD-10-CM | POA: Diagnosis not present

## 2017-12-12 DIAGNOSIS — M25551 Pain in right hip: Secondary | ICD-10-CM | POA: Diagnosis not present

## 2017-12-12 DIAGNOSIS — M9903 Segmental and somatic dysfunction of lumbar region: Secondary | ICD-10-CM | POA: Diagnosis not present

## 2018-02-06 ENCOUNTER — Other Ambulatory Visit: Payer: Self-pay | Admitting: Family Medicine

## 2018-02-07 ENCOUNTER — Other Ambulatory Visit: Payer: Self-pay | Admitting: Family Medicine

## 2018-02-09 NOTE — Telephone Encounter (Signed)
Requested Prescriptions  Pending Prescriptions Disp Refills  . glimepiride (AMARYL) 1 MG tablet [Pharmacy Med Name: GLIMEPIRIDE 1MG  TABLETS] 90 tablet 0    Sig: TAKE 1 TABLET(1 MG) BY MOUTH DAILY WITH BREAKFAST     Endocrinology:  Diabetes - Sulfonylureas Passed - 02/07/2018  3:15 AM      Passed - HBA1C is between 0 and 7.9 and within 180 days    Hemoglobin A1C  Date Value Ref Range Status  11/15/2015 7.4  Final   HB A1C (BAYER DCA - WAIVED)  Date Value Ref Range Status  12/08/2017 7.2 (H) <7.0 % Final    Comment:                                          Diabetic Adult            <7.0                                       Healthy Adult        4.3 - 5.7                                                           (DCCT/NGSP) American Diabetes Association's Summary of Glycemic Recommendations for Adults with Diabetes: Hemoglobin A1c <7.0%. More stringent glycemic goals (A1c <6.0%) may further reduce complications at the cost of increased risk of hypoglycemia.          Passed - Valid encounter within last 6 months    Recent Outpatient Visits          2 months ago Type 2 diabetes mellitus with other neurologic complication, without long-term current use of insulin (HCC)   [facility] [place], Megan P, DO   5 months ago Type 2 diabetes mellitus with other neurologic complication, without long-term current use of insulin (HCC)   [facility] [place], Megan P, DO   8 months ago Essential hypertension   [facility] Johnson, Megan P, DO   11 months ago Type 2 diabetes mellitus with other neurologic complication, without long-term current use of insulin (HCC)   [facility] [place], [place], DO   1 year ago Candidal intertrigo   [facility] [place], [place], [place]      Future Appointments            In 4 weeks Johnson, Megan P, DO [facility], [facility]         . losartan (COZAAR) 50 MG tablet [Pharmacy Med Name:  LOSARTAN 50MG  TABLETS] 90 tablet 1    Sig: TAKE 1 TABLET(50 MG) BY MOUTH DAILY     Cardiovascular:  Angiotensin Receptor Blockers Passed - 02/07/2018  3:15 AM      Passed - Cr in normal range and within 180 days    Creatinine, Ser  Date Value Ref Range Status  12/08/2017 0.94 0.57 - 1.00 mg/dL Final         Passed - K in normal range and within 180 days    Potassium  Date Value Ref Range Status  12/08/2017 4.3 3.5 -  5.2 mmol/L Final         Passed - Patient is not pregnant      Passed - Last BP in normal range    BP Readings from Last 1 Encounters:  12/08/17 137/84         Passed - Valid encounter within last 6 months    Recent Outpatient Visits          2 months ago Type 2 diabetes mellitus with other neurologic complication, without long-term current use of insulin (HCC)   [facility] [place]   5 months ago Type 2 diabetes mellitus with other neurologic complication, without long-term current use of insulin (HCC)   [facility] [place], Megan P, DO   8 months ago Essential hypertension   [facility] Johnson, Megan P, DO   11 months ago Type 2 diabetes mellitus with other neurologic complication, without long-term current use of insulin (HCC)   [facility] [place] [place], [place]   1 year ago Candidal intertrigo   [facility] [place], [place], [place]      Future Appointments            In 4 weeks Laural BenesJohnson, Oralia RudMegan P, [facility]         . pravastatin (PRAVACHOL) 20 MG tablet [Pharmacy Med Name: PRAVASTATIN 20MG  TABLETS] 90 tablet 1    Sig: TAKE 1 TABLET(20 MG) BY MOUTH DAILY     Cardiovascular:  Antilipid - Statins Failed - 02/07/2018  3:15 AM      Failed - HDL in normal range and within 360 days    HDL  Date Value Ref Range Status  05/27/2017 32 (L) >39 mg/dL Final         Failed - Triglycerides in normal range and within 360 days    Triglycerides Piccolo,Waived  Date Value  Ref Range Status  12/08/2017 308 (H) <150 mg/dL Final    Comment:                            Normal                   <150                         Borderline High     150 - 199                         High                200 - 499                         Very High                >499          Passed - Total Cholesterol in normal range and within 360 days    Cholesterol Piccolo, Waived  Date Value Ref Range Status  12/08/2017 156 <200 mg/dL Final    Comment:                            Desirable                <200  Borderline High      200- 239                         High                     >239          Passed - LDL in normal range and within 360 days    LDL Calculated  Date Value Ref Range Status  05/27/2017 76 0 - 99 mg/dL Final         Passed - Patient is not pregnant      Passed - Valid encounter within last 12 months    Recent Outpatient Visits          2 months ago Type 2 diabetes mellitus with other neurologic complication, without long-term current use of insulin (HCC)   [facility] [place], Megan P, DO   5 months ago Type 2 diabetes mellitus with other neurologic complication, without long-term current use of insulin (HCC)   [facility] [place], Megan P, DO   8 months ago Essential hypertension   [facility] Johnson, Megan P, DO   11 months ago Type 2 diabetes mellitus with other neurologic complication, without long-term current use of insulin (HCC)   [facility] [place], [place], DO   1 year ago Candidal intertrigo   [facility] [place], [place], DO      Future Appointments            In 4 weeks Laural Benes, Oralia Rud, DO [facility], [facility]

## 2018-02-19 ENCOUNTER — Other Ambulatory Visit: Payer: Self-pay | Admitting: Family Medicine

## 2018-02-19 NOTE — Telephone Encounter (Signed)
Requested Prescriptions  Pending Prescriptions Disp Refills  . DULoxetine (CYMBALTA) 20 MG capsule [Pharmacy Med Name: DULOXETINE DR 20MG  CAPSULES] 90 capsule 0    Sig: TAKE 1 CAPSULE(20 MG) BY MOUTH DAILY     Psychiatry: Antidepressants - SNRI Passed - 02/19/2018  3:14 AM      Passed - Last BP in normal range    BP Readings from Last 1 Encounters:  12/08/17 137/84         Passed - Valid encounter within last 6 months    Recent Outpatient Visits          2 months ago Type 2 diabetes mellitus with other neurologic complication, without long-term current use of insulin (HCC)   [facility] [place], Megan P, DO   5 months ago Type 2 diabetes mellitus with other neurologic complication, without long-term current use of insulin (HCC)   [facility] [place], Megan P, DO   8 months ago Essential hypertension   [facility] [place], Megan P, DO   11 months ago Type 2 diabetes mellitus with other neurologic complication, without long-term current use of insulin (HCC)   [facility] [place], [place], DO   1 year ago Candidal intertrigo   [facility] [place], [place], DO      Future Appointments            In 2 weeks Laural Benes, Oralia Rud, DO [facility], [facility]

## 2018-03-09 ENCOUNTER — Encounter: Payer: 59 | Admitting: Family Medicine

## 2018-03-10 ENCOUNTER — Ambulatory Visit (INDEPENDENT_AMBULATORY_CARE_PROVIDER_SITE_OTHER): Payer: 59 | Admitting: Family Medicine

## 2018-03-10 ENCOUNTER — Encounter: Payer: Self-pay | Admitting: Family Medicine

## 2018-03-10 VITALS — BP 117/79 | HR 63 | Temp 97.8°F | Ht 64.57 in | Wt 185.0 lb

## 2018-03-10 DIAGNOSIS — E1141 Type 2 diabetes mellitus with diabetic mononeuropathy: Secondary | ICD-10-CM | POA: Diagnosis not present

## 2018-03-10 DIAGNOSIS — Z0000 Encounter for general adult medical examination without abnormal findings: Secondary | ICD-10-CM | POA: Diagnosis not present

## 2018-03-10 DIAGNOSIS — E669 Obesity, unspecified: Secondary | ICD-10-CM | POA: Diagnosis not present

## 2018-03-10 DIAGNOSIS — E1149 Type 2 diabetes mellitus with other diabetic neurological complication: Secondary | ICD-10-CM

## 2018-03-10 DIAGNOSIS — E1169 Type 2 diabetes mellitus with other specified complication: Secondary | ICD-10-CM | POA: Diagnosis not present

## 2018-03-10 DIAGNOSIS — I10 Essential (primary) hypertension: Secondary | ICD-10-CM

## 2018-03-10 DIAGNOSIS — E785 Hyperlipidemia, unspecified: Secondary | ICD-10-CM

## 2018-03-10 DIAGNOSIS — E66811 Obesity, class 1: Secondary | ICD-10-CM | POA: Insufficient documentation

## 2018-03-10 LAB — MICROSCOPIC EXAMINATION
Bacteria, UA: NONE SEEN
WBC, UA: NONE SEEN /HPF (ref 0–5)

## 2018-03-10 LAB — UA/M W/RFLX CULTURE, ROUTINE
Bilirubin, UA: NEGATIVE
Ketones, UA: NEGATIVE
Leukocytes, UA: NEGATIVE
Nitrite, UA: NEGATIVE
PH UA: 6 (ref 5.0–7.5)
Protein, UA: NEGATIVE
Specific Gravity, UA: 1.01 (ref 1.005–1.030)
Urobilinogen, Ur: 1 mg/dL (ref 0.2–1.0)

## 2018-03-10 LAB — BAYER DCA HB A1C WAIVED: HB A1C (BAYER DCA - WAIVED): 7.4 % — ABNORMAL HIGH (ref <7.0)

## 2018-03-10 LAB — MICROALBUMIN, URINE WAIVED
Creatinine, Urine Waived: 50 mg/dL (ref 10–300)
Microalb, Ur Waived: 10 mg/L (ref 0–19)

## 2018-03-10 MED ORDER — HYDROCHLOROTHIAZIDE 25 MG PO TABS: 25.0000 mg | ORAL_TABLET | Freq: Every day | ORAL | 1 refills | Status: DC

## 2018-03-10 MED ORDER — LOSARTAN POTASSIUM 50 MG PO TABS: ORAL_TABLET | ORAL | 1 refills | Status: DC

## 2018-03-10 MED ORDER — GLIMEPIRIDE 1 MG PO TABS: ORAL_TABLET | ORAL | 1 refills | Status: DC

## 2018-03-10 MED ORDER — EMPAGLIFLOZIN 25 MG PO TABS: 25.0000 mg | ORAL_TABLET | Freq: Every day | ORAL | 1 refills | Status: DC

## 2018-03-10 MED ORDER — PRAVASTATIN SODIUM 20 MG PO TABS: ORAL_TABLET | ORAL | 1 refills | Status: DC

## 2018-03-10 MED ORDER — DULOXETINE HCL 20 MG PO CPEP: ORAL_CAPSULE | ORAL | 1 refills | Status: DC

## 2018-03-10 NOTE — Assessment & Plan Note (Signed)
Under good control on current regimen. Continue current regimen. Continue to monitor. Call with any concerns. Refills given. Labs checked today.  

## 2018-03-10 NOTE — Assessment & Plan Note (Signed)
Stable. Continue to monitor. Call with any concerns.  ?

## 2018-03-10 NOTE — Assessment & Plan Note (Signed)
Weight stable. Continue to work on diet and exercise with goal of losing 1-2lbs a week. Call with any concerns.

## 2018-03-10 NOTE — Progress Notes (Addendum)
BP 117/79   Pulse 63   Temp 97.8 F (36.6 C) (Oral)   Ht 5' 4.57" (1.64 m)   Wt 185 lb (83.9 kg)   SpO2 100%   BMI 31.20 kg/m    Subjective:    Patient ID: Lori Horne, female    DOB: [birth year], 53 y.o.   MRN: [id number]  HPI: Lori Horne is a 53 y.o. female presenting on 03/10/2018 for comprehensive medical examination. Current medical complaints include:  DIABETES Hypoglycemic episodes:yes- only in the AM before she eats Polydipsia/polyuria: no Visual disturbance: no Chest pain: no Paresthesias: no Glucose Monitoring: yes  Accucheck frequency: Daily Taking Insulin?: no Blood Pressure Monitoring: not checking Retinal Examination: Up to Date Foot Exam: Up to Date Diabetic Education: Completed Pneumovax: Refused Influenza: Refused Aspirin: yes  HYPERTENSION / HYPERLIPIDEMIA Satisfied with current treatment? yes Duration of hypertension: chronic BP monitoring frequency: not checking BP medication side effects: no Past BP meds: losartan-HCTZ Duration of hyperlipidemia: chronic Cholesterol medication side effects: no Cholesterol supplements: none Past cholesterol medications: pravastatin Medication compliance: excellent compliance Aspirin: yes Recent stressors: no Recurrent headaches: no Visual changes: no Palpitations: no Dyspnea: no Chest pain: no Lower extremity edema: no Dizzy/lightheaded: no  Menopausal Symptoms: no  Depression Screen done today and results listed below:  Depression screen [facility] 2/9 03/10/2018 09/03/2017 11/19/2016 08/15/2016 05/14/2016  Decreased Interest 0 0 0 0 0  Down, Depressed, Hopeless 0 0 0 0 0  PHQ - 2 Score 0 0 0 0 0  Altered sleeping 0 1 - - 0  Tired, decreased energy 1 0 - - 1  Change in appetite 0 0 - - 0  Feeling bad or failure about yourself  0 0 - - 0  Trouble concentrating 0 1 - - 0  Moving slowly or fidgety/restless 0 0 - - 0  Suicidal thoughts 0 0 - - 0  PHQ-9 Score 1 2 - - 1  Difficult doing work/chores Not  difficult at all Not difficult at all - - -    Past Medical History:  Past Medical History:  Diagnosis Date  . Cataract   . Diabetes mellitus without complication (HCC)   . Hypertension   . Neuromuscular disorder [facility])     Surgical History:  Past Surgical History:  Procedure Laterality Date  . catarac surgery Left 01/07/2013  . EYE SURGERY Right 06/2017   Cataract    Medications:  Current Outpatient Medications on File Prior to Visit  Medication Sig  . aspirin EC 81 MG tablet Take 1 tablet (81 mg total) by mouth daily.  . hydrocortisone (ANUSOL-HC) 2.5 % rectal cream Place 1 application rectally 2 (two) times daily.  . hydrocortisone (ANUSOL-HC) 25 MG suppository Place 1 suppository (25 mg total) rectally 2 (two) times daily.  . metFORMIN (GLUCOPHAGE) 500 MG tablet Take 2 tablets (1,000 mg total) by mouth 2 (two) times daily with a meal.  . Multiple Vitamin (MULTI-VITAMINS) TABS Take by mouth.  . triamcinolone ointment (KENALOG) 0.5 % Apply 1 application 2 (two) times daily topically.   No current facility-administered medications on file prior to visit.     Allergies:  No Known Allergies  Social History:  Social History   Socioeconomic History  . Marital status: Married    Spouse name: Not on file  . Number of children: Not on file  . Years of education: Not on file  . Highest education level: Not on file  Occupational History  . Not on file  Social Needs  .  Financial resource strain: Not on file  . Food insecurity:    Worry: Not on file    Inability: Not on file  . Transportation needs:    Medical: Not on file    Non-medical: Not on file  Tobacco Use  . Smoking status: Never Smoker  . Smokeless tobacco: Never Used  Substance and Sexual Activity  . Alcohol use: Yes    Comment: Occasional glass of wine  . Drug use: No  . Sexual activity: Yes  Lifestyle  . Physical activity:    Days per week: Not on file    Minutes per session: Not on file  . Stress:  Not on file  Relationships  . Social connections:    Talks on phone: Not on file    Gets together: Not on file    Attends religious service: Not on file    Active member of club or organization: Not on file    Attends meetings of clubs or organizations: Not on file    Relationship status: Not on file  . Intimate partner violence:    Fear of current or ex partner: Not on file    Emotionally abused: Not on file    Physically abused: Not on file    Forced sexual activity: Not on file  Other Topics Concern  . Not on file  Social History Narrative  . Not on file   Social History   Tobacco Use  Smoking Status Never Smoker  Smokeless Tobacco Never Used   Social History   Substance and Sexual Activity  Alcohol Use Yes   Comment: Occasional glass of wine    Family History:  Family History  Adopted: Yes    Past medical history, surgical history, medications, allergies, family history and social history reviewed with patient today and changes made to appropriate areas of the chart.   Review of Systems  Constitutional: Negative.   HENT: Negative.   Eyes: Negative.   Respiratory: Negative.   Cardiovascular: Negative.   Gastrointestinal: Positive for heartburn (with food choices). Negative for abdominal pain, blood in stool, constipation, diarrhea, melena, nausea and vomiting.  Genitourinary: Negative.   Musculoskeletal: Negative.   Skin: Negative.   Neurological: Negative.   Endo/Heme/Allergies: Positive for environmental allergies. Negative for polydipsia. Does not bruise/bleed easily.  Psychiatric/Behavioral: Negative.    All other ROS negative except what is listed above and in the HPI.      Objective:    BP 117/79   Pulse 63   Temp 97.8 F (36.6 C) (Oral)   Ht 5' 4.57" (1.64 m)   Wt 185 lb (83.9 kg)   SpO2 100%   BMI 31.20 kg/m   Wt Readings from Last 3 Encounters:  03/10/18 185 lb (83.9 kg)  12/08/17 186 lb 3.2 oz (84.5 kg)  09/03/17 184 lb 2 oz (83.5 kg)     Physical Exam Vitals signs and nursing note reviewed.  Constitutional:      General: She is not in acute distress.    Appearance: Normal appearance. She is not ill-appearing, toxic-appearing or diaphoretic.  HENT:     Head: Normocephalic and atraumatic.     Right Ear: Tympanic membrane, ear canal and external ear normal. There is no impacted cerumen.     Left Ear: Tympanic membrane, ear canal and external ear normal. There is no impacted cerumen.     Nose: Nose normal. No congestion or rhinorrhea.     Mouth/Throat:     Mouth: Mucous membranes are moist.  Pharynx: Oropharynx is clear. No oropharyngeal exudate or posterior oropharyngeal erythema.  Eyes:     General: No scleral icterus.       Right eye: No discharge.        Left eye: No discharge.     Extraocular Movements: Extraocular movements intact.     Conjunctiva/sclera: Conjunctivae normal.     Pupils: Pupils are equal, round, and reactive to light.  Neck:     Musculoskeletal: Normal range of motion and neck supple. No neck rigidity or muscular tenderness.     Vascular: No carotid bruit.  Cardiovascular:     Rate and Rhythm: Normal rate and regular rhythm.     Pulses: Normal pulses.     Heart sounds: No murmur. No friction rub. No gallop.   Pulmonary:     Effort: Pulmonary effort is normal. No respiratory distress.     Breath sounds: Normal breath sounds. No stridor. No wheezing, rhonchi or rales.  Chest:     Chest wall: No tenderness.  Abdominal:     General: Abdomen is flat. Bowel sounds are normal. There is no distension.     Palpations: Abdomen is soft. There is no mass.     Tenderness: There is no abdominal tenderness. There is no right CVA tenderness, left CVA tenderness, guarding or rebound.     Hernia: No hernia is present.  Genitourinary:    Comments: Breast and pelvic exams deferred with shared decision making Musculoskeletal:        General: No swelling, tenderness, deformity or signs of injury.      Right lower leg: No edema.     Left lower leg: No edema.  Lymphadenopathy:     Cervical: No cervical adenopathy.  Skin:    General: Skin is warm and dry.     Capillary Refill: Capillary refill takes less than 2 seconds.     Coloration: Skin is not jaundiced or pale.     Findings: No bruising, erythema, lesion or rash.  Neurological:     General: No focal deficit present.     Mental Status: She is alert and oriented to person, place, and time. Mental status is at baseline.     Cranial Nerves: No cranial nerve deficit.     Sensory: No sensory deficit.     Motor: No weakness.     Coordination: Coordination normal.     Gait: Gait normal.     Deep Tendon Reflexes: Reflexes normal.  Psychiatric:        Mood and Affect: Mood normal.        Behavior: Behavior normal.        Thought Content: Thought content normal.        Judgment: Judgment normal.     Results for orders placed or performed in visit on 12/08/17  Bayer DCA Hb A1c Waived  Result Value Ref Range   HB A1C (BAYER DCA - WAIVED) 7.2 (H) <7.0 %  Comprehensive metabolic panel  Result Value Ref Range   Glucose 171 (H) 65 - 99 mg/dL   BUN 15 6 - 24 mg/dL   Creatinine, Ser 4.07 0.57 - 1.00 mg/dL   GFR calc non Af Amer 69 >59 mL/min/1.73   GFR calc Af Amer 80 >59 mL/min/1.73   BUN/Creatinine Ratio 16 9 - 23   Sodium 138 134 - 144 mmol/L   Potassium 4.3 3.5 - 5.2 mmol/L   Chloride 99 96 - 106 mmol/L   CO2 21 20 - 29 mmol/L  Calcium 9.7 8.7 - 10.2 mg/dL   Total Protein 7.1 6.0 - 8.5 g/dL   Albumin 4.1 3.5 - 5.5 g/dL   Globulin, Total 3.0 1.5 - 4.5 g/dL   Albumin/Globulin Ratio 1.4 1.2 - 2.2   Bilirubin Total 0.3 0.0 - 1.2 mg/dL   Alkaline Phosphatase 75 39 - 117 IU/L   AST 17 0 - 40 IU/L   ALT 15 0 - 32 IU/L  Lipid Panel Piccolo, Waived  Result Value Ref Range   Cholesterol Piccolo, Waived 156 <200 mg/dL   HDL Chol Piccolo, Waived 34 (L) >59 mg/dL   Triglycerides Piccolo,Waived 308 (H) <150 mg/dL   Chol/HDL Ratio  Piccolo,Waive 4.5 mg/dL   LDL Chol Calc Piccolo Waived 60 <100 mg/dL   VLDL Chol Calc Piccolo,Waive 62 (H) <30 mg/dL      Assessment & Plan:   Problem List Items Addressed This Visit      Cardiovascular and Mediastinum   Hypertension    Under good control on current regimen. Continue current regimen. Continue to monitor. Call with any concerns. Refills given. Labs checked today.       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 Continue to monitor. Call with any concerns.       Relevant Medications   losartan (COZAAR) 50 MG tablet   empagliflozin (JARDIANCE) 25 MG TABS tablet   glimepiride (AMARYL) 1 MG tablet   pravastatin (PRAVACHOL) 20 MG tablet   Type 2 diabetes mellitus (HCC)    Up to 7.4 from 7.2- continue current regimen and really watch diet. Recheck 3 months.       Relevant Medications   losartan (COZAAR) 50 MG tablet   empagliflozin (JARDIANCE) 25 MG TABS tablet   glimepiride (AMARYL) 1 MG tablet   pravastatin (PRAVACHOL) 20 MG tablet   Other Relevant Orders   Bayer DCA Hb A1c Waived   CBC with Differential/Platelet   Comprehensive metabolic panel   Microalbumin, Urine Waived   TSH   UA/M w/rflx Culture, Routine   Hyperlipidemia associated with type 2 diabetes mellitus (HCC)    Under good control on current regimen. Continue current regimen. Continue to monitor. Call with any concerns. Refills given. Labs checked today.      Relevant Medications   losartan (COZAAR) 50 MG tablet   empagliflozin (JARDIANCE) 25 MG TABS tablet   glimepiride (AMARYL) 1 MG tablet   pravastatin (PRAVACHOL) 20 MG tablet   Other  Relevant Orders   Bayer DCA Hb A1c Waived   CBC with Differential/Platelet   Comprehensive metabolic panel   Lipid Panel w/o Chol/HDL Ratio   UA/M w/rflx Culture, Routine     Other   Obesity (BMI 30.0-34.9)    Weight stable. Continue to work on diet and exercise with goal of losing 1-2lbs a week. Call with any concerns.        Other Visit Diagnoses    Routine general medical examination at a health care facility    -  Primary   Vaccines declined. Screening labs checked today. Pap up to date. Mammogram ordered. Colonoscopy up to date. Continue diet  and exercise. Call with any concerns.        Follow up plan: Return in about 3 months (around 06/10/2018) for follow up sugars.   LABORATORY TESTING:  - Pap smear: up to date  IMMUNIZATIONS:   - Tdap: Tetanus vaccination status reviewed: Refused. - Influenza: Refused - Pneumovax: Refused  SCREENING: -Mammogram: Ordered today  - Colonoscopy: Up to date   PATIENT COUNSELING:   Advised to take 1 mg of folate supplement per day if capable of pregnancy.   Sexuality: Discussed sexually transmitted diseases, partner selection, use of condoms, avoidance of unintended pregnancy  and contraceptive alternatives.   Advised to avoid cigarette smoking.  I discussed with the patient that most people either abstain from alcohol or drink within safe limits (<=14/week and <=4 drinks/occasion for males, <=7/weeks and <= 3 drinks/occasion for females) and that the risk for alcohol disorders and other health effects rises proportionally with the number of drinks per week and how often a drinker exceeds daily limits.  Discussed cessation/primary prevention of drug use and availability of treatment for abuse.   Diet: Encouraged to adjust caloric intake to maintain  or achieve ideal body weight, to reduce intake of dietary saturated fat and total fat, to limit sodium intake by avoiding high sodium foods and not adding table salt, and to maintain  adequate dietary potassium and calcium preferably from fresh fruits, vegetables, and low-fat dairy products.    stressed the importance of regular exercise  Injury prevention: Discussed safety belts, safety helmets, smoke detector, smoking near bedding or upholstery.   Dental health: Discussed importance of regular tooth brushing, flossing, and dental visits.    NEXT PREVENTATIVE PHYSICAL DUE IN 1 YEAR. Return in about 3 months (around 06/10/2018) for follow up sugars.

## 2018-03-10 NOTE — Assessment & Plan Note (Signed)
Up to 7.4 from 7.2- continue current regimen and really watch diet. Recheck 3 months.

## 2018-03-11 ENCOUNTER — Encounter: Payer: Self-pay | Admitting: Family Medicine

## 2018-03-11 LAB — COMPREHENSIVE METABOLIC PANEL
A/G RATIO: 1.5 (ref 1.2–2.2)
ALK PHOS: 82 IU/L (ref 39–117)
ALT: 16 IU/L (ref 0–32)
AST: 16 IU/L (ref 0–40)
Albumin: 4.5 g/dL (ref 3.8–4.9)
BILIRUBIN TOTAL: 0.5 mg/dL (ref 0.0–1.2)
BUN/Creatinine Ratio: 17 (ref 9–23)
BUN: 14 mg/dL (ref 6–24)
CO2: 22 mmol/L (ref 20–29)
Calcium: 9.6 mg/dL (ref 8.7–10.2)
Chloride: 96 mmol/L (ref 96–106)
Creatinine, Ser: 0.82 mg/dL (ref 0.57–1.00)
GFR calc Af Amer: 93 mL/min/{1.73_m2} (ref >59)
GFR calc non Af Amer: 81 mL/min/{1.73_m2} (ref >59)
Globulin, Total: 3 g/dL (ref 1.5–4.5)
Glucose: 137 mg/dL — ABNORMAL HIGH (ref 65–99)
Potassium: 3.7 mmol/L (ref 3.5–5.2)
SODIUM: 137 mmol/L (ref 134–144)
Total Protein: 7.5 g/dL (ref 6.0–8.5)

## 2018-03-11 LAB — CBC WITH DIFFERENTIAL/PLATELET
Basophils Absolute: 0.1 10*3/uL (ref 0.0–0.2)
Basos: 1 %
EOS (ABSOLUTE): 0.6 10*3/uL — AB (ref 0.0–0.4)
Eos: 9 %
Hematocrit: 46.3 % (ref 34.0–46.6)
Hemoglobin: 15.3 g/dL (ref 11.1–15.9)
Immature Grans (Abs): 0 10*3/uL (ref 0.0–0.1)
Immature Granulocytes: 0 %
Lymphocytes Absolute: 1.8 10*3/uL (ref 0.7–3.1)
Lymphs: 24 %
MCH: 29.5 pg (ref 26.6–33.0)
MCHC: 33 g/dL (ref 31.5–35.7)
MCV: 89 fL (ref 79–97)
Monocytes Absolute: 0.6 10*3/uL (ref 0.1–0.9)
Monocytes: 8 %
Neutrophils Absolute: 4.4 10*3/uL (ref 1.4–7.0)
Neutrophils: 58 %
PLATELETS: 327 10*3/uL (ref 150–450)
RBC: 5.18 x10E6/uL (ref 3.77–5.28)
RDW: 13.2 % (ref 11.7–15.4)
WBC: 7.4 10*3/uL (ref 3.4–10.8)

## 2018-03-11 LAB — LIPID PANEL W/O CHOL/HDL RATIO
Cholesterol, Total: 176 mg/dL (ref 100–199)
HDL: 36 mg/dL — ABNORMAL LOW (ref >39)
LDL Calculated: 92 mg/dL (ref 0–99)
Triglycerides: 242 mg/dL — ABNORMAL HIGH (ref 0–149)
VLDL Cholesterol Cal: 48 mg/dL — ABNORMAL HIGH (ref 5–40)

## 2018-03-11 LAB — TSH: TSH: 1.26 u[IU]/mL (ref 0.450–4.500)

## 2018-05-08 DIAGNOSIS — M5431 Sciatica, right side: Secondary | ICD-10-CM | POA: Diagnosis not present

## 2018-05-08 DIAGNOSIS — M9903 Segmental and somatic dysfunction of lumbar region: Secondary | ICD-10-CM | POA: Diagnosis not present

## 2018-05-08 DIAGNOSIS — M25551 Pain in right hip: Secondary | ICD-10-CM | POA: Diagnosis not present

## 2018-05-11 ENCOUNTER — Other Ambulatory Visit: Payer: Self-pay | Admitting: Family Medicine

## 2018-06-11 ENCOUNTER — Other Ambulatory Visit: Payer: Self-pay | Admitting: Family Medicine

## 2018-06-17 ENCOUNTER — Ambulatory Visit: Payer: 59 | Admitting: Family Medicine

## 2018-06-18 ENCOUNTER — Ambulatory Visit (INDEPENDENT_AMBULATORY_CARE_PROVIDER_SITE_OTHER): Payer: 59 | Admitting: Family Medicine

## 2018-06-18 ENCOUNTER — Encounter: Payer: Self-pay | Admitting: Family Medicine

## 2018-06-18 ENCOUNTER — Other Ambulatory Visit: Payer: Self-pay

## 2018-06-18 VITALS — Ht 63.0 in

## 2018-06-18 DIAGNOSIS — E1149 Type 2 diabetes mellitus with other diabetic neurological complication: Secondary | ICD-10-CM | POA: Diagnosis not present

## 2018-06-18 DIAGNOSIS — M67432 Ganglion, left wrist: Secondary | ICD-10-CM | POA: Diagnosis not present

## 2018-06-18 NOTE — Progress Notes (Addendum)
Ht 5\' 3"  (1.6 m)   BMI 32.77 kg/m    Subjective:    Patient ID: Lori Horne, female    DOB: 1965/08/11, 53 y.o.   MRN: [id number]  HPI: Lori Horne is a 53 y.o. female  Chief Complaint  Patient presents with  . Diabetes    f/u   DIABETES Hypoglycemic episodes:no Polydipsia/polyuria: no Visual disturbance: no Chest pain: no Paresthesias: no Glucose Monitoring: yes  Accucheck frequency: Daily  Fasting glucose: 120-160s Taking Insulin?: no Blood Pressure Monitoring: not checking Retinal Examination: Up to Date Foot Exam: Up to Date Diabetic Education: Completed Pneumovax: Not up to Date Influenza: postponed to flu season Aspirin: no   Has a cyst on her wrist- starting to bother her at work. Has been painful and swollen. No other concerns or complaints at this time.   Relevant past medical, surgical, family and social history reviewed and updated as indicated. Interim medical history since our last visit reviewed. Allergies and medications reviewed and updated.  Review of Systems  Constitutional: Negative.   Respiratory: Negative.   Cardiovascular: Negative.   Musculoskeletal: Negative.   Neurological: Negative.   Psychiatric/Behavioral: Negative.     Per HPI unless specifically indicated above     Objective:    Ht 5\' 3"  (1.6 m)   BMI 32.77 kg/m   Wt Readings from Last 3 Encounters:  03/10/18 185 lb (83.9 kg)  12/08/17 186 lb 3.2 oz (84.5 kg)  09/03/17 184 lb 2 oz (83.5 kg)    Physical Exam Vitals signs and nursing note reviewed.  Constitutional:      General: She is not in acute distress.    Appearance: Normal appearance. She is not ill-appearing, toxic-appearing or diaphoretic.  HENT:     Head: Normocephalic and atraumatic.     Right Ear: External ear normal.     Left Ear: External ear normal.     Nose: Nose normal.     Mouth/Throat:     Mouth: Mucous membranes are moist.     Pharynx: Oropharynx is clear.  Eyes:     General: No scleral  icterus.       Right eye: No discharge.        Left eye: No discharge.     Conjunctiva/sclera: Conjunctivae normal.     Pupils: Pupils are equal, round, and reactive to light.  Neck:     Musculoskeletal: Normal range of motion.  Pulmonary:     Effort: Pulmonary effort is normal. No respiratory distress.     Comments: Speaking in full sentences Musculoskeletal: Normal range of motion.  Skin:    Coloration: Skin is not jaundiced or pale.     Findings: No bruising, erythema, lesion or rash.  Neurological:     Mental Status: She is alert and oriented to person, place, and time. Mental status is at baseline.  Psychiatric:        Mood and Affect: Mood normal.        Behavior: Behavior normal.        Thought Content: Thought content normal.        Judgment: Judgment normal.     Results for orders placed or performed in visit on 03/10/18  Microscopic Examination   URINE  Result Value Ref Range   WBC, UA None seen 0 - 5 /hpf   RBC, UA 0-2 0 - 2 /hpf   Epithelial Cells (non renal) 0-10 0 - 10 /hpf   Bacteria, UA None seen None seen/Few  [employer] Enterprises[employer]ayer  DCA Hb A1c Waived  Result Value Ref Range   HB A1C (BAYER DCA - WAIVED) 7.4 (H) <7.0 %  CBC with Differential/Platelet  Result Value Ref Range   WBC 7.4 3.4 - 10.8 x10E3/uL   RBC 5.18 3.77 - 5.28 x10E6/uL   Hemoglobin 15.3 11.1 - 15.9 g/dL   Hematocrit 46.3 34.0 - 46.6 %   MCV 89 79 - 97 fL   MCH 29.5 26.6 - 33.0 pg   MCHC 33.0 31.5 - 35.7 g/dL   RDW 13.2 11.7 - 15.4 %   Platelets 327 150 - 450 x10E3/uL   Neutrophils 58 Not Estab. %   Lymphs 24 Not Estab. %   Monocytes 8 Not Estab. %   Eos 9 Not Estab. %   Basos 1 Not Estab. %   Neutrophils Absolute 4.4 1.4 - 7.0 x10E3/uL   Lymphocytes Absolute 1.8 0.7 - 3.1 x10E3/uL   Monocytes Absolute 0.6 0.1 - 0.9 x10E3/uL   EOS (ABSOLUTE) 0.6 (H) 0.0 - 0.4 x10E3/uL   Basophils Absolute 0.1 0.0 - 0.2 x10E3/uL   Immature Granulocytes 0 Not Estab. %   Immature Grans (Abs) 0.0 0.0 - 0.1 x10E3/uL   Comprehensive metabolic panel  Result Value Ref Range   Glucose 137 (H) 65 - 99 mg/dL   BUN 14 6 - 24 mg/dL   Creatinine, Ser 0.82 0.57 - 1.00 mg/dL   GFR calc non Af Amer 81 >59 mL/min/1.73   GFR calc Af Amer 93 >59 mL/min/1.73   BUN/Creatinine Ratio 17 9 - 23   Sodium 137 134 - 144 mmol/L   Potassium 3.7 3.5 - 5.2 mmol/L   Chloride 96 96 - 106 mmol/L   CO2 22 20 - 29 mmol/L   Calcium 9.6 8.7 - 10.2 mg/dL   Total Protein 7.5 6.0 - 8.5 g/dL   Albumin 4.5 3.8 - 4.9 g/dL   Globulin, Total 3.0 1.5 - 4.5 g/dL   Albumin/Globulin Ratio 1.5 1.2 - 2.2   Bilirubin Total 0.5 0.0 - 1.2 mg/dL   Alkaline Phosphatase 82 39 - 117 IU/L   AST 16 0 - 40 IU/L   ALT 16 0 - 32 IU/L  Lipid Panel w/o Chol/HDL Ratio  Result Value Ref Range   Cholesterol, Total 176 100 - 199 mg/dL   Triglycerides 242 (H) 0 - 149 mg/dL   HDL 36 (L) >39 mg/dL   VLDL Cholesterol Cal 48 (H) 5 - 40 mg/dL   LDL Calculated 92 0 - 99 mg/dL  Microalbumin, Urine Waived  Result Value Ref Range   Microalb, Ur Waived 10 0 - 19 mg/L   Creatinine, Urine Waived 50 10 - 300 mg/dL   Microalb/Creat Ratio 30-300 (H) <30 mg/g  TSH  Result Value Ref Range   TSH 1.260 0.450 - 4.500 uIU/mL  UA/M w/rflx Culture, Routine   Specimen: Urine   URINE  Result Value Ref Range   Specific Gravity, UA 1.010 1.005 - 1.030   pH, UA 6.0 5.0 - 7.5   Color, UA Yellow Yellow   Appearance Ur Clear Clear   Leukocytes, UA Negative Negative   Protein, UA Negative Negative/Trace   Glucose, UA 3+ (A) Negative   Ketones, UA Negative Negative   RBC, UA Trace (A) Negative   Bilirubin, UA Negative Negative   Urobilinogen, Ur 1.0 0.2 - 1.0 mg/dL   Nitrite, UA Negative Negative   Microscopic Examination See below:       Assessment & Plan:   Problem List Items Addressed  This Visit      Endocrine   Type 2 diabetes mellitus (HCC) - Primary    Due for recheck on A1c. Will get her in for labs and vaccines. Call with any concerns. Continue to monitor.        Relevant Orders   Bayer DCA Hb A1c Waived    Other Visit Diagnoses    Ganglion cyst of wrist, left       Starting to bother her at work. Will get her into hand surgeon. Call with any concerns.    Relevant Orders   Ambulatory referral to Hand Surgery       Follow up plan: Return in about 3 months (around 09/18/2018) for 6 month follow up.    . This visit was completed via Doximity due to the restrictions of the COVID-19 pandemic. All issues as above were discussed and addressed. Physical exam was done as above through visual confirmation on Doximity. If it was felt that the patient should be evaluated in the office, they were directed there. The patient verbally consented to this visit. . Location of the patient: work . Location of the provider: work . Those involved with this call:  . Provider: Olevia PerchesMegan Johnson, DO . CMA: Elton SinAnita Quito, CMA . Front Desk/Registration: Adela Portshristan Williamson  . Time spent on call: 25 minutes with patient face to face via video conference. More than 50% of this time was spent in counseling and coordination of care. 40 minutes total spent in review of patient's record and preparation of their chart.

## 2018-06-18 NOTE — Addendum Note (Signed)
Addended by: Valerie Roys on: 06/18/2018 10:01 AM   Modules accepted: Level of Service

## 2018-06-18 NOTE — Assessment & Plan Note (Signed)
Due for recheck on A1c. Will get her in for labs and vaccines. Call with any concerns. Continue to monitor.

## 2018-07-09 ENCOUNTER — Ambulatory Visit (INDEPENDENT_AMBULATORY_CARE_PROVIDER_SITE_OTHER): Payer: 59

## 2018-07-09 ENCOUNTER — Other Ambulatory Visit: Payer: Self-pay

## 2018-07-09 ENCOUNTER — Other Ambulatory Visit: Payer: 59

## 2018-07-09 DIAGNOSIS — Z23 Encounter for immunization: Secondary | ICD-10-CM | POA: Diagnosis not present

## 2018-07-09 DIAGNOSIS — E1149 Type 2 diabetes mellitus with other diabetic neurological complication: Secondary | ICD-10-CM

## 2018-07-09 LAB — BAYER DCA HB A1C WAIVED: HB A1C (BAYER DCA - WAIVED): 8 % — ABNORMAL HIGH (ref <7.0)

## 2018-07-17 ENCOUNTER — Ambulatory Visit: Payer: Self-pay | Admitting: Family Medicine

## 2018-07-28 ENCOUNTER — Ambulatory Visit: Payer: Self-pay | Admitting: Family Medicine

## 2018-08-08 ENCOUNTER — Other Ambulatory Visit: Payer: Self-pay | Admitting: Family Medicine

## 2018-09-12 ENCOUNTER — Other Ambulatory Visit: Payer: Self-pay | Admitting: Family Medicine

## 2018-09-12 NOTE — Telephone Encounter (Signed)
Requested Prescriptions  Pending Prescriptions Disp Refills  . DULoxetine (CYMBALTA) 20 MG capsule [Pharmacy Med Name: DULOXETINE DR 20MG  CAPSULES] 90 capsule 0    Sig: TAKE 1 CAPSULE(20 MG) BY MOUTH DAILY     Psychiatry: Antidepressants - SNRI Failed - 09/12/2018  9:08 AM      Failed - Completed PHQ-2 or PHQ-9 in the last 360 days.      Passed - Last BP in normal range    BP Readings from Last 1 Encounters:  03/10/18 117/79         Passed - Valid encounter within last 6 months    Recent Outpatient Visits          2 months ago Type 2 diabetes mellitus with other neurologic complication, without long-term current use of insulin ([place])   [place], Megan P, DO   6 months ago Routine general medical examination at a health care facility   [facility], [place] P, DO   9 months ago Type 2 diabetes mellitus with other neurologic complication, without long-term current use of insulin ([place])   [place], Megan P, DO   1 year ago Type 2 diabetes mellitus with other neurologic complication, without long-term current use of insulin ([place])   [place], [place], DO   1 year ago Essential hypertension   [place], [place], DO      Future Appointments            In 1 week Wynetta Emery, Barb Merino, DO [facility]           PHQ-9 is due; has upcoming appt. 9/14; refilled # [phone]

## 2018-09-12 NOTE — Telephone Encounter (Signed)
Forwarding medication refill request to PCP for review. 

## 2018-09-21 ENCOUNTER — Encounter: Payer: Self-pay | Admitting: Family Medicine

## 2018-09-21 ENCOUNTER — Other Ambulatory Visit: Payer: Self-pay

## 2018-09-21 ENCOUNTER — Ambulatory Visit: Payer: 59 | Admitting: Family Medicine

## 2018-09-21 VITALS — BP 121/85 | HR 73 | Temp 98.2°F | Ht 63.0 in | Wt 185.0 lb

## 2018-09-21 DIAGNOSIS — I10 Essential (primary) hypertension: Secondary | ICD-10-CM | POA: Diagnosis not present

## 2018-09-21 DIAGNOSIS — E1149 Type 2 diabetes mellitus with other diabetic neurological complication: Secondary | ICD-10-CM | POA: Diagnosis not present

## 2018-09-21 DIAGNOSIS — F4329 Adjustment disorder with other symptoms: Secondary | ICD-10-CM | POA: Diagnosis not present

## 2018-09-21 DIAGNOSIS — E1169 Type 2 diabetes mellitus with other specified complication: Secondary | ICD-10-CM | POA: Diagnosis not present

## 2018-09-21 DIAGNOSIS — F4321 Adjustment disorder with depressed mood: Secondary | ICD-10-CM

## 2018-09-21 DIAGNOSIS — Z634 Disappearance and death of family member: Secondary | ICD-10-CM

## 2018-09-21 DIAGNOSIS — E785 Hyperlipidemia, unspecified: Secondary | ICD-10-CM

## 2018-09-21 LAB — BAYER DCA HB A1C WAIVED: HB A1C (BAYER DCA - WAIVED): 7.5 % — ABNORMAL HIGH (ref <7.0)

## 2018-09-21 MED ORDER — METFORMIN HCL 500 MG PO TABS: ORAL_TABLET | ORAL | 1 refills | Status: DC

## 2018-09-21 MED ORDER — GLIMEPIRIDE 1 MG PO TABS: ORAL_TABLET | ORAL | 1 refills | Status: DC

## 2018-09-21 MED ORDER — PRAVASTATIN SODIUM 20 MG PO TABS: ORAL_TABLET | ORAL | 1 refills | Status: DC

## 2018-09-21 MED ORDER — JARDIANCE 25 MG PO TABS: 25.0000 mg | ORAL_TABLET | Freq: Every day | ORAL | 1 refills | Status: DC

## 2018-09-21 MED ORDER — LOSARTAN POTASSIUM 50 MG PO TABS: ORAL_TABLET | ORAL | 1 refills | Status: DC

## 2018-09-21 MED ORDER — DULOXETINE HCL 20 MG PO CPEP: 20.0000 mg | ORAL_CAPSULE | Freq: Every day | ORAL | 1 refills | Status: DC

## 2018-09-21 MED ORDER — HYDROCHLOROTHIAZIDE 25 MG PO TABS: 25.0000 mg | ORAL_TABLET | Freq: Every day | ORAL | 1 refills | Status: DC

## 2018-09-21 NOTE — Assessment & Plan Note (Signed)
Under good control on current regimen. Continue current regimen. Continue to monitor. Call with any concerns. Refills given. Labs drawn today.   

## 2018-09-21 NOTE — Progress Notes (Signed)
BP 121/85   Pulse 73   Temp 98.2 F (36.8 C) (Oral)   Ht 5\' 3"  (1.6 m)   Wt 185 lb (83.9 kg)   SpO2 97%   BMI 32.77 kg/m    Subjective:    Patient ID: Lori Horne, female    DOB: 1965/11/16, 53 y.o.   MRN: [id number]  HPI: Lori Horne is a 53 y.o. female  Chief Complaint  Patient presents with  . Diabetes  . Hypertension  . Hyperlipidemia   HYPERTENSION / HYPERLIPIDEMIA Satisfied with current treatment? yes Duration of hypertension: chronic BP monitoring frequency: not checking BP medication side effects: no Past BP meds: losartan, HCTZ Duration of hyperlipidemia: chronic Cholesterol medication side effects: no Cholesterol supplements: none Past cholesterol medications: pravastatin Medication compliance: excellent compliance Aspirin: yes Recent stressors: no Recurrent headaches: no Visual changes: no Palpitations: no Dyspnea: no Chest pain: no Lower extremity edema: no Dizzy/lightheaded: no  DIABETES Hypoglycemic episodes:no Polydipsia/polyuria: no Visual disturbance: no Chest pain: no Paresthesias: no Glucose Monitoring: yes  Accucheck frequency: Daily Taking Insulin?: no Blood Pressure Monitoring: not checking Retinal Examination: Not Up to Date Foot Exam: Up to Date Diabetic Education: Completed Pneumovax: Up to Date Influenza: Refused Aspirin: yes  GRIEF Mood status: controlled Satisfied with current treatment?: yes Symptom severity: mild  Duration of current treatment : chronic Side effects: no Medication compliance: excellent compliance Psychotherapy/counseling: no  Previous psychiatric medications: cymbalta Depressed mood: no Anxious mood: no Anhedonia: no Significant weight loss or gain: no Insomnia: no  Fatigue: no Feelings of worthlessness or guilt: no Impaired concentration/indecisiveness: no Suicidal ideations: no Hopelessness: no Crying spells: no Depression screen [facility] 2/9 09/21/2018 03/10/2018 09/03/2017 11/19/2016  08/15/2016  Decreased Interest 0 0 0 0 0  Down, Depressed, Hopeless 0 0 0 0 0  PHQ - 2 Score 0 0 0 0 0  Altered sleeping 0 0 1 - -  Tired, decreased energy 0 1 0 - -  Change in appetite 0 0 0 - -  Feeling bad or failure about yourself  0 0 0 - -  Trouble concentrating 0 0 1 - -  Moving slowly or fidgety/restless 0 0 0 - -  Suicidal thoughts 0 0 0 - -  PHQ-9 Score 0 1 2 - -  Difficult doing work/chores Not difficult at all Not difficult at all Not difficult at all - -    Relevant past medical, surgical, family and social history reviewed and updated as indicated. Interim medical history since our last visit reviewed. Allergies and medications reviewed and updated.  Review of Systems  Constitutional: Negative.   Respiratory: Negative.   Cardiovascular: Negative.   Gastrointestinal: Negative.   Musculoskeletal: Negative.   Neurological: Negative.   Psychiatric/Behavioral: Negative.     Per HPI unless specifically indicated above     Objective:    BP 121/85   Pulse 73   Temp 98.2 F (36.8 C) (Oral)   Ht 5\' 3"  (1.6 m)   Wt 185 lb (83.9 kg)   SpO2 97%   BMI 32.77 kg/m   Wt Readings from Last 3 Encounters:  09/21/18 185 lb (83.9 kg)  03/10/18 185 lb (83.9 kg)  12/08/17 186 lb 3.2 oz (84.5 kg)    Physical Exam Vitals signs and nursing note reviewed.  Constitutional:      General: She is not in acute distress.    Appearance: Normal appearance. She is not ill-appearing, toxic-appearing or diaphoretic.  HENT:     Head: Normocephalic and atraumatic.  Right Ear: External ear normal.     Left Ear: External ear normal.     Nose: Nose normal.     Mouth/Throat:     Mouth: Mucous membranes are moist.     Pharynx: Oropharynx is clear.  Eyes:     General: No scleral icterus.       Right eye: No discharge.        Left eye: No discharge.     Extraocular Movements: Extraocular movements intact.     Conjunctiva/sclera: Conjunctivae normal.     Pupils: Pupils are equal,  round, and reactive to light.  Neck:     Musculoskeletal: Normal range of motion and neck supple.  Cardiovascular:     Rate and Rhythm: Normal rate and regular rhythm.     Pulses: Normal pulses.     Heart sounds: Normal heart sounds. No murmur. No friction rub. No gallop.   Pulmonary:     Effort: Pulmonary effort is normal. No respiratory distress.     Breath sounds: Normal breath sounds. No stridor. No wheezing, rhonchi or rales.  Chest:     Chest wall: No tenderness.  Musculoskeletal: Normal range of motion.  Skin:    General: Skin is warm and dry.     Capillary Refill: Capillary refill takes less than 2 seconds.     Coloration: Skin is not jaundiced or pale.     Findings: No bruising, erythema, lesion or rash.  Neurological:     General: No focal deficit present.     Mental Status: She is alert and oriented to person, place, and time. Mental status is at baseline.  Psychiatric:        Mood and Affect: Mood normal.        Behavior: Behavior normal.        Thought Content: Thought content normal.        Judgment: Judgment normal.     Results for orders placed or performed in visit on 07/09/18  Bayer DCA Hb A1c Waived  Result Value Ref Range   HB A1C (BAYER DCA - WAIVED) 8.0 (H) <7.0 %      Assessment & Plan:   Problem List Items Addressed This Visit      Cardiovascular and Mediastinum   Hypertension - Primary    Under good control on current regimen. Continue current regimen. Continue to monitor. Call with any concerns. Refills given. Labs drawn today.        Relevant Medications   hydrochlorothiazide (HYDRODIURIL) 25 MG tablet   losartan (COZAAR) 50 MG tablet   pravastatin (PRAVACHOL) 20 MG tablet   Other Relevant Orders   Comprehensive metabolic panel     Endocrine   Type 2 diabetes mellitus ([place])    Improved with A1c of 7.5 down from 8.0- continue current regimen. Continue to monitor. Call with any concerns. Recheck 3 months.       Relevant Medications    glimepiride (AMARYL) 1 MG tablet   empagliflozin (JARDIANCE) 25 MG TABS tablet   metFORMIN (GLUCOPHAGE) 500 MG tablet   losartan (COZAAR) 50 MG tablet   pravastatin (PRAVACHOL) 20 MG tablet   Other Relevant Orders   Bayer DCA Hb A1c Waived   Comprehensive metabolic panel   Hyperlipidemia associated with type 2 diabetes mellitus ([place])    Under good control on current regimen. Continue current regimen. Continue to monitor. Call with any concerns. Refills given. Labs drawn today.        Relevant Medications   glimepiride (  AMARYL) 1 MG tablet   empagliflozin (JARDIANCE) 25 MG TABS tablet   metFORMIN (GLUCOPHAGE) 500 MG tablet   losartan (COZAAR) 50 MG tablet   pravastatin (PRAVACHOL) 20 MG tablet   Other Relevant Orders   Comprehensive metabolic panel   Lipid Panel w/o Chol/HDL Ratio     Other   Complicated grief    Under good control on current regimen. Continue current regimen. Continue to monitor. Call with any concerns. Refills given. Labs drawn today.        Relevant Orders   Comprehensive metabolic panel       Follow up plan: Return in about 3 months (around 12/21/2018) for Dm follow up.

## 2018-09-21 NOTE — Assessment & Plan Note (Signed)
Improved with A1c of 7.5 down from 8.0- continue current regimen. Continue to monitor. Call with any concerns. Recheck 3 months.

## 2018-09-22 LAB — COMPREHENSIVE METABOLIC PANEL
ALT: 18 IU/L (ref 0–32)
AST: 15 IU/L (ref 0–40)
Albumin/Globulin Ratio: 1.4 (ref 1.2–2.2)
Albumin: 4.4 g/dL (ref 3.8–4.9)
Alkaline Phosphatase: 88 IU/L (ref 39–117)
BUN/Creatinine Ratio: 20 (ref 9–23)
BUN: 16 mg/dL (ref 6–24)
Bilirubin Total: 0.2 mg/dL (ref 0.0–1.2)
CO2: 22 mmol/L (ref 20–29)
Calcium: 9.5 mg/dL (ref 8.7–10.2)
Chloride: 97 mmol/L (ref 96–106)
Creatinine, Ser: 0.79 mg/dL (ref 0.57–1.00)
GFR calc Af Amer: 97 mL/min/{1.73_m2} (ref >59)
GFR calc non Af Amer: 85 mL/min/{1.73_m2} (ref >59)
Globulin, Total: 3.2 g/dL (ref 1.5–4.5)
Glucose: 156 mg/dL — ABNORMAL HIGH (ref 65–99)
Potassium: 4.5 mmol/L (ref 3.5–5.2)
Sodium: 136 mmol/L (ref 134–144)
Total Protein: 7.6 g/dL (ref 6.0–8.5)

## 2018-09-22 LAB — LIPID PANEL W/O CHOL/HDL RATIO
Cholesterol, Total: 167 mg/dL (ref 100–199)
HDL: 35 mg/dL — ABNORMAL LOW (ref >39)
LDL Chol Calc (NIH): 83 mg/dL (ref 0–99)
Triglycerides: 297 mg/dL — ABNORMAL HIGH (ref 0–149)
VLDL Cholesterol Cal: 49 mg/dL — ABNORMAL HIGH (ref 5–40)

## 2018-10-07 LAB — HM DIABETES EYE EXAM

## 2018-12-22 ENCOUNTER — Encounter: Payer: Self-pay | Admitting: Family Medicine

## 2018-12-22 ENCOUNTER — Other Ambulatory Visit: Payer: Self-pay

## 2018-12-22 ENCOUNTER — Ambulatory Visit (INDEPENDENT_AMBULATORY_CARE_PROVIDER_SITE_OTHER): Payer: 59 | Admitting: Family Medicine

## 2018-12-22 VITALS — BP 130/82

## 2018-12-22 DIAGNOSIS — E1149 Type 2 diabetes mellitus with other diabetic neurological complication: Secondary | ICD-10-CM | POA: Diagnosis not present

## 2018-12-22 NOTE — Assessment & Plan Note (Signed)
Feeling well- will get her in for A1c ASAP. Await results. Call with any concerns.

## 2018-12-22 NOTE — Progress Notes (Signed)
BP 130/82    Subjective:    Patient ID: Lori Horne, female    DOB: 07-Dec-1965, 53 y.o.   MRN: [id number]  HPI: Lori Horne is a 53 y.o. female  Chief Complaint  Patient presents with  . Diabetes   DIABETES- Has not been eating what she's supposed to be eating Hypoglycemic episodes:no Polydipsia/polyuria: no Visual disturbance: no Chest pain: no Paresthesias: no Glucose Monitoring: yes  Accucheck frequency: at least daily and more often  Fasting glucose: 100-208 Taking Insulin?: no Blood Pressure Monitoring: a few times a month Retinal Examination: Up to Date Foot Exam: Not up to Date Diabetic Education: Completed Pneumovax: Up to Date Influenza: Refused Aspirin: yes  Relevant past medical, surgical, family and social history reviewed and updated as indicated. Interim medical history since our last visit reviewed. Allergies and medications reviewed and updated.  Review of Systems  Constitutional: Negative.   Respiratory: Negative.   Cardiovascular: Negative.   Musculoskeletal: Negative.   Psychiatric/Behavioral: Negative.     Per HPI unless specifically indicated above     Objective:    BP 130/82   Wt Readings from Last 3 Encounters:  09/21/18 185 lb (83.9 kg)  03/10/18 185 lb (83.9 kg)  12/08/17 186 lb 3.2 oz (84.5 kg)    Physical Exam Vitals and nursing note reviewed.  Constitutional:      General: She is not in acute distress.    Appearance: Normal appearance. She is not ill-appearing, toxic-appearing or diaphoretic.  HENT:     Head: Normocephalic and atraumatic.     Right Ear: External ear normal.     Left Ear: External ear normal.     Nose: Nose normal.     Mouth/Throat:     Mouth: Mucous membranes are moist.     Pharynx: Oropharynx is clear.  Eyes:     General: No scleral icterus.       Right eye: No discharge.        Left eye: No discharge.     Conjunctiva/sclera: Conjunctivae normal.     Pupils: Pupils are equal, round, and reactive  to light.  Pulmonary:     Effort: Pulmonary effort is normal. No respiratory distress.     Comments: Speaking in full sentences Musculoskeletal:        General: Normal range of motion.     Cervical back: Normal range of motion.  Skin:    Coloration: Skin is not jaundiced or pale.     Findings: No bruising, erythema, lesion or rash.  Neurological:     Mental Status: She is alert and oriented to person, place, and time. Mental status is at baseline.  Psychiatric:        Mood and Affect: Mood normal.        Behavior: Behavior normal.        Thought Content: Thought content normal.        Judgment: Judgment normal.     Results for orders placed or performed in visit on 09/21/18  Bayer DCA Hb A1c Waived  Result Value Ref Range   HB A1C (BAYER DCA - WAIVED) 7.5 (H) <7.0 %  Comprehensive metabolic panel  Result Value Ref Range   Glucose 156 (H) 65 - 99 mg/dL   BUN 16 6 - 24 mg/dL   Creatinine, Ser 3.61 0.57 - 1.00 mg/dL   GFR calc non Af Amer 85 >59 mL/min/1.73   GFR calc Af Amer 97 >59 mL/min/1.73   BUN/Creatinine Ratio 20 9 -  23   Sodium 136 134 - 144 mmol/L   Potassium 4.5 3.5 - 5.2 mmol/L   Chloride 97 96 - 106 mmol/L   CO2 22 20 - 29 mmol/L   Calcium 9.5 8.7 - 10.2 mg/dL   Total Protein 7.6 6.0 - 8.5 g/dL   Albumin 4.4 3.8 - 4.9 g/dL   Globulin, Total 3.2 1.5 - 4.5 g/dL   Albumin/Globulin Ratio 1.4 1.2 - 2.2   Bilirubin Total 0.2 0.0 - 1.2 mg/dL   Alkaline Phosphatase 88 39 - 117 IU/L   AST 15 0 - 40 IU/L   ALT 18 0 - 32 IU/L  Lipid Panel w/o Chol/HDL Ratio  Result Value Ref Range   Cholesterol, Total 167 100 - 199 mg/dL   Triglycerides 297 (H) 0 - 149 mg/dL   HDL 35 (L) >39 mg/dL   VLDL Cholesterol Cal 49 (H) 5 - 40 mg/dL   LDL Chol Calc (NIH) 83 0 - 99 mg/dL      Assessment & Plan:   Problem List Items Addressed This Visit      Endocrine   Type 2 diabetes mellitus ([place]) - Primary    Feeling well- will get her in for A1c ASAP. Await results. Call with any  concerns.      Relevant Orders   Bayer DCA Hb A1c Waived       Follow up plan: Return in about 3 months (around 03/22/2019).    . This visit was completed via Doximity due to the restrictions of the COVID-19 pandemic. All issues as above were discussed and addressed. Physical exam was done as above through visual confirmation on Doximity. If it was felt that the patient should be evaluated in the office, they were directed there. The patient verbally consented to this visit. . Location of the patient: work . Location of the provider: work . Those involved with this call:  . Provider: Park Liter, DO . CMA: Tiffany Reel, CMA . Front Desk/Registration: Don Perking  . Time spent on call: 15 minutes with patient face to face via video conference. More than 50% of this time was spent in counseling and coordination of care. 23 minutes total spent in review of patient's record and preparation of their chart.

## 2018-12-24 ENCOUNTER — Other Ambulatory Visit: Payer: 59

## 2018-12-25 ENCOUNTER — Other Ambulatory Visit: Payer: 59

## 2018-12-25 ENCOUNTER — Other Ambulatory Visit: Payer: Self-pay

## 2018-12-25 DIAGNOSIS — E1149 Type 2 diabetes mellitus with other diabetic neurological complication: Secondary | ICD-10-CM

## 2018-12-25 LAB — BAYER DCA HB A1C WAIVED: HB A1C (BAYER DCA - WAIVED): 7.4 % — ABNORMAL HIGH (ref <7.0)

## 2018-12-28 ENCOUNTER — Encounter: Payer: Self-pay | Admitting: Family Medicine

## 2018-12-31 ENCOUNTER — Telehealth: Payer: Self-pay | Admitting: Family Medicine

## 2018-12-31 NOTE — Telephone Encounter (Signed)
Patient is calling to request prescription for contour test strips. Pharmacy advised the patient that the test strips would be cheaper. The insurance may pay for the test strips. Please advise. [phone] [facility] [place], [place]

## 2019-01-04 NOTE — Telephone Encounter (Signed)
Placed in folder for signature

## 2019-01-04 NOTE — Telephone Encounter (Signed)
OK to generate order form, will sign

## 2019-01-05 NOTE — Telephone Encounter (Signed)
Signed and placed in "done" pile yesterday evening

## 2019-01-06 ENCOUNTER — Encounter: Payer: Self-pay | Admitting: Family Medicine

## 2019-03-28 ENCOUNTER — Other Ambulatory Visit: Payer: Self-pay | Admitting: Family Medicine

## 2019-03-28 NOTE — Telephone Encounter (Signed)
Requested Prescriptions  Pending Prescriptions Disp Refills  . metFORMIN (GLUCOPHAGE) 500 MG tablet [Pharmacy Med Name: METFORMIN '500MG'$  TABLETS] 360 tablet 0    Sig: TAKE 2 TABLETS(1000 MG) BY MOUTH TWICE DAILY WITH A MEAL     Endocrinology:  Diabetes - Biguanides Passed - 03/28/2019  3:24 AM      Passed - Cr in normal range and within 360 days    Creatinine, Ser  Date Value Ref Range Status  09/21/2018 0.79 0.57 - 1.00 mg/dL Final         Passed - HBA1C is between 0 and 7.9 and within 180 days    Hemoglobin A1C  Date Value Ref Range Status  11/15/2015 7.4  Final   HB A1C (BAYER DCA - WAIVED)  Date Value Ref Range Status  12/25/2018 7.4 (H) <7.0 % Final    Comment:                                          Diabetic Adult            <7.0                                       Healthy Adult        4.3 - 5.7                                                           (DCCT/NGSP) American Diabetes Association's Summary of Glycemic Recommendations for Adults with Diabetes: Hemoglobin A1c <7.0%. More stringent glycemic goals (A1c <6.0%) may further reduce complications at the cost of increased risk of hypoglycemia.          Passed - eGFR in normal range and within 360 days    GFR calc Af Amer  Date Value Ref Range Status  09/21/2018 97 >59 mL/min/1.73 Final   GFR calc non Af Amer  Date Value Ref Range Status  09/21/2018 85 >59 mL/min/1.73 Final         Passed - Valid encounter within last 6 months    Recent Outpatient Visits          3 months ago Type 2 diabetes mellitus with other neurologic complication, without long-term current use of insulin ([place])   [facility], Megan P, DO   6 months ago Essential hypertension   [place], Megan P, DO   9 months ago Type 2 diabetes mellitus with other neurologic complication, 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

## 2019-03-29 ENCOUNTER — Other Ambulatory Visit: Payer: Self-pay

## 2019-03-29 NOTE — Telephone Encounter (Signed)
Can we see if she has enough to make it to 4/1

## 2019-03-29 NOTE — Telephone Encounter (Signed)
Patient will let us know if she has enough medication tomorrow.

## 2019-03-29 NOTE — Telephone Encounter (Signed)
Called and spoke with patient, she will check and let us know tomorrow.

## 2019-03-29 NOTE — Telephone Encounter (Signed)
Refill request for HCTZ, Pravastatin, Duloxetine, Losartan, Glimperide LOV: 12/22/18 Next Appt: 04/15/19

## 2019-03-30 ENCOUNTER — Other Ambulatory Visit: Payer: Self-pay | Admitting: Family Medicine

## 2019-03-30 NOTE — Telephone Encounter (Signed)
LOV: 12/22/2018, NOV: 04/15/2019

## 2019-03-30 NOTE — Telephone Encounter (Signed)
Requested medications are due for refill today?  An alert comes up with all these medications except Jardiance indicating there may be duplicates because of an encounter Keri entered yesterday.   Requested medications are on active medication list?  Yes  Last Refill:   All meds are due for refills.   Future visit scheduled?  Yes  Notes to Clinic:     Did not know whether to fill or not due to the alert for each medication, as there is a note from CNA indicating patient is supposed to call her back today.

## 2019-04-06 ENCOUNTER — Encounter: Payer: Self-pay | Admitting: Family Medicine

## 2019-04-06 ENCOUNTER — Other Ambulatory Visit: Payer: Self-pay | Admitting: Family Medicine

## 2019-04-06 LAB — HM MAMMOGRAPHY

## 2019-04-06 MED ORDER — LORATADINE 10 MG PO TABS: 10.0000 mg | ORAL_TABLET | Freq: Every day | ORAL | 11 refills | Status: DC

## 2019-04-15 ENCOUNTER — Encounter: Payer: Self-pay | Admitting: Family Medicine

## 2019-04-15 ENCOUNTER — Ambulatory Visit (INDEPENDENT_AMBULATORY_CARE_PROVIDER_SITE_OTHER): Payer: 59 | Admitting: Family Medicine

## 2019-04-15 ENCOUNTER — Other Ambulatory Visit: Payer: Self-pay

## 2019-04-15 VITALS — BP 115/76 | HR 74 | Temp 97.8°F | Ht 64.37 in | Wt 184.8 lb

## 2019-04-15 DIAGNOSIS — E1169 Type 2 diabetes mellitus with other specified complication: Secondary | ICD-10-CM

## 2019-04-15 DIAGNOSIS — E785 Hyperlipidemia, unspecified: Secondary | ICD-10-CM

## 2019-04-15 DIAGNOSIS — I10 Essential (primary) hypertension: Secondary | ICD-10-CM | POA: Diagnosis not present

## 2019-04-15 DIAGNOSIS — Z0000 Encounter for general adult medical examination without abnormal findings: Secondary | ICD-10-CM | POA: Diagnosis not present

## 2019-04-15 DIAGNOSIS — E1141 Type 2 diabetes mellitus with diabetic mononeuropathy: Secondary | ICD-10-CM | POA: Diagnosis not present

## 2019-04-15 DIAGNOSIS — E1149 Type 2 diabetes mellitus with other diabetic neurological complication: Secondary | ICD-10-CM | POA: Diagnosis not present

## 2019-04-15 LAB — UA/M W/RFLX CULTURE, ROUTINE
Bilirubin, UA: NEGATIVE
Ketones, UA: NEGATIVE
Leukocytes,UA: NEGATIVE
Nitrite, UA: NEGATIVE
Protein,UA: NEGATIVE
Specific Gravity, UA: 1.015 (ref 1.005–1.030)
Urobilinogen, Ur: 1 mg/dL (ref 0.2–1.0)
pH, UA: 5.5 (ref 5.0–7.5)

## 2019-04-15 LAB — MICROSCOPIC EXAMINATION: Bacteria, UA: NONE SEEN

## 2019-04-15 LAB — MICROALBUMIN, URINE WAIVED
Creatinine, Urine Waived: 50 mg/dL (ref 10–300)
Microalb, Ur Waived: 30 mg/L — ABNORMAL HIGH (ref 0–19)

## 2019-04-15 LAB — BAYER DCA HB A1C WAIVED: HB A1C (BAYER DCA - WAIVED): 7.4 % — ABNORMAL HIGH (ref <7.0)

## 2019-04-15 MED ORDER — GLIMEPIRIDE 1 MG PO TABS: ORAL_TABLET | ORAL | 1 refills | Status: DC

## 2019-04-15 MED ORDER — JARDIANCE 25 MG PO TABS: 25.0000 mg | ORAL_TABLET | Freq: Every day | ORAL | 1 refills | Status: DC

## 2019-04-15 MED ORDER — PRAVASTATIN SODIUM 20 MG PO TABS: ORAL_TABLET | ORAL | 1 refills | Status: DC

## 2019-04-15 MED ORDER — METFORMIN HCL 500 MG PO TABS: 1000.0000 mg | ORAL_TABLET | Freq: Two times a day (BID) | ORAL | 1 refills | Status: DC

## 2019-04-15 MED ORDER — DULOXETINE HCL 20 MG PO CPEP: 20.0000 mg | ORAL_CAPSULE | Freq: Every day | ORAL | 1 refills | Status: DC

## 2019-04-15 MED ORDER — HYDROCHLOROTHIAZIDE 25 MG PO TABS: 25.0000 mg | ORAL_TABLET | Freq: Every day | ORAL | 1 refills | Status: DC

## 2019-04-15 MED ORDER — LOSARTAN POTASSIUM 50 MG PO TABS: ORAL_TABLET | ORAL | 1 refills | Status: DC

## 2019-04-15 MED ORDER — ASPIRIN EC 81 MG PO TBEC: 81.0000 mg | DELAYED_RELEASE_TABLET | Freq: Every day | ORAL | 3 refills | Status: DC

## 2019-04-15 NOTE — Patient Instructions (Signed)
Health Maintenance, Female Adopting a healthy lifestyle and getting preventive care are important in promoting health and wellness. Ask your health care provider about:  The right schedule for you to have regular tests and exams.  Things you can do on your own to prevent diseases and keep yourself healthy. What should I know about diet, weight, and exercise? Eat a healthy diet   Eat a diet that includes plenty of vegetables, fruits, low-fat dairy products, and lean protein.  Do not eat a lot of foods that are high in solid fats, added sugars, or sodium. Maintain a healthy weight Body mass index (BMI) is used to identify weight problems. It estimates body fat based on height and weight. Your health care provider can help determine your BMI and help you achieve or maintain a healthy weight. Get regular exercise Get regular exercise. This is one of the most important things you can do for your health. Most adults should:  Exercise for at least 150 minutes each week. The exercise should increase your heart rate and make you sweat (moderate-intensity exercise).  Do strengthening exercises at least twice a week. This is in addition to the moderate-intensity exercise.  Spend less time sitting. Even light physical activity can be beneficial. Watch cholesterol and blood lipids Have your blood tested for lipids and cholesterol at 56 years of age, then have this test every 5 years. Have your cholesterol levels checked more often if:  Your lipid or cholesterol levels are high.  You are older than 56 years of age.  You are at high risk for heart disease. What should I know about cancer screening? Depending on your health history and family history, you may need to have cancer screening at various ages. This may include screening for:  Breast cancer.  Cervical cancer.  Colorectal cancer.  Skin cancer.  Lung cancer. What should I know about heart disease, diabetes, and high blood  pressure? Blood pressure and heart disease  High blood pressure causes heart disease and increases the risk of stroke. This is more likely to develop in people who have high blood pressure readings, are of African descent, or are overweight.  Have your blood pressure checked: ? Every 3-5 years if you are 18-39 years of age. ? Every year if you are 40 years old or older. Diabetes Have regular diabetes screenings. This checks your fasting blood sugar level. Have the screening done:  Once every three years after age 40 if you are at a normal weight and have a low risk for diabetes.  More often and at a younger age if you are overweight or have a high risk for diabetes. What should I know about preventing infection? Hepatitis B If you have a higher risk for hepatitis B, you should be screened for this virus. Talk with your health care provider to find out if you are at risk for hepatitis B infection. Hepatitis C Testing is recommended for:  Everyone born from 1945 through 1965.  Anyone with known risk factors for hepatitis C. Sexually transmitted infections (STIs)  Get screened for STIs, including gonorrhea and chlamydia, if: ? You are sexually active and are younger than 56 years of age. ? You are older than 56 years of age and your health care provider tells you that you are at risk for this type of infection. ? Your sexual activity has changed since you were last screened, and you are at increased risk for chlamydia or gonorrhea. Ask your health care provider if   you are at risk.  Ask your health care provider about whether you are at high risk for HIV. Your health care provider may recommend a prescription medicine to help prevent HIV infection. If you choose to take medicine to prevent HIV, you should first get tested for HIV. You should then be tested every 3 months for as long as you are taking the medicine. Pregnancy  If you are about to stop having your period (premenopausal) and  you may become pregnant, seek counseling before you get pregnant.  Take 400 to 800 micrograms (mcg) of folic acid every day if you become pregnant.  Ask for birth control (contraception) if you want to prevent pregnancy. Osteoporosis and menopause Osteoporosis is a disease in which the bones lose minerals and strength with aging. This can result in bone fractures. If you are 65 years old or older, or if you are at risk for osteoporosis and fractures, ask your health care provider if you should:  Be screened for bone loss.  Take a calcium or vitamin D supplement to lower your risk of fractures.  Be given hormone replacement therapy (HRT) to treat symptoms of menopause. Follow these instructions at home: Lifestyle  Do not use any products that contain nicotine or tobacco, such as cigarettes, e-cigarettes, and chewing tobacco. If you need help quitting, ask your health care provider.  Do not use street drugs.  Do not share needles.  Ask your health care provider for help if you need support or information about quitting drugs. Alcohol use  Do not drink alcohol if: ? Your health care provider tells you not to drink. ? You are pregnant, may be pregnant, or are planning to become pregnant.  If you drink alcohol: ? Limit how much you use to 0-1 drink a day. ? Limit intake if you are breastfeeding.  Be aware of how much alcohol is in your drink. In the [place] one drink equals one 12 oz bottle of beer (355 mL), one 5 oz glass of wine (148 mL), or one 1 oz glass of hard liquor (44 mL). General instructions  Schedule regular health, dental, and eye exams.  Stay current with your vaccines.  Tell your health care provider if: ? You often feel depressed. ? You have ever been abused or do not feel safe at home. Summary  Adopting a healthy lifestyle and getting preventive care are important in promoting health and wellness.  Follow your health care provider's instructions about healthy  diet, exercising, and getting tested or screened for diseases.  Follow your health care provider's instructions on monitoring your cholesterol and blood pressure. This information is not intended to replace advice given to you by your health care provider. Make sure you discuss any questions you have with your health care provider. Document Revised: 12/17/2017 Document Reviewed: 12/17/2017 Elsevier Patient Education  2020 [employer] Inc.  

## 2019-04-15 NOTE — Assessment & Plan Note (Signed)
Under good control on current regimen. Continue current regimen. Continue to monitor. Call with any concerns. Refills given. Labs drawn today.   

## 2019-04-15 NOTE — Assessment & Plan Note (Signed)
Under good control on current regimen with a1c of 7.4. Continue current regimen. Continue to monitor. Call with any concerns. Refills given. Labs drawn today. Consider trulicity if A1c still >7 in 3 months.

## 2019-04-15 NOTE — Progress Notes (Signed)
BP 115/76 (BP Location: Left Arm, Patient Position: Sitting, Cuff Size: Normal)   Pulse 74   Temp 97.8 F (36.6 C) (Oral)   Ht 5' 4.37" (1.635 m)   Wt 184 lb 12.8 oz (83.8 kg)   SpO2 97%   BMI 31.36 kg/m    Subjective:    Patient ID: Lori Horne, female    DOB: 1965-11-02, 54 y.o.   MRN: [id number]  HPI: Lori Horne is a 54 y.o. female presenting on 04/15/2019 for comprehensive medical examination. Current medical complaints include:  DIABETES Hypoglycemic episodes:no Polydipsia/polyuria: no Visual disturbance: no Chest pain: no Paresthesias: no Glucose Monitoring: yes  Accucheck frequency: occasionally  Fasting glucose: 95 Taking Insulin?: no Blood Pressure Monitoring: not checking Retinal Examination: Not up to Date Foot Exam: Done today Diabetic Education: Completed Pneumovax: Up to Date Influenza: Up to Date Aspirin: yes  HYPERTENSION / HYPERLIPIDEMIA Satisfied with current treatment? yes Duration of hypertension: chronic BP monitoring frequency: not checking BP medication side effects: no Past BP meds: HCTZ, losartan Duration of hyperlipidemia: chronic Cholesterol medication side effects: no Cholesterol supplements: none Past cholesterol medications: pravastatin Medication compliance: excellent compliance Aspirin: yes Recent stressors: no Recurrent headaches: no Visual changes: no Palpitations: no Dyspnea: no Chest pain: no Lower extremity edema: no Dizzy/lightheaded: no  Menopausal Symptoms: no  Depression Screen done today and results listed below:  Depression screen [facility] 2/9 04/15/2019 12/22/2018 09/21/2018 03/10/2018 09/03/2017  Decreased Interest 0 0 0 0 0  Down, Depressed, Hopeless 0 0 0 0 0  PHQ - 2 Score 0 0 0 0 0  Altered sleeping 0 2 0 0 1  Tired, decreased energy 0 0 0 1 0  Change in appetite 0 1 0 0 0  Feeling bad or failure about yourself  0 0 0 0 0  Trouble concentrating 0 0 0 0 1  Moving slowly or fidgety/restless 0 0 0 0 0    Suicidal thoughts 0 0 0 0 0  PHQ-9 Score 0 3 0 1 2  Difficult doing work/chores Not difficult at all Not difficult at all Not difficult at all Not difficult at all Not difficult at all    Past Medical History:  Past Medical History:  Diagnosis Date  . Cataract   . Diabetes mellitus without complication ([place])   . Hypertension   . Neuromuscular disorder [facility])     Surgical History:  Past Surgical History:  Procedure Laterality Date  . catarac surgery Left 01/07/2013  . CHOLECYSTECTOMY    . EYE SURGERY Right 06/2017   Cataract    Medications:  Current Outpatient Medications on File Prior to Visit  Medication Sig  . hydrocortisone (ANUSOL-HC) 2.5 % rectal cream Place 1 application rectally 2 (two) times daily.  Marland Kitchen loratadine (CLARITIN) 10 MG tablet Take 1 tablet (10 mg total) by mouth daily.  . Multiple Vitamin (MULTI-VITAMINS) TABS Take by mouth.  . triamcinolone ointment (KENALOG) 0.5 % Apply 1 application 2 (two) times daily topically.   No current facility-administered medications on file prior to visit.    Allergies:  No Known Allergies  Social History:  Social History   Socioeconomic History  . Marital status: Married    Spouse name: Not on file  . Number of children: Not on file  . Years of education: Not on file  . Highest education level: Not on file  Occupational History  . Not on file  Tobacco Use  . Smoking status: Never Smoker  . Smokeless tobacco: Never Used  Substance and Sexual Activity  . Alcohol use: Yes    Comment: Occasional glass of wine  . Drug use: No  . Sexual activity: Yes  Other Topics Concern  . Not on file  Social History Narrative  . Not on file   Social Determinants of Health   Financial Resource Strain:   . Difficulty of Paying Living Expenses:   Food Insecurity:   . Worried About Programme researcher, broadcasting/film/video in the Last Year:   . Barista in the Last Year:   Transportation Needs:   . Freight forwarder (Medical):   Marland Kitchen  Lack of Transportation (Non-Medical):   Physical Activity:   . Days of Exercise per Week:   . Minutes of Exercise per Session:   Stress:   . Feeling of Stress :   Social Connections:   . Frequency of Communication with Friends and Family:   . Frequency of Social Gatherings with Friends and Family:   . Attends Religious Services:   . Active Member of Clubs or Organizations:   . Attends Banker Meetings:   Marland Kitchen Marital Status:   Intimate Partner Violence:   . Fear of Current or Ex-Partner:   . Emotionally Abused:   Marland Kitchen Physically Abused:   . Sexually Abused:    Social History   Tobacco Use  Smoking Status Never Smoker  Smokeless Tobacco Never Used   Social History   Substance and Sexual Activity  Alcohol Use Yes   Comment: Occasional glass of wine    Family History:  Family History  Adopted: Yes    Past medical history, surgical history, medications, allergies, family history and social history reviewed with patient today and changes made to appropriate areas of the chart.   Review of Systems  Constitutional: Negative.   HENT: Negative.   Eyes: Negative.   Respiratory: Negative.   Cardiovascular: Negative.   Gastrointestinal: Negative.   Genitourinary: Negative.   Musculoskeletal: Negative.   Skin: Negative.   Neurological: Negative.   Endo/Heme/Allergies: Positive for environmental allergies. Negative for polydipsia. Does not bruise/bleed easily.  Psychiatric/Behavioral: Negative.     All other ROS negative except what is listed above and in the HPI.      Objective:    BP 115/76 (BP Location: Left Arm, Patient Position: Sitting, Cuff Size: Normal)   Pulse 74   Temp 97.8 F (36.6 C) (Oral)   Ht 5' 4.37" (1.635 m)   Wt 184 lb 12.8 oz (83.8 kg)   SpO2 97%   BMI 31.36 kg/m   Wt Readings from Last 3 Encounters:  04/15/19 184 lb 12.8 oz (83.8 kg)  09/21/18 185 lb (83.9 kg)  03/10/18 185 lb (83.9 kg)    Physical Exam Vitals and nursing note  reviewed.  Constitutional:      General: She is not in acute distress.    Appearance: Normal appearance. She is not ill-appearing, toxic-appearing or diaphoretic.  HENT:     Head: Normocephalic and atraumatic.     Right Ear: Tympanic membrane, ear canal and external ear normal. There is no impacted cerumen.     Left Ear: Tympanic membrane, ear canal and external ear normal. There is no impacted cerumen.     Nose: Nose normal. No congestion or rhinorrhea.     Mouth/Throat:     Mouth: Mucous membranes are moist.     Pharynx: Oropharynx is clear. No oropharyngeal exudate or posterior oropharyngeal erythema.  Eyes:     General: No scleral  icterus.       Right eye: No discharge.        Left eye: No discharge.     Extraocular Movements: Extraocular movements intact.     Conjunctiva/sclera: Conjunctivae normal.     Pupils: Pupils are equal, round, and reactive to light.  Neck:     Vascular: No carotid bruit.  Cardiovascular:     Rate and Rhythm: Normal rate and regular rhythm.     Pulses: Normal pulses.     Heart sounds: No murmur. No friction rub. No gallop.   Pulmonary:     Effort: Pulmonary effort is normal. No respiratory distress.     Breath sounds: Normal breath sounds. No stridor. No wheezing, rhonchi or rales.  Chest:     Chest wall: No tenderness.  Abdominal:     General: Abdomen is flat. Bowel sounds are normal. There is no distension.     Palpations: Abdomen is soft. There is no mass.     Tenderness: There is no abdominal tenderness. There is no right CVA tenderness, left CVA tenderness, guarding or rebound.     Hernia: No hernia is present.  Genitourinary:    Comments: Breast and pelvic exams deferred with shared decision making Musculoskeletal:        General: No swelling, tenderness, deformity or signs of injury.     Cervical back: Normal range of motion and neck supple. No rigidity. No muscular tenderness.     Right lower leg: No edema.     Left lower leg: No edema.    Lymphadenopathy:     Cervical: No cervical adenopathy.  Skin:    General: Skin is warm and dry.     Capillary Refill: Capillary refill takes less than 2 seconds.     Coloration: Skin is not jaundiced or pale.     Findings: No bruising, erythema, lesion or rash.  Neurological:     General: No focal deficit present.     Mental Status: She is alert and oriented to person, place, and time. Mental status is at baseline.     Cranial Nerves: No cranial nerve deficit.     Sensory: No sensory deficit.     Motor: No weakness.     Coordination: Coordination normal.     Gait: Gait normal.     Deep Tendon Reflexes: Reflexes normal.  Psychiatric:        Mood and Affect: Mood normal.        Behavior: Behavior normal.        Thought Content: Thought content normal.        Judgment: Judgment normal.     Results for orders placed or performed in visit on 04/08/19  HM MAMMOGRAPHY  Result Value Ref Range   HM Mammogram 0-4 Bi-Rad 0-4 Bi-Rad, Self Reported Normal      Assessment & Plan:   Problem List Items Addressed This Visit      Cardiovascular and Mediastinum   Hypertension    Under good control on current regimen. Continue current regimen. Continue to monitor. Call with any concerns. Refills given. Labs drawn today.       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 Continue current regimen. Continue to monitor. Call with any concerns. Refills given. Labs drawn today.      Relevant Medications   aspirin EC 81 MG tablet   glimepiride (AMARYL) 1 MG tablet   empagliflozin (JARDIANCE) 25 MG TABS tablet   losartan (COZAAR) 50 MG tablet    metFORMIN (GLUCOPHAGE) 500 MG tablet   pravastatin (PRAVACHOL) 20 MG tablet   Type 2 diabetes mellitus (HCC)    Under good control on current regimen with a1c of 7.4. Continue current regimen. Continue to monitor. Call with any concerns. Refills given. Labs drawn today. Consider trulicity if A1c still >7 in 3 months.       Relevant Medications   aspirin EC 81 MG tablet   glimepiride (AMARYL) 1 MG tablet   empagliflozin (JARDIANCE) 25 MG TABS tablet   losartan (COZAAR) 50 MG tablet   metFORMIN (GLUCOPHAGE) 500 MG tablet   pravastatin (PRAVACHOL) 20 MG tablet   Other Relevant Orders   Bayer DCA Hb A1c Waived   CBC with Differential/Platelet   Comprehensive metabolic panel   Microalbumin, Urine Waived   UA/M w/rflx Culture, Routine   Hyperlipidemia associated with type 2 diabetes mellitus (HCC)    Under good control on current regimen. Continue current regimen. Continue to monitor. Call with any concerns. Refills given. Labs drawn today.      Relevant Medications   aspirin EC 81 MG tablet   glimepiride (AMARYL) 1 MG tablet   empagliflozin (JARDIANCE) 25 MG TABS tablet   losartan (COZAAR) 50 MG tablet   metFORMIN (GLUCOPHAGE) 500 MG tablet   pravastatin (PRAVACHOL) 20 MG tablet   Other Relevant Orders   CBC with Differential/Platelet   Comprehensive metabolic panel   Lipid Panel w/o Chol/HDL Ratio    Other Visit Diagnoses    Routine general medical examination at a health care facility    -  Primary   Vaccines up to date. Screening labs checked today. Pap, mammo and colonoscopy up to date. Continue diet and exercise. Call with any concerns.   Relevant Orders   Bayer DCA Hb A1c Waived   CBC with Differential/Platelet   Comprehensive metabolic panel   Lipid Panel w/o Chol/HDL Ratio   Microalbumin, Urine Waived   TSH   UA/M w/rflx Culture, Routine       Follow up plan: Return in about 3 months (around 07/15/2019).   LABORATORY TESTING:  - Pap smear: up to  date  IMMUNIZATIONS:   - Tdap: Tetanus vaccination status reviewed: last tetanus booster within 10 years. - Influenza: Up to date - Pneumovax: Up to date  SCREENING: -Mammogram: Up to date  - Colonoscopy: Up to date   PATIENT COUNSELING:   Advised to take 1 mg of folate supplement per day if capable of pregnancy.   Sexuality: Discussed sexually transmitted diseases, partner selection, use of condoms, avoidance of unintended pregnancy  and contraceptive alternatives.   Advised to avoid cigarette smoking.  I discussed with the patient that most people either abstain from alcohol or drink within safe limits (<=14/week and <=4 drinks/occasion for males, <=7/weeks and <= 3 drinks/occasion for females) and that the risk for alcohol disorders and other health effects rises proportionally with the number of drinks per week and how often a drinker exceeds daily limits.  Discussed cessation/primary prevention of drug use and availability of treatment for abuse.   Diet: Encouraged to adjust caloric intake to maintain  or achieve ideal body weight, to reduce intake of dietary saturated fat and total fat, to limit sodium intake by avoiding high sodium foods and not adding table salt, and to maintain adequate dietary potassium and calcium preferably from fresh fruits, vegetables, and low-fat dairy products.    stressed the importance of regular exercise  Injury prevention: Discussed safety belts, safety helmets, smoke detector, smoking near bedding or upholstery.   Dental health: Discussed importance of regular tooth brushing, flossing, and dental visits.    NEXT PREVENTATIVE PHYSICAL DUE IN 1 YEAR. Return in about 3 months (around 07/15/2019).

## 2019-04-16 LAB — LIPID PANEL W/O CHOL/HDL RATIO
Cholesterol, Total: 177 mg/dL (ref 100–199)
HDL: 37 mg/dL — ABNORMAL LOW (ref >39)
LDL Chol Calc (NIH): 107 mg/dL — ABNORMAL HIGH (ref 0–99)
Triglycerides: 186 mg/dL — ABNORMAL HIGH (ref 0–149)
VLDL Cholesterol Cal: 33 mg/dL (ref 5–40)

## 2019-04-16 LAB — COMPREHENSIVE METABOLIC PANEL
ALT: 20 IU/L (ref 0–32)
AST: 24 IU/L (ref 0–40)
Albumin/Globulin Ratio: 1.3 (ref 1.2–2.2)
Albumin: 4.2 g/dL (ref 3.8–4.9)
Alkaline Phosphatase: 81 IU/L (ref 39–117)
BUN/Creatinine Ratio: 19 (ref 9–23)
BUN: 15 mg/dL (ref 6–24)
Bilirubin Total: 0.4 mg/dL (ref 0.0–1.2)
CO2: 23 mmol/L (ref 20–29)
Calcium: 9.3 mg/dL (ref 8.7–10.2)
Chloride: 96 mmol/L (ref 96–106)
Creatinine, Ser: 0.78 mg/dL (ref 0.57–1.00)
GFR calc Af Amer: 98 mL/min/{1.73_m2} (ref >59)
GFR calc non Af Amer: 85 mL/min/{1.73_m2} (ref >59)
Globulin, Total: 3.2 g/dL (ref 1.5–4.5)
Glucose: 147 mg/dL — ABNORMAL HIGH (ref 65–99)
Potassium: 3.6 mmol/L (ref 3.5–5.2)
Sodium: 135 mmol/L (ref 134–144)
Total Protein: 7.4 g/dL (ref 6.0–8.5)

## 2019-04-16 LAB — CBC WITH DIFFERENTIAL/PLATELET
Basophils Absolute: 0.1 10*3/uL (ref 0.0–0.2)
Basos: 1 %
EOS (ABSOLUTE): 0.6 10*3/uL — ABNORMAL HIGH (ref 0.0–0.4)
Eos: 9 %
Hematocrit: 43.6 % (ref 34.0–46.6)
Hemoglobin: 14.7 g/dL (ref 11.1–15.9)
Immature Grans (Abs): 0 10*3/uL (ref 0.0–0.1)
Immature Granulocytes: 0 %
Lymphocytes Absolute: 1.7 10*3/uL (ref 0.7–3.1)
Lymphs: 24 %
MCH: 29.9 pg (ref 26.6–33.0)
MCHC: 33.7 g/dL (ref 31.5–35.7)
MCV: 89 fL (ref 79–97)
Monocytes Absolute: 0.6 10*3/uL (ref 0.1–0.9)
Monocytes: 8 %
Neutrophils Absolute: 4.2 10*3/uL (ref 1.4–7.0)
Neutrophils: 58 %
Platelets: 296 10*3/uL (ref 150–450)
RBC: 4.92 x10E6/uL (ref 3.77–5.28)
RDW: 13 % (ref 11.7–15.4)
WBC: 7.2 10*3/uL (ref 3.4–10.8)

## 2019-04-16 LAB — TSH: TSH: 2.12 u[IU]/mL (ref 0.450–4.500)

## 2019-05-31 ENCOUNTER — Encounter: Payer: Self-pay | Admitting: Family Medicine

## 2019-07-02 ENCOUNTER — Other Ambulatory Visit: Payer: Self-pay | Admitting: Family Medicine

## 2019-07-07 ENCOUNTER — Other Ambulatory Visit: Payer: Self-pay | Admitting: Family Medicine

## 2019-07-20 ENCOUNTER — Encounter: Payer: Self-pay | Admitting: Family Medicine

## 2019-07-20 ENCOUNTER — Other Ambulatory Visit: Payer: Self-pay

## 2019-07-20 ENCOUNTER — Ambulatory Visit (INDEPENDENT_AMBULATORY_CARE_PROVIDER_SITE_OTHER): Payer: 59 | Admitting: Family Medicine

## 2019-07-20 VITALS — BP 136/83 | HR 81 | Temp 97.9°F

## 2019-07-20 DIAGNOSIS — Z1152 Encounter for screening for COVID-19: Secondary | ICD-10-CM

## 2019-07-20 DIAGNOSIS — E1149 Type 2 diabetes mellitus with other diabetic neurological complication: Secondary | ICD-10-CM

## 2019-07-20 LAB — BAYER DCA HB A1C WAIVED: HB A1C (BAYER DCA - WAIVED): 7.3 % — ABNORMAL HIGH (ref <7.0)

## 2019-07-20 MED ORDER — TRULICITY 0.75 MG/0.5ML ~~LOC~~ SOAJ: 0.7500 mg | SUBCUTANEOUS | 3 refills | Status: DC

## 2019-07-20 NOTE — Assessment & Plan Note (Signed)
Doing better, but still running high at 7.3. Will start 0.75mg  trulicity and recheck A1c in 3 months. Call with any concerns.

## 2019-07-20 NOTE — Progress Notes (Signed)
BP 136/83 (BP Location: Left Arm, Patient Position: Sitting, Cuff Size: Normal)   Pulse 81   Temp 97.9 F (36.6 C) (Oral)   SpO2 97%    Subjective:    Patient ID: Lori Horne, female    DOB: April 06, 1965, 54 y.o.   MRN: [id number]  HPI: Lori Horne is a 54 y.o. female  Chief Complaint  Patient presents with  . Diabetes   DIABETES Hypoglycemic episodes:no Polydipsia/polyuria: no Visual disturbance: no Chest pain: no Paresthesias: no Glucose Monitoring: yes  Accucheck frequency: 2-3x a week  Fasting glucose: 120-150 Taking Insulin?: no Blood Pressure Monitoring: not checking Retinal Examination: Not up to Date Foot Exam: Up to Date Diabetic Education: Completed Pneumovax: Up to Date Influenza: Up to Date Aspirin: yes  Relevant past medical, surgical, family and social history reviewed and updated as indicated. Interim medical history since our last visit reviewed. Allergies and medications reviewed and updated.  Review of Systems  Constitutional: Negative.   Respiratory: Negative.   Cardiovascular: Negative.   Gastrointestinal: Negative.   Musculoskeletal: Negative.   Skin: Negative.   Psychiatric/Behavioral: Negative.     Per HPI unless specifically indicated above     Objective:    BP 136/83 (BP Location: Left Arm, Patient Position: Sitting, Cuff Size: Normal)   Pulse 81   Temp 97.9 F (36.6 C) (Oral)   SpO2 97%   Wt Readings from Last 3 Encounters:  04/15/19 184 lb 12.8 oz (83.8 kg)  09/21/18 185 lb (83.9 kg)  03/10/18 185 lb (83.9 kg)    Physical Exam Vitals and nursing note reviewed.  Constitutional:      General: She is not in acute distress.    Appearance: Normal appearance. She is not ill-appearing, toxic-appearing or diaphoretic.  HENT:     Head: Normocephalic and atraumatic.     Right Ear: External ear normal.     Left Ear: External ear normal.     Nose: Nose normal.     Mouth/Throat:     Mouth: Mucous membranes are moist.      Pharynx: Oropharynx is clear.  Eyes:     General: No scleral icterus.       Right eye: No discharge.        Left eye: No discharge.     Extraocular Movements: Extraocular movements intact.     Conjunctiva/sclera: Conjunctivae normal.     Pupils: Pupils are equal, round, and reactive to light.  Cardiovascular:     Rate and Rhythm: Normal rate and regular rhythm.     Pulses: Normal pulses.     Heart sounds: Normal heart sounds. No murmur heard.  No friction rub. No gallop.   Pulmonary:     Effort: Pulmonary effort is normal. No respiratory distress.     Breath sounds: Normal breath sounds. No stridor. No wheezing, rhonchi or rales.  Chest:     Chest wall: No tenderness.  Musculoskeletal:        General: Normal range of motion.     Cervical back: Normal range of motion and neck supple.  Skin:    General: Skin is warm and dry.     Capillary Refill: Capillary refill takes less than 2 seconds.     Coloration: Skin is not jaundiced or pale.     Findings: No bruising, erythema, lesion or rash.  Neurological:     General: No focal deficit present.     Mental Status: She is alert and oriented to person, place, and time. Mental  status is at baseline.  Psychiatric:        Mood and Affect: Mood normal.        Behavior: Behavior normal.        Thought Content: Thought content normal.        Judgment: Judgment normal.     Results for orders placed or performed in visit on 04/15/19  Microscopic Examination   BLD  Result Value Ref Range   WBC, UA 0-5 0 - 5 /hpf   RBC 3-10 (A) 0 - 2 /hpf   Epithelial Cells (non renal) 0-10 0 - 10 /hpf   Bacteria, UA None seen None seen/Few  Bayer DCA Hb A1c Waived  Result Value Ref Range   HB A1C (BAYER DCA - WAIVED) 7.4 (H) <7.0 %  CBC with Differential/Platelet  Result Value Ref Range   WBC 7.2 3.4 - 10.8 x10E3/uL   RBC 4.92 3.77 - 5.28 x10E6/uL   Hemoglobin 14.7 11.1 - 15.9 g/dL   Hematocrit 78.2 95.6 - 46.6 %   MCV 89 79 - 97 fL   MCH 29.9  26.6 - 33.0 pg   MCHC 33.7 31 - 35 g/dL   RDW 21.3 08.6 - 57.8 %   Platelets 296 150 - 450 x10E3/uL   Neutrophils 58 Not Estab. %   Lymphs 24 Not Estab. %   Monocytes 8 Not Estab. %   Eos 9 Not Estab. %   Basos 1 Not Estab. %   Neutrophils Absolute 4.2 1 - 7 x10E3/uL   Lymphocytes Absolute 1.7 0 - 3 x10E3/uL   Monocytes Absolute 0.6 0 - 0 x10E3/uL   EOS (ABSOLUTE) 0.6 (H) 0.0 - 0.4 x10E3/uL   Basophils Absolute 0.1 0 - 0 x10E3/uL   Immature Granulocytes 0 Not Estab. %   Immature Grans (Abs) 0.0 0.0 - 0.1 x10E3/uL  Comprehensive metabolic panel  Result Value Ref Range   Glucose 147 (H) 65 - 99 mg/dL   BUN 15 6 - 24 mg/dL   Creatinine, Ser 4.69 0.57 - 1.00 mg/dL   GFR calc non Af Amer 85 >59 mL/min/1.73   GFR calc Af Amer 98 >59 mL/min/1.73   BUN/Creatinine Ratio 19 9 - 23   Sodium 135 134 - 144 mmol/L   Potassium 3.6 3.5 - 5.2 mmol/L   Chloride 96 96 - 106 mmol/L   CO2 23 20 - 29 mmol/L   Calcium 9.3 8.7 - 10.2 mg/dL   Total Protein 7.4 6.0 - 8.5 g/dL   Albumin 4.2 3.8 - 4.9 g/dL   Globulin, Total 3.2 1.5 - 4.5 g/dL   Albumin/Globulin Ratio 1.3 1.2 - 2.2   Bilirubin Total 0.4 0.0 - 1.2 mg/dL   Alkaline Phosphatase 81 39 - 117 IU/L   AST 24 0 - 40 IU/L   ALT 20 0 - 32 IU/L  Lipid Panel w/o Chol/HDL Ratio  Result Value Ref Range   Cholesterol, Total 177 100 - 199 mg/dL   Triglycerides 629 (H) 0 - 149 mg/dL   HDL 37 (L) >52 mg/dL   VLDL Cholesterol Cal 33 5 - 40 mg/dL   LDL Chol Calc (NIH) 841 (H) 0 - 99 mg/dL  Microalbumin, Urine Waived  Result Value Ref Range   Microalb, Ur Waived 30 (H) 0 - 19 mg/L   Creatinine, Urine Waived 50 10 - 300 mg/dL   Microalb/Creat Ratio 30-300 (H) <30 mg/g  TSH  Result Value Ref Range   TSH 2.120 0.450 - 4.500 uIU/mL  UA/M w/rflx  Culture, Routine   Specimen: Blood   BLD  Result Value Ref Range   Specific Gravity, UA 1.015 1.005 - 1.030   pH, UA 5.5 5.0 - 7.5   Color, UA Yellow Yellow   Appearance Ur Clear Clear   Leukocytes,UA  Negative Negative   Protein,UA Negative Negative/Trace   Glucose, UA 3+ (A) Negative   Ketones, UA Negative Negative   RBC, UA Trace (A) Negative   Bilirubin, UA Negative Negative   Urobilinogen, Ur 1.0 0.2 - 1.0 mg/dL   Nitrite, UA Negative Negative   Microscopic Examination See below:       Assessment & Plan:   Problem List Items Addressed This Visit      Endocrine   Type 2 diabetes mellitus (HCC) - Primary    Doing better, but still running high at 7.3. Will start 0.75mg  trulicity and recheck A1c in 3 months. Call with any concerns.       Relevant Medications   Dulaglutide (TRULICITY) 0.75 MG/0.5ML SOPN   Other Relevant Orders   Bayer DCA Hb A1c Waived    Other Visit Diagnoses    Encounter for screening for COVID-19       Needs a covid antibody for work. Drawn today.   Relevant Orders   SAR CoV2 Serology (COVID 19)AB(IGG)IA       Follow up plan: Return in about 3 months (around 10/20/2019).

## 2019-07-21 LAB — SAR COV2 SEROLOGY (COVID19)AB(IGG),IA: DiaSorin SARS-CoV-2 Ab, IgG: NEGATIVE

## 2019-07-23 LAB — SPECIMEN STATUS REPORT

## 2019-07-23 LAB — SARS-COV-2 SEMI-QUANTITATIVE TOTAL ANTIBODY, SPIKE
SARS-CoV-2 Semi-Quant Total Ab: <0.4 U/mL (ref <0.8)
SARS-CoV-2 Spike Ab Interp: NEGATIVE

## 2019-09-27 ENCOUNTER — Encounter: Payer: Self-pay | Admitting: Family Medicine

## 2019-10-07 ENCOUNTER — Other Ambulatory Visit: Payer: Self-pay | Admitting: Family Medicine

## 2019-10-15 ENCOUNTER — Other Ambulatory Visit: Payer: Self-pay | Admitting: Family Medicine

## 2019-10-20 ENCOUNTER — Other Ambulatory Visit: Payer: Self-pay

## 2019-10-20 ENCOUNTER — Ambulatory Visit (INDEPENDENT_AMBULATORY_CARE_PROVIDER_SITE_OTHER): Payer: 59 | Admitting: Family Medicine

## 2019-10-20 ENCOUNTER — Encounter: Payer: Self-pay | Admitting: Family Medicine

## 2019-10-20 VITALS — BP 128/80 | HR 88 | Temp 98.0°F | Wt 186.0 lb

## 2019-10-20 DIAGNOSIS — I10 Essential (primary) hypertension: Secondary | ICD-10-CM | POA: Diagnosis not present

## 2019-10-20 DIAGNOSIS — E1169 Type 2 diabetes mellitus with other specified complication: Secondary | ICD-10-CM | POA: Diagnosis not present

## 2019-10-20 DIAGNOSIS — E1149 Type 2 diabetes mellitus with other diabetic neurological complication: Secondary | ICD-10-CM | POA: Diagnosis not present

## 2019-10-20 DIAGNOSIS — E785 Hyperlipidemia, unspecified: Secondary | ICD-10-CM

## 2019-10-20 DIAGNOSIS — J301 Allergic rhinitis due to pollen: Secondary | ICD-10-CM

## 2019-10-20 LAB — BAYER DCA HB A1C WAIVED: HB A1C (BAYER DCA - WAIVED): 6.6 % (ref <7.0)

## 2019-10-20 MED ORDER — METFORMIN HCL 500 MG PO TABS
ORAL_TABLET | ORAL | 1 refills | Status: DC
Start: 2019-10-20 — End: 2020-01-10

## 2019-10-20 MED ORDER — LEVOCETIRIZINE DIHYDROCHLORIDE 5 MG PO TABS: 5.0000 mg | ORAL_TABLET | Freq: Every evening | ORAL | 4 refills | Status: DC

## 2019-10-20 MED ORDER — TRULICITY 1.5 MG/0.5ML ~~LOC~~ SOAJ: 1.5000 mg | SUBCUTANEOUS | 1 refills | Status: DC

## 2019-10-20 NOTE — Assessment & Plan Note (Signed)
Doing great with A1c of 6.6. Will streamline meds- stop glimiperide due to low. Increase trulicity and stop jardiance. Continue metformin. Recheck 3 months. Call with any concerns.

## 2019-10-20 NOTE — Assessment & Plan Note (Signed)
Under good control on current regimen. Continue current regimen. Continue to monitor. Call with any concerns. Refills given. Labs drawn today.   

## 2019-10-20 NOTE — Progress Notes (Signed)
BP 128/80   Pulse 88   Temp 98 F (36.7 C) (Oral)   Wt 186 lb (84.4 kg)   SpO2 98%   BMI 31.56 kg/m    Subjective:    Patient ID: Lori Horne, female    DOB: [birth year], 54 y.o.   MRN: [id number]  HPI: Lori Horne is a 54 y.o. female  Chief Complaint  Patient presents with  . Diabetes  . Hyperlipidemia  . Hypertension   HYPERTENSION / HYPERLIPIDEMIA Satisfied with current treatment? yes Duration of hypertension: chronic BP monitoring frequency: not checking BP medication side effects: no Past BP meds: losartan, HCTZ Duration of hyperlipidemia: chronic Cholesterol medication side effects: no Cholesterol supplements: none Past cholesterol medications: pravastatin Medication compliance: excellent compliance Aspirin: yes Recent stressors: no Recurrent headaches: no Visual changes: no Palpitations: no Dyspnea: no Chest pain: no Lower extremity edema: no Dizzy/lightheaded: no  DIABETES Hypoglycemic episodes:yes Polydipsia/polyuria: no Visual disturbance: no Chest pain: no Paresthesias: no Glucose Monitoring: yes  Accucheck frequency: occasionally Taking Insulin?: no Blood Pressure Monitoring: not checking Retinal Examination: Not up to Date Foot Exam: Up to Date Diabetic Education: Completed Pneumovax: Up to Date Influenza: Declined Aspirin: yes  Relevant past medical, surgical, family and social history reviewed and updated as indicated. Interim medical history since our last visit reviewed. Allergies and medications reviewed and updated.  Review of Systems  Constitutional: Negative.   Respiratory: Negative.   Cardiovascular: Negative.   Gastrointestinal: Negative.   Musculoskeletal: Negative.   Psychiatric/Behavioral: Negative.     Per HPI unless specifically indicated above     Objective:    BP 128/80   Pulse 88   Temp 98 F (36.7 C) (Oral)   Wt 186 lb (84.4 kg)   SpO2 98%   BMI 31.56 kg/m   Wt Readings from Last 3 Encounters:    10/20/19 186 lb (84.4 kg)  04/15/19 184 lb 12.8 oz (83.8 kg)  09/21/18 185 lb (83.9 kg)    Physical Exam Vitals and nursing note reviewed.  Constitutional:      General: She is not in acute distress.    Appearance: Normal appearance. She is not ill-appearing, toxic-appearing or diaphoretic.  HENT:     Head: Normocephalic and atraumatic.     Right Ear: External ear normal.     Left Ear: External ear normal.     Nose: Nose normal.     Mouth/Throat:     Mouth: Mucous membranes are moist.     Pharynx: Oropharynx is clear.  Eyes:     General: No scleral icterus.       Right eye: No discharge.        Left eye: No discharge.     Extraocular Movements: Extraocular movements intact.     Conjunctiva/sclera: Conjunctivae normal.     Pupils: Pupils are equal, round, and reactive to light.  Cardiovascular:     Rate and Rhythm: Normal rate and regular rhythm.     Pulses: Normal pulses.     Heart sounds: Normal heart sounds. No murmur heard.  No friction rub. No gallop.   Pulmonary:     Effort: Pulmonary effort is normal. No respiratory distress.     Breath sounds: Normal breath sounds. No stridor. No wheezing, rhonchi or rales.  Chest:     Chest wall: No tenderness.  Musculoskeletal:        General: Normal range of motion.     Cervical back: Normal range of motion and neck supple.  Skin:  General: Skin is warm and dry.     Capillary Refill: Capillary refill takes less than 2 seconds.     Coloration: Skin is not jaundiced or pale.     Findings: No bruising, erythema, lesion or rash.  Neurological:     General: No focal deficit present.     Mental Status: She is alert and oriented to person, place, and time. Mental status is at baseline.  Psychiatric:        Mood and Affect: Mood normal.        Behavior: Behavior normal.        Thought Content: Thought content normal.        Judgment: Judgment normal.     Results for orders placed or performed in visit on 07/20/19  Bayer DCA  Hb A1c Waived  Result Value Ref Range   HB A1C (BAYER DCA - WAIVED) 7.3 (H) <7.0 %  SAR CoV2 Serology (COVID 19)AB(IGG)IA  Result Value Ref Range   DiaSorin SARS-CoV-2 Ab, IgG Negative Negative  SARS-CoV-2 Semi-Quantitative Total Antibody, Spike  Result Value Ref Range   SARS-CoV-2 Semi-Quant Total Ab <0.4 <0.8 U/mL   SARS-CoV-2 Spike Ab Interp Negative   Specimen status report  Result Value Ref Range   specimen status report Comment       Assessment & Plan:   Problem List Items Addressed This Visit      Cardiovascular and Mediastinum   Hypertension    Under good control on current regimen. Continue current regimen. Continue to monitor. Call with any concerns. Refills given. Labs drawn today.         Endocrine   Type 2 diabetes mellitus (HCC) - Primary    Doing great with A1c of 6.6. Will streamline meds- stop glimiperide due to low. Increase trulicity and stop jardiance. Continue metformin. Recheck 3 months. Call with any concerns.       Relevant Medications   Dulaglutide (TRULICITY) 1.5 MG/0.5ML SOPN   metFORMIN (GLUCOPHAGE) 500 MG tablet   Other Relevant Orders   Bayer DCA Hb A1c Waived   Comprehensive metabolic panel   Lipid Panel w/o Chol/HDL Ratio   Hyperlipidemia associated with type 2 diabetes mellitus (HCC)    Under good control on current regimen. Continue current regimen. Continue to monitor. Call with any concerns. Refills given. Labs drawn today.       Relevant Medications   Dulaglutide (TRULICITY) 1.5 MG/0.5ML SOPN   metFORMIN (GLUCOPHAGE) 500 MG tablet   Other Relevant Orders   Comprehensive metabolic panel   Lipid Panel w/o Chol/HDL Ratio    Other Visit Diagnoses    Seasonal allergic rhinitis due to pollen       Will treat with xyzal. If not getting better or getting worse. Let us know.        Follow up plan: Return in about 3 months (around 01/20/2020).

## 2019-10-21 LAB — LIPID PANEL W/O CHOL/HDL RATIO
Cholesterol, Total: 175 mg/dL (ref 100–199)
HDL: 29 mg/dL — ABNORMAL LOW (ref >39)
LDL Chol Calc (NIH): 72 mg/dL (ref 0–99)
Triglycerides: 473 mg/dL — ABNORMAL HIGH (ref 0–149)
VLDL Cholesterol Cal: 74 mg/dL — ABNORMAL HIGH (ref 5–40)

## 2019-10-21 LAB — COMPREHENSIVE METABOLIC PANEL
ALT: 13 IU/L (ref 0–32)
AST: 13 IU/L (ref 0–40)
Albumin/Globulin Ratio: 1.3 (ref 1.2–2.2)
Albumin: 4 g/dL (ref 3.8–4.9)
Alkaline Phosphatase: 82 IU/L (ref 44–121)
BUN/Creatinine Ratio: 20 (ref 9–23)
BUN: 14 mg/dL (ref 6–24)
Bilirubin Total: <0.2 mg/dL (ref 0.0–1.2)
CO2: 24 mmol/L (ref 20–29)
Calcium: 9.3 mg/dL (ref 8.7–10.2)
Chloride: 98 mmol/L (ref 96–106)
Creatinine, Ser: 0.69 mg/dL (ref 0.57–1.00)
GFR calc Af Amer: 113 mL/min/{1.73_m2} (ref >59)
GFR calc non Af Amer: 98 mL/min/{1.73_m2} (ref >59)
Globulin, Total: 3.1 g/dL (ref 1.5–4.5)
Glucose: 230 mg/dL — ABNORMAL HIGH (ref 65–99)
Potassium: 4.2 mmol/L (ref 3.5–5.2)
Sodium: 139 mmol/L (ref 134–144)
Total Protein: 7.1 g/dL (ref 6.0–8.5)

## 2019-10-25 ENCOUNTER — Encounter: Payer: Self-pay | Admitting: Family Medicine

## 2019-10-25 ENCOUNTER — Telehealth: Payer: Self-pay

## 2019-10-25 NOTE — Telephone Encounter (Signed)
Form placed in providers folder for completion and signature.  °

## 2019-10-25 NOTE — Telephone Encounter (Signed)
Pt brought in paper work from [facility] to be filled.Form left in bin to be reviewed.

## 2019-10-26 NOTE — Telephone Encounter (Signed)
Pt is calling checking on the paperwork from [facility] she dropped off yesterday

## 2019-10-29 ENCOUNTER — Telehealth: Payer: Self-pay

## 2019-10-29 NOTE — Telephone Encounter (Signed)
Pt is calling and the paperwork is due today

## 2019-10-29 NOTE — Telephone Encounter (Signed)
Form is still in providers folder for signature.  

## 2019-10-29 NOTE — Telephone Encounter (Signed)
FYI

## 2019-10-29 NOTE — Telephone Encounter (Signed)
[facility] form completed and signed by Dr. Laural Benes. Faxed in for patient. Called and let her know that this was done. Copy in scan and original up front for patient pick up.

## 2019-12-22 ENCOUNTER — Encounter: Payer: Self-pay | Admitting: Family Medicine

## 2019-12-24 ENCOUNTER — Other Ambulatory Visit: Payer: Self-pay | Admitting: Family Medicine

## 2019-12-24 NOTE — Telephone Encounter (Signed)
Routing to provider  

## 2019-12-24 NOTE — Telephone Encounter (Signed)
Requested medications are due for refill today yes  Requested medications are on the active medication list yes  Last refill 9/5  Last visit 09/2018  Future visit scheduled 01/11/20  Notes to clinic Failed protocol due to no valid visit within 6  months.

## 2020-01-09 ENCOUNTER — Other Ambulatory Visit: Payer: Self-pay | Admitting: Family Medicine

## 2020-01-15 ENCOUNTER — Other Ambulatory Visit: Payer: Self-pay | Admitting: Family Medicine

## 2020-01-15 NOTE — Telephone Encounter (Signed)
Requested medications are due for refill today yes  Requested medications are on the active medication list yes  Last refill 10/8  Last visit 10/2019  Future visit scheduled Next week  Notes to clinic Historical Provider

## 2020-01-17 NOTE — Telephone Encounter (Signed)
Routing to provider  

## 2020-01-21 ENCOUNTER — Encounter: Payer: Self-pay | Admitting: Family Medicine

## 2020-01-21 ENCOUNTER — Telehealth (INDEPENDENT_AMBULATORY_CARE_PROVIDER_SITE_OTHER): Payer: 59 | Admitting: Family Medicine

## 2020-01-21 VITALS — Wt 183.0 lb

## 2020-01-21 DIAGNOSIS — E1149 Type 2 diabetes mellitus with other diabetic neurological complication: Secondary | ICD-10-CM | POA: Diagnosis not present

## 2020-01-21 NOTE — Progress Notes (Signed)
Wt 183 lb (83 kg)   BMI 31.05 kg/m    Subjective:    Patient ID: Lori Horne, female    DOB: 07-19-1965, 55 y.o.   MRN: [id number]  HPI: Lori Horne is a 55 y.o. female  Chief Complaint  Patient presents with  . Diabetes    Pt states she has not had a recent eye exam, plans to schedule one soon. Asked for patient to please have them send copy of report once completed.   . Hyperlipidemia  . Hypertension   DIABETES Hypoglycemic episodes:no Polydipsia/polyuria: no Visual disturbance: no Chest pain: no Paresthesias: no Glucose Monitoring: yes  Accucheck frequency: Daily  Fasting glucose: 200+ just for a week, now back in the 160 Taking Insulin?: no Blood Pressure Monitoring: not checking Retinal Examination: Not up to Date Foot Exam: Up to Date Diabetic Education: Completed Pneumovax: Up to Date Influenza: Up to Date Aspirin: yes  Relevant past medical, surgical, family and social history reviewed and updated as indicated. Interim medical history since our last visit reviewed. Allergies and medications reviewed and updated.  Review of Systems  Constitutional: Negative.   HENT: Negative.   Respiratory: Negative.   Cardiovascular: Negative.   Musculoskeletal: Negative.   Psychiatric/Behavioral: Negative.     Per HPI unless specifically indicated above     Objective:    Wt 183 lb (83 kg)   BMI 31.05 kg/m   Wt Readings from Last 3 Encounters:  01/21/20 183 lb (83 kg)  10/20/19 186 lb (84.4 kg)  04/15/19 184 lb 12.8 oz (83.8 kg)    Physical Exam Vitals and nursing note reviewed.  Constitutional:      General: She is not in acute distress.    Appearance: Normal appearance. She is not ill-appearing, toxic-appearing or diaphoretic.  HENT:     Head: Normocephalic and atraumatic.     Right Ear: External ear normal.     Left Ear: External ear normal.     Nose: Nose normal.     Mouth/Throat:     Mouth: Mucous membranes are moist.     Pharynx: Oropharynx  is clear.  Eyes:     General: No scleral icterus.       Right eye: No discharge.        Left eye: No discharge.     Conjunctiva/sclera: Conjunctivae normal.     Pupils: Pupils are equal, round, and reactive to light.  Pulmonary:     Effort: Pulmonary effort is normal. No respiratory distress.     Comments: Speaking in full sentences Musculoskeletal:        General: Normal range of motion.     Cervical back: Normal range of motion.  Skin:    Coloration: Skin is not jaundiced or pale.     Findings: No bruising, erythema, lesion or rash.  Neurological:     Mental Status: She is alert and oriented to person, place, and time. Mental status is at baseline.  Psychiatric:        Mood and Affect: Mood normal.        Behavior: Behavior normal.        Thought Content: Thought content normal.        Judgment: Judgment normal.     Results for orders placed or performed in visit on 10/21/19  HM DIABETES EYE EXAM  Result Value Ref Range   HM Diabetic Eye Exam No Retinopathy No Retinopathy      Assessment & Plan:   Problem List Items  Addressed This Visit      Endocrine   Type 2 diabetes mellitus (HCC) - Primary    Generally doing well with 1 week where she's not sure her shot worked. Will get her in for recheck on A1c. Await results. Treat as needed. Refills up to date. Call with any concerns.       Relevant Orders   Bayer DCA Hb A1c Waived       Follow up plan: Return in about 3 months (around 04/20/2020) for physical.    . This visit was completed via MyChart due to the restrictions of the COVID-19 pandemic. All issues as above were discussed and addressed. Physical exam was done as above through visual confirmation on MyChart. If it was felt that the patient should be evaluated in the office, they were directed there. The patient verbally consented to this visit. . Location of the patient: work . Location of the provider: home . Those involved with this call:  . Provider:  Olevia Perches, DO . CMA: Wilhemena Durie, CMA . Front Desk/Registration: Harriet Pho  . Time spent on call: 15 minutes with patient face to face via video conference. More than 50% of this time was spent in counseling and coordination of care. 23 minutes total spent in review of patient's record and preparation of their chart.

## 2020-01-21 NOTE — Assessment & Plan Note (Signed)
Generally doing well with 1 week where she's not sure her shot worked. Will get her in for recheck on A1c. Await results. Treat as needed. Refills up to date. Call with any concerns.

## 2020-03-03 ENCOUNTER — Encounter: Payer: Self-pay | Admitting: Family Medicine

## 2020-03-03 MED ORDER — HYDROCORTISONE (PERIANAL) 2.5 % EX CREA: 1.0000 "application " | TOPICAL_CREAM | Freq: Two times a day (BID) | CUTANEOUS | 3 refills | Status: DC

## 2020-04-08 ENCOUNTER — Other Ambulatory Visit: Payer: Self-pay | Admitting: Family Medicine

## 2020-04-14 ENCOUNTER — Other Ambulatory Visit: Payer: Self-pay | Admitting: Family Medicine

## 2020-04-14 NOTE — Telephone Encounter (Signed)
Requested Prescriptions  Pending Prescriptions Disp Refills  . hydrochlorothiazide (HYDRODIURIL) 25 MG tablet [Pharmacy Med Name: HYDROCHLOROTHIAZIDE 25MG  TABLETS] 90 tablet 1    Sig: TAKE 1 TABLET(25 MG) BY MOUTH DAILY     Cardiovascular: Diuretics - Thiazide Passed - 04/14/2020  3:17 AM      Passed - Ca in normal range and within 360 days    Calcium  Date Value Ref Range Status  10/20/2019 9.3 8.7 - 10.2 mg/dL Final         Passed - Cr in normal range and within 360 days    Creatinine, Ser  Date Value Ref Range Status  10/20/2019 0.69 0.57 - 1.00 mg/dL Final         Passed - K in normal range and within 360 days    Potassium  Date Value Ref Range Status  10/20/2019 4.2 3.5 - 5.2 mmol/L Final         Passed - Na in normal range and within 360 days    Sodium  Date Value Ref Range Status  10/20/2019 139 134 - 144 mmol/L Final         Passed - Last BP in normal range    BP Readings from Last 1 Encounters:  10/20/19 128/80         Passed - Valid encounter within last 6 months    Recent Outpatient Visits          2 months ago Type 2 diabetes mellitus with other neurologic complication, without long-term current use of insulin (HCC)   [facility] [place], Megan P, DO   5 months ago Type 2 diabetes mellitus with other neurologic complication, without long-term current use of insulin (HCC)   [facility] [place], Megan P, DO   8 months ago Type 2 diabetes mellitus with other neurologic complication, without long-term current use of insulin (HCC)   [facility] [place], Megan P, DO   1 year ago Routine general medical examination at a health care facility   [facility] P, DO   1 year ago Type 2 diabetes mellitus with other neurologic complication, without long-term current use of insulin [facility])   [facility] [place], [place], DO      Future Appointments            In 6 days [place], Laural Benes, DO  [facility]   In 2 weeks [employer], Laural Benes, DO [facility]           . pravastatin (PRAVACHOL) 20 MG tablet [Pharmacy Med Name: PRAVASTATIN 20MG  TABLETS] 90 tablet 1    Sig: TAKE 1 TABLET(20 MG) BY MOUTH DAILY     Cardiovascular:  Antilipid - Statins Failed - 04/14/2020  3:17 AM      Failed - LDL in normal range and within 360 days    LDL Chol Calc (NIH)  Date Value Ref Range Status  10/20/2019 72 0 - 99 mg/dL Final         Failed - HDL in normal range and within 360 days    HDL  Date Value Ref Range Status  10/20/2019 29 (L) >39 mg/dL Final         Failed - Triglycerides in normal range and within 360 days    Triglycerides  Date Value Ref Range Status  10/20/2019 473 (H) 0 - 149 mg/dL Final   Triglycerides Piccolo,Waived  Date Value Ref Range Status  12/08/2017 308 (  H) <150 mg/dL Final    Comment:                            Normal                   <150                         Borderline High     150 - 199                         High                200 - 499                         Very High                >499          Passed - Total Cholesterol in normal range and within 360 days    Cholesterol, Total  Date Value Ref Range Status  10/20/2019 175 100 - 199 mg/dL Final   Cholesterol Piccolo, Waived  Date Value Ref Range Status  12/08/2017 156 <200 mg/dL Final    Comment:                            Desirable                <200                         Borderline High      200- 239                         High                     >239          Passed - Patient is not pregnant      Passed - Valid encounter within last 12 months    Recent Outpatient Visits          2 months ago Type 2 diabetes mellitus with other neurologic complication, without long-term current use of insulin (HCC)   [facility] [place], Megan P, DO   5 months ago Type 2 diabetes mellitus with other neurologic complication, without long-term  current use of insulin (HCC)   [facility] [place], Megan P, DO   8 months ago Type 2 diabetes mellitus with other neurologic complication, without long-term current use of insulin (HCC)   [facility] [place], Megan P, DO   1 year ago Routine general medical examination at a health care facility   [facility], [place] P, DO   1 year ago Type 2 diabetes mellitus with other neurologic complication, without long-term current use of insulin (HCC)   [facility] [place], [place], DO      Future Appointments            In 6 days Laural Benes, Oralia Rud, DO [employer], [facility]   In 2 weeks Laural Benes, Megan P, DO [facility], [facility]           . losartan (COZAAR) 50 MG tablet [Pharmacy Med Name: LOSARTAN 50MG   TABLETS] 90 tablet 1    Sig: TAKE 1 TABLET(50 MG) BY MOUTH DAILY     Cardiovascular:  Angiotensin Receptor Blockers Passed - 04/14/2020  3:17 AM      Passed - Cr in normal range and within 180 days    Creatinine, Ser  Date Value Ref Range Status  10/20/2019 0.69 0.57 - 1.00 mg/dL Final         Passed - K in normal range and within 180 days    Potassium  Date Value Ref Range Status  10/20/2019 4.2 3.5 - 5.2 mmol/L Final         Passed - Patient is not pregnant      Passed - Last BP in normal range    BP Readings from Last 1 Encounters:  10/20/19 128/80         Passed - Valid encounter within last 6 months    Recent Outpatient Visits          2 months ago Type 2 diabetes mellitus with other neurologic complication, without long-term current use of insulin (HCC)   [facility] [place], Megan P, DO   5 months ago Type 2 diabetes mellitus with other neurologic complication, without long-term current use of insulin (HCC)   [facility] [place], Megan P, DO   8 months ago Type 2 diabetes mellitus with other neurologic complication, without long-term current use of insulin (HCC)   [facility] [place], Megan P, DO   1 year ago Routine general medical examination at a health care facility   [facility], [place] P, DO   1 year ago Type 2 diabetes mellitus with other neurologic complication, without long-term current use of insulin [facility])   [facility] [place], [place], DO      Future Appointments            In 6 days Laural Benes, Oralia Rud, DO [employer], [facility]   In 2 weeks Laural Benes, Oralia Rud, DO [employer], [facility]

## 2020-04-16 ENCOUNTER — Other Ambulatory Visit: Payer: Self-pay | Admitting: Family Medicine

## 2020-04-16 NOTE — Telephone Encounter (Signed)
Requested Prescriptions  Pending Prescriptions Disp Refills  . metFORMIN (GLUCOPHAGE) 500 MG tablet [Pharmacy Med Name: METFORMIN $RemoveBeforeD'500MG'[id number]$  TABLETS] 360 tablet 0    Sig: TAKE 2 TABLETS(1000 MG) BY MOUTH TWICE DAILY WITH A MEAL     Endocrinology:  Diabetes - Biguanides Passed - 04/16/2020  1:30 PM      Passed - Cr in normal range and within 360 days    Creatinine, Ser  Date Value Ref Range Status  10/20/2019 0.69 0.57 - 1.00 mg/dL Final         Passed - HBA1C is between 0 and 7.9 and within 180 days    Hemoglobin A1C  Date Value Ref Range Status  11/15/2015 7.4  Final   HB A1C (BAYER DCA - WAIVED)  Date Value Ref Range Status  10/20/2019 6.6 <7.0 % Final    Comment:                                          Diabetic Adult            <7.0                                       Healthy Adult        4.3 - 5.7                                                           (DCCT/NGSP) American Diabetes Association's Summary of Glycemic Recommendations for Adults with Diabetes: Hemoglobin A1c <7.0%. More stringent glycemic goals (A1c <6.0%) may further reduce complications at the cost of increased risk of hypoglycemia.          Passed - eGFR in normal range and within 360 days    GFR calc Af Amer  Date Value Ref Range Status  10/20/2019 113 >59 mL/min/1.73 Final    Comment:    **In accordance with recommendations from the NKF-ASN Task force,**   [facility] is in the process of updating its eGFR calculation to the   2021 CKD-EPI creatinine equation that estimates kidney function   without a race variable.    GFR calc non Af Amer  Date Value Ref Range Status  10/20/2019 98 >59 mL/min/1.73 Final         Passed - Valid encounter within last 6 months    Recent Outpatient Visits          2 months ago Type 2 diabetes mellitus with other neurologic complication, without long-term current use of insulin ([place])   [place], Megan P, DO   5 months ago Type 2 diabetes  mellitus with other neurologic complication, without long-term current use of insulin ([place])   [place], Megan P, DO   9 months ago Type 2 diabetes mellitus with other neurologic complication, without long-term current use of insulin ([place])   [place], Megan P, DO   1 year ago Routine general medical examination at a health care facility   [facility], [place] P, DO   1 year ago Type 2 diabetes mellitus with other neurologic complication,  without long-term current use of insulin [facility])   [place], [place], DO      Future Appointments            In 4 days Wynetta Emery, Barb Merino, DO [employer], [place]   In 2 weeks Wynetta Emery, Barb Merino, DO [employer], [facility]

## 2020-04-20 ENCOUNTER — Encounter: Payer: Self-pay | Admitting: Family Medicine

## 2020-04-20 ENCOUNTER — Ambulatory Visit: Payer: 59 | Admitting: Nurse Practitioner

## 2020-04-20 ENCOUNTER — Other Ambulatory Visit: Payer: Self-pay

## 2020-04-20 VITALS — BP 125/78 | HR 77 | Temp 97.8°F | Ht 64.0 in | Wt 184.4 lb

## 2020-04-20 DIAGNOSIS — Z1231 Encounter for screening mammogram for malignant neoplasm of breast: Secondary | ICD-10-CM

## 2020-04-20 DIAGNOSIS — E1141 Type 2 diabetes mellitus with diabetic mononeuropathy: Secondary | ICD-10-CM | POA: Diagnosis not present

## 2020-04-20 DIAGNOSIS — E1169 Type 2 diabetes mellitus with other specified complication: Secondary | ICD-10-CM | POA: Diagnosis not present

## 2020-04-20 DIAGNOSIS — I10 Essential (primary) hypertension: Secondary | ICD-10-CM

## 2020-04-20 DIAGNOSIS — Z0000 Encounter for general adult medical examination without abnormal findings: Secondary | ICD-10-CM | POA: Diagnosis not present

## 2020-04-20 DIAGNOSIS — E785 Hyperlipidemia, unspecified: Secondary | ICD-10-CM

## 2020-04-20 DIAGNOSIS — E1149 Type 2 diabetes mellitus with other diabetic neurological complication: Secondary | ICD-10-CM

## 2020-04-20 LAB — URINALYSIS, ROUTINE W REFLEX MICROSCOPIC
Bilirubin, UA: NEGATIVE
Ketones, UA: NEGATIVE
Nitrite, UA: NEGATIVE
RBC, UA: NEGATIVE
Specific Gravity, UA: 1.025 (ref 1.005–1.030)
Urobilinogen, Ur: 0.2 mg/dL (ref 0.2–1.0)
pH, UA: 5.5 (ref 5.0–7.5)

## 2020-04-20 LAB — MICROSCOPIC EXAMINATION: RBC, Urine: NONE SEEN /hpf (ref 0–2)

## 2020-04-20 LAB — MICROALBUMIN, URINE WAIVED
Creatinine, Urine Waived: 200 mg/dL (ref 10–300)
Microalb, Ur Waived: 80 mg/L — ABNORMAL HIGH (ref 0–19)

## 2020-04-20 LAB — BAYER DCA HB A1C WAIVED: HB A1C (BAYER DCA - WAIVED): 9.6 % — ABNORMAL HIGH (ref <7.0)

## 2020-04-20 MED ORDER — LOSARTAN POTASSIUM 50 MG PO TABS: 50.0000 mg | ORAL_TABLET | Freq: Every day | ORAL | 1 refills | Status: DC

## 2020-04-20 MED ORDER — PRAVASTATIN SODIUM 20 MG PO TABS: 20.0000 mg | ORAL_TABLET | Freq: Every day | ORAL | 1 refills | Status: DC

## 2020-04-20 MED ORDER — LEVOCETIRIZINE DIHYDROCHLORIDE 5 MG PO TABS: 5.0000 mg | ORAL_TABLET | Freq: Every evening | ORAL | 4 refills | Status: DC

## 2020-04-20 MED ORDER — ASPIRIN EC 81 MG PO TBEC: 81.0000 mg | DELAYED_RELEASE_TABLET | Freq: Every day | ORAL | 3 refills | Status: DC

## 2020-04-20 MED ORDER — DULOXETINE HCL 20 MG PO CPEP: ORAL_CAPSULE | ORAL | 1 refills | Status: DC

## 2020-04-20 MED ORDER — HYDROCHLOROTHIAZIDE 25 MG PO TABS: ORAL_TABLET | ORAL | 1 refills | Status: DC

## 2020-04-20 MED ORDER — TRULICITY 3 MG/0.5ML ~~LOC~~ SOAJ: 3.0000 mg | SUBCUTANEOUS | 1 refills | Status: DC

## 2020-04-20 MED ORDER — METFORMIN HCL 500 MG PO TABS: 1000.0000 mg | ORAL_TABLET | Freq: Two times a day (BID) | ORAL | 1 refills | Status: DC

## 2020-04-20 NOTE — Assessment & Plan Note (Signed)
Under good control on current regimen. Continue current regimen. Continue to monitor. Call with any concerns. Refills given. Labs drawn today.   

## 2020-04-20 NOTE — Progress Notes (Signed)
BP 125/78   Pulse 77   Temp 97.8 F (36.6 C)   Ht 5\' 4"  (1.626 m)   Wt 184 lb 6.4 oz (83.6 kg)   SpO2 97%   BMI 31.65 kg/m    Subjective:    Patient ID: , female    DOB: 12/12/1965, 55 y.o.   MRN: [id number]  HPI: Lori Horne is a 55 y.o. female presenting on 04/20/2020 for comprehensive medical examination. Current medical complaints include:  DIABETES- was under a lot of stress with her husband being in the hospital, was not eating what she should have been Hypoglycemic episodes:no Polydipsia/polyuria: no Visual disturbance: no Chest pain: no Paresthesias: no Glucose Monitoring: no  Accucheck frequency: Not Checking Taking Insulin?: no Blood Pressure Monitoring: not checking Retinal Examination: Not up to Date Foot Exam: Done today Diabetic Education: Completed Pneumovax: Up to Date Influenza: Declined Aspirin: yes  HYPERTENSION / HYPERLIPIDEMIA Satisfied with current treatment? yes Duration of hypertension: chronic BP monitoring frequency: not checking BP medication side effects: no Past BP meds:  Duration of hyperlipidemia: chronic Cholesterol medication side effects: no Cholesterol supplements: none Past cholesterol medications: pravastatin Medication compliance: excellent compliance Aspirin: yes Recent stressors: no Recurrent headaches: no Visual changes: no Palpitations: no Dyspnea: no Chest pain: no Lower extremity edema: no Dizzy/lightheaded: no  She currently lives with: husband Menopausal Symptoms: no  Depression Screen done today and results listed below:  Depression screen [facility] 2/9 04/20/2020 04/15/2019 12/22/2018 09/21/2018 03/10/2018  Decreased Interest 0 0 0 0 0  Down, Depressed, Hopeless 0 0 0 0 0  PHQ - 2 Score 0 0 0 0 0  Altered sleeping - 0 2 0 0  Tired, decreased energy - 0 0 0 1  Change in appetite - 0 1 0 0  Feeling bad or failure about yourself  - 0 0 0 0  Trouble concentrating - 0 0 0 0  Moving slowly or  fidgety/restless - 0 0 0 0  Suicidal thoughts - 0 0 0 0  PHQ-9 Score - 0 3 0 1  Difficult doing work/chores - Not difficult at all Not difficult at all Not difficult at all Not difficult at all    Past Medical History:  Past Medical History:  Diagnosis Date  . Cataract   . Diabetes mellitus without complication (HCC)   . Hypertension   . Neuromuscular disorder [facility])     Surgical History:  Past Surgical History:  Procedure Laterality Date  . catarac surgery Left 01/07/2013  . CHOLECYSTECTOMY    . EYE SURGERY Right 06/2017   Cataract    Medications:  Current Outpatient Medications on File Prior to Visit  Medication Sig  . glucose blood (CONTOUR NEXT TEST) test strip CHECK BLOOD SUGAR DAILY  . hydrocortisone (ANUSOL-HC) 2.5 % rectal cream Place 1 application rectally 2 (two) times daily.  . Multiple Vitamin (MULTI-VITAMINS) TABS Take by mouth.  . triamcinolone ointment (KENALOG) 0.5 % Apply 1 application 2 (two) times daily topically.   No current facility-administered medications on file prior to visit.    Allergies:  No Known Allergies  Social History:  Social History   Socioeconomic History  . Marital status: Married    Spouse name: Not on file  . Number of children: Not on file  . Years of education: Not on file  . Highest education level: Not on file  Occupational History  . Not on file  Tobacco Use  . Smoking status: Never Smoker  . Smokeless tobacco: Never  Used  Vaping Use  . Vaping Use: Never used  Substance and Sexual Activity  . Alcohol use: Yes    Comment: Occasional glass of wine  . Drug use: No  . Sexual activity: Yes  Other Topics Concern  . Not on file  Social History Narrative  . Not on file   Social Determinants of Health   Financial Resource Strain: Not on file  Food Insecurity: Not on file  Transportation Needs: Not on file  Physical Activity: Not on file  Stress: Not on file  Social Connections: Not on file  Intimate Partner  Violence: Not on file   Social History   Tobacco Use  Smoking Status Never Smoker  Smokeless Tobacco Never Used   Social History   Substance and Sexual Activity  Alcohol Use Yes   Comment: Occasional glass of wine    Family History:  Family History  Adopted: Yes    Past medical history, surgical history, medications, allergies, family history and social history reviewed with patient today and changes made to appropriate areas of the chart.   Review of Systems  Constitutional: Negative.   HENT: Negative.   Eyes: Negative.   Respiratory: Negative.   Cardiovascular: Negative.   Gastrointestinal: Positive for heartburn (with food choices). Negative for abdominal pain, blood in stool, constipation, diarrhea, melena, nausea and vomiting.  Genitourinary: Negative.   Musculoskeletal: Negative.   Skin: Negative.   Neurological: Negative.   Endo/Heme/Allergies: Positive for environmental allergies. Negative for polydipsia. Does not bruise/bleed easily.  Psychiatric/Behavioral: Negative.    All other ROS negative except what is listed above and in the HPI.      Objective:    BP 125/78   Pulse 77   Temp 97.8 F (36.6 C)   Ht 5\' 4"  (1.626 m)   Wt 184 lb 6.4 oz (83.6 kg)   SpO2 97%   BMI 31.65 kg/m   Wt Readings from Last 3 Encounters:  04/20/20 184 lb 6.4 oz (83.6 kg)  01/21/20 183 lb (83 kg)  10/20/19 186 lb (84.4 kg)    Physical Exam Vitals and nursing note reviewed.  Constitutional:      General: She is not in acute distress.    Appearance: Normal appearance. She is obese. She is not ill-appearing, toxic-appearing or diaphoretic.  HENT:     Head: Normocephalic and atraumatic.     Right Ear: Tympanic membrane, ear canal and external ear normal. There is no impacted cerumen.     Left Ear: Tympanic membrane, ear canal and external ear normal. There is no impacted cerumen.     Nose: Nose normal. No congestion or rhinorrhea.     Mouth/Throat:     Mouth: Mucous  membranes are moist.     Pharynx: Oropharynx is clear. No oropharyngeal exudate or posterior oropharyngeal erythema.  Eyes:     General: No scleral icterus.       Right eye: No discharge.        Left eye: No discharge.     Extraocular Movements: Extraocular movements intact.     Conjunctiva/sclera: Conjunctivae normal.     Pupils: Pupils are equal, round, and reactive to light.  Neck:     Vascular: No carotid bruit.  Cardiovascular:     Rate and Rhythm: Normal rate and regular rhythm.     Pulses: Normal pulses.     Heart sounds: No murmur heard. No friction rub. No gallop.   Pulmonary:     Effort: Pulmonary effort is normal.  No respiratory distress.     Breath sounds: Normal breath sounds. No stridor. No wheezing, rhonchi or rales.  Chest:     Chest wall: No tenderness.  Abdominal:     General: Abdomen is flat. Bowel sounds are normal. There is no distension.     Palpations: Abdomen is soft. There is no mass.     Tenderness: There is no abdominal tenderness. There is no right CVA tenderness, left CVA tenderness, guarding or rebound.     Hernia: No hernia is present.  Genitourinary:    Comments: Breast and pelvic exams deferred with shared decision making Musculoskeletal:        General: No swelling, tenderness, deformity or signs of injury.     Cervical back: Normal range of motion and neck supple. No rigidity. No muscular tenderness.     Right lower leg: No edema.     Left lower leg: No edema.  Lymphadenopathy:     Cervical: No cervical adenopathy.  Skin:    General: Skin is warm and dry.     Capillary Refill: Capillary refill takes less than 2 seconds.     Coloration: Skin is not jaundiced or pale.     Findings: No bruising, erythema, lesion or rash.  Neurological:     General: No focal deficit present.     Mental Status: She is alert and oriented to person, place, and time. Mental status is at baseline.     Cranial Nerves: No cranial nerve deficit.     Sensory: No  sensory deficit.     Motor: No weakness.     Coordination: Coordination normal.     Gait: Gait normal.     Deep Tendon Reflexes: Reflexes normal.  Psychiatric:        Mood and Affect: Mood normal.        Behavior: Behavior normal.        Thought Content: Thought content normal.        Judgment: Judgment normal.     Results for orders placed or performed in visit on 10/21/19  HM DIABETES EYE EXAM  Result Value Ref Range   HM Diabetic Eye Exam No Retinopathy No Retinopathy      Assessment & Plan:   Problem List Items Addressed This Visit      Cardiovascular and Mediastinum   Hypertension    Under good control on current regimen. Continue current regimen. Continue to monitor. Call with any concerns. Refills given. Labs drawn today.        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 Continue current regimen. Continue to monitor. Call with any concerns. Refills given. Labs drawn today.       Relevant Medications   pravastatin (PRAVACHOL) 20 MG tablet   metFORMIN (GLUCOPHAGE) 500 MG tablet   losartan (COZAAR) 50 MG tablet   aspirin EC 81 MG tablet   Dulaglutide (TRULICITY) 3 MG/0.5ML SOPN   Type 2 diabetes mellitus (HCC)    Not under good control on current regimen with a1c of 9.3. We will really work on diet and increase her trulicity to 3mg . Recheck  3 months. Call with any concerns.      Relevant Medications   pravastatin (PRAVACHOL) 20 MG tablet   metFORMIN (GLUCOPHAGE) 500 MG tablet   losartan (COZAAR) 50 MG tablet   aspirin EC 81 MG tablet   Dulaglutide (TRULICITY) 3 MG/0.5ML SOPN   Other Relevant Orders   Bayer DCA  Hb A1c Waived   Microalbumin, Urine Waived   CBC with Differential/Platelet   Comprehensive metabolic panel   Urinalysis, Routine w reflex microscopic   Hyperlipidemia associated with type 2 diabetes mellitus (HCC)    Under good control on current regimen. Continue current regimen. Continue to monitor. Call with any concerns. Refills given. Labs drawn today.       Relevant Medications   pravastatin (PRAVACHOL) 20 MG tablet   metFORMIN (GLUCOPHAGE) 500 MG tablet   losartan (COZAAR) 50 MG tablet   aspirin EC 81 MG tablet   Dulaglutide (TRULICITY) 3 MG/0.5ML SOPN   Other Relevant Orders   CBC with Differential/Platelet   Comprehensive metabolic panel   Lipid Panel w/o Chol/HDL Ratio    Other Visit Diagnoses    Routine general medical examination at a health care facility    -  Primary   Vaccines up to date/declined. Screening labs checked today. Pap, mammo and colonoscopy up to date. Continue diet and exercise. Call with any concerns.   Relevant Orders   Bayer DCA Hb A1c Waived   Microalbumin, Urine Waived   CBC with Differential/Platelet   Comprehensive metabolic panel   Lipid Panel w/o Chol/HDL Ratio   Urinalysis, Routine w reflex microscopic   TSH   Screening mammogram for breast cancer       Mammogram ordered today.   Relevant Orders   MM 3D SCREEN BREAST BILATERAL       Follow up plan: Return in about 3 months (around 07/20/2020).   LABORATORY TESTING:  - Pap smear: up to date  IMMUNIZATIONS:   - Tdap: Tetanus vaccination status reviewed: last tetanus booster within 10 years. - Influenza: Postponed to flu season - Pneumovax: Up to date - COVID: Refused  SCREENING: -Mammogram: Ordered today  - Colonoscopy: Up to date   PATIENT COUNSELING:   Advised to take 1 mg of folate supplement per day if capable of pregnancy.   Sexuality: Discussed sexually transmitted diseases, partner selection, use of condoms, avoidance of unintended pregnancy  and contraceptive  alternatives.   Advised to avoid cigarette smoking.  I discussed with the patient that most people either abstain from alcohol or drink within safe limits (<=14/week and <=4 drinks/occasion for males, <=7/weeks and <= 3 drinks/occasion for females) and that the risk for alcohol disorders and other health effects rises proportionally with the number of drinks per week and how often a drinker exceeds daily limits.  Discussed cessation/primary prevention of drug use and availability of treatment for abuse.   Diet: Encouraged to adjust caloric intake to maintain  or achieve ideal body weight, to reduce intake of dietary saturated fat and total fat, to limit sodium intake by avoiding high sodium foods and not adding table salt, and to maintain adequate dietary potassium and calcium preferably from fresh fruits, vegetables, and low-fat dairy products.    stressed the importance of regular exercise  Injury prevention: Discussed safety belts, safety helmets, smoke detector, smoking near bedding or upholstery.   Dental health: Discussed importance of regular tooth brushing, flossing, and dental visits.    NEXT PREVENTATIVE PHYSICAL DUE IN 1 YEAR. Return in about 3 months (around  07/20/2020).           

## 2020-04-20 NOTE — Patient Instructions (Addendum)
Voltaren Gel- for the arthritis  Call to schedule your mammogram:  [facility]  Address: [address] [place], [place], [place] [address]  Phone: [phone]   Health Maintenance, Female Adopting a healthy lifestyle and getting preventive care are important in promoting health and wellness. Ask your health care provider about:  The right schedule for you to have regular tests and exams.  Things you can do on your own to prevent diseases and keep yourself healthy. What should I know about diet, weight, and exercise? Eat a healthy diet  Eat a diet that includes plenty of vegetables, fruits, low-fat dairy products, and lean protein.  Do not eat a lot of foods that are high in solid fats, added sugars, or sodium.   Maintain a healthy weight Body mass index (BMI) is used to identify weight problems. It estimates body fat based on height and weight. Your health care provider can help determine your BMI and help you achieve or maintain a healthy weight. Get regular exercise Get regular exercise. This is one of the most important things you can do for your health. Most adults should:  Exercise for at least 150 minutes each week. The exercise should increase your heart rate and make you sweat (moderate-intensity exercise).  Do strengthening exercises at least twice a week. This is in addition to the moderate-intensity exercise.  Spend less time sitting. Even light physical activity can be beneficial. Watch cholesterol and blood lipids Have your blood tested for lipids and cholesterol at 57 years of age, then have this test every 5 years. Have your cholesterol levels checked more often if:  Your lipid or cholesterol levels are high.  You are older than 57 years of age.  You are at high risk for heart disease. What should I know about cancer screening? Depending on your health history and family history, you may need to have cancer screening at various  ages. This may include screening for:  Breast cancer.  Cervical cancer.  Colorectal cancer.  Skin cancer.  Lung cancer. What should I know about heart disease, diabetes, and high blood pressure? Blood pressure and heart disease  High blood pressure causes heart disease and increases the risk of stroke. This is more likely to develop in people who have high blood pressure readings, are of African descent, or are overweight.  Have your blood pressure checked: ? Every 3-5 years if you are 52-51 years of age. ? Every year if you are 37 years old or older. Diabetes Have regular diabetes screenings. This checks your fasting blood sugar level. Have the screening done:  Once every three years after age 79 if you are at a normal weight and have a low risk for diabetes.  More often and at a younger age if you are overweight or have a high risk for diabetes. What should I know about preventing infection? Hepatitis B If you have a higher risk for hepatitis B, you should be screened for this virus. Talk with your health care provider to find out if you are at risk for hepatitis B infection. Hepatitis C Testing is recommended for:  Everyone born from [birth year] through 1967.  Anyone with known risk factors for hepatitis C. Sexually transmitted infections (STIs)  Get screened for STIs, including gonorrhea and chlamydia, if: ? You are sexually active and are younger than 57 years of age. ? You are older than 57 years of age and your health care provider tells you that you  are at risk for this type of infection. ? Your sexual activity has changed since you were last screened, and you are at increased risk for chlamydia or gonorrhea. Ask your health care provider if you are at risk.  Ask your health care provider about whether you are at high risk for HIV. Your health care provider may recommend a prescription medicine to help prevent HIV infection. If you choose to take medicine to prevent HIV, you  should first get tested for HIV. You should then be tested every 3 months for as long as you are taking the medicine. Pregnancy  If you are about to stop having your period (premenopausal) and you may become pregnant, seek counseling before you get pregnant.  Take 400 to 800 micrograms (mcg) of folic acid every day if you become pregnant.  Ask for birth control (contraception) if you want to prevent pregnancy. Osteoporosis and menopause Osteoporosis is a disease in which the bones lose minerals and strength with aging. This can result in bone fractures. If you are 65 years old or older, or if you are at risk for osteoporosis and fractures, ask your health care provider if you should:  Be screened for bone loss.  Take a calcium or vitamin D supplement to lower your risk of fractures.  Be given hormone replacement therapy (HRT) to treat symptoms of menopause. Follow these instructions at home: Lifestyle  Do not use any products that contain nicotine or tobacco, such as cigarettes, e-cigarettes, and chewing tobacco. If you need help quitting, ask your health care provider.  Do not use street drugs.  Do not share needles.  Ask your health care provider for help if you need support or information about quitting drugs. Alcohol use  Do not drink alcohol if: ? Your health care provider tells you not to drink. ? You are pregnant, may be pregnant, or are planning to become pregnant.  If you drink alcohol: ? Limit how much you use to 0-1 drink a day. ? Limit intake if you are breastfeeding.  Be aware of how much alcohol is in your drink. In the [place] one drink equals one 12 oz bottle of beer (355 mL), one 5 oz glass of wine (148 mL), or one 1 oz glass of hard liquor (44 mL). General instructions  Schedule regular health, dental, and eye exams.  Stay current with your vaccines.  Tell your health care provider if: ? You often feel depressed. ? You have ever been abused or do not feel  safe at home. Summary  Adopting a healthy lifestyle and getting preventive care are important in promoting health and wellness.  Follow your health care provider's instructions about healthy diet, exercising, and getting tested or screened for diseases.  Follow your health care provider's instructions on monitoring your cholesterol and blood pressure. This information is not intended to replace advice given to you by your health care provider. Make sure you discuss any questions you have with your health care provider. Document Revised: 12/17/2017 Document Reviewed: 12/17/2017 Elsevier Patient Education  2021 [employer].

## 2020-04-20 NOTE — Assessment & Plan Note (Addendum)
Not under good control on current regimen with a1c of 9.3. We will really work on diet and increase her trulicity to 3mg . Recheck 3 months. Call with any concerns.

## 2020-04-21 LAB — COMPREHENSIVE METABOLIC PANEL
ALT: 40 IU/L — ABNORMAL HIGH (ref 0–32)
AST: 35 IU/L (ref 0–40)
Albumin/Globulin Ratio: 1.4 (ref 1.2–2.2)
Albumin: 4.3 g/dL (ref 3.8–4.9)
Alkaline Phosphatase: 81 IU/L (ref 44–121)
BUN/Creatinine Ratio: 14 (ref 9–23)
BUN: 12 mg/dL (ref 6–24)
Bilirubin Total: 0.3 mg/dL (ref 0.0–1.2)
CO2: 23 mmol/L (ref 20–29)
Calcium: 10 mg/dL (ref 8.7–10.2)
Chloride: 93 mmol/L — ABNORMAL LOW (ref 96–106)
Creatinine, Ser: 0.84 mg/dL (ref 0.57–1.00)
Globulin, Total: 3.1 g/dL (ref 1.5–4.5)
Glucose: 253 mg/dL — ABNORMAL HIGH (ref 65–99)
Potassium: 4.1 mmol/L (ref 3.5–5.2)
Sodium: 136 mmol/L (ref 134–144)
Total Protein: 7.4 g/dL (ref 6.0–8.5)
eGFR: 81 mL/min/{1.73_m2} (ref >59)

## 2020-04-21 LAB — CBC WITH DIFFERENTIAL/PLATELET
Basophils Absolute: 0.1 10*3/uL (ref 0.0–0.2)
Basos: 1 %
EOS (ABSOLUTE): 0.6 10*3/uL — ABNORMAL HIGH (ref 0.0–0.4)
Eos: 9 %
Hematocrit: 42 % (ref 34.0–46.6)
Hemoglobin: 14.3 g/dL (ref 11.1–15.9)
Immature Grans (Abs): 0 10*3/uL (ref 0.0–0.1)
Immature Granulocytes: 0 %
Lymphocytes Absolute: 1.9 10*3/uL (ref 0.7–3.1)
Lymphs: 28 %
MCH: 31 pg (ref 26.6–33.0)
MCHC: 34 g/dL (ref 31.5–35.7)
MCV: 91 fL (ref 79–97)
Monocytes Absolute: 0.6 10*3/uL (ref 0.1–0.9)
Monocytes: 9 %
Neutrophils Absolute: 3.5 10*3/uL (ref 1.4–7.0)
Neutrophils: 53 %
Platelets: 299 10*3/uL (ref 150–450)
RBC: 4.61 x10E6/uL (ref 3.77–5.28)
RDW: 12.4 % (ref 11.7–15.4)
WBC: 6.6 10*3/uL (ref 3.4–10.8)

## 2020-04-21 LAB — LIPID PANEL W/O CHOL/HDL RATIO
Cholesterol, Total: 164 mg/dL (ref 100–199)
HDL: 32 mg/dL — ABNORMAL LOW (ref >39)
LDL Chol Calc (NIH): 73 mg/dL (ref 0–99)
Triglycerides: 371 mg/dL — ABNORMAL HIGH (ref 0–149)
VLDL Cholesterol Cal: 59 mg/dL — ABNORMAL HIGH (ref 5–40)

## 2020-04-21 LAB — TSH: TSH: 2.41 u[IU]/mL (ref 0.450–4.500)

## 2020-05-01 ENCOUNTER — Encounter: Payer: Self-pay | Admitting: Family Medicine

## 2020-05-01 ENCOUNTER — Telehealth: Payer: Self-pay

## 2020-05-01 NOTE — Telephone Encounter (Signed)
Please let the patient know that the provider billed for a CPE for DOS 04/20/20.  The appointment type scheduled has no bearing on the claim that will be filed. If patient asks additional questions you can let her know she was billed for a cpe and also an ov for her chronic conditions.  Let me know if you have any questions. Thanks

## 2020-05-01 NOTE — Telephone Encounter (Signed)
Pt stated visit she had on 04/20/2020 was suppose to be coded as a physical she stated provider did a physical, pt needs this apt changed to say physical instead of office visit for her job/ins purpos. Please advise.

## 2020-05-02 NOTE — Telephone Encounter (Signed)
Pt verbalized understanding.

## 2020-05-03 ENCOUNTER — Encounter: Payer: 59 | Admitting: Family Medicine

## 2020-06-16 ENCOUNTER — Other Ambulatory Visit: Payer: Self-pay | Admitting: Family Medicine

## 2020-06-19 ENCOUNTER — Encounter: Payer: Self-pay | Admitting: Family Medicine

## 2020-07-04 ENCOUNTER — Other Ambulatory Visit: Payer: Self-pay | Admitting: Family Medicine

## 2020-07-20 ENCOUNTER — Ambulatory Visit: Payer: 59 | Admitting: Family Medicine

## 2020-07-27 ENCOUNTER — Ambulatory Visit: Payer: 59 | Admitting: Family Medicine

## 2020-07-27 ENCOUNTER — Telehealth: Payer: Self-pay

## 2020-07-27 ENCOUNTER — Other Ambulatory Visit: Payer: Self-pay

## 2020-07-27 ENCOUNTER — Encounter: Payer: Self-pay | Admitting: Family Medicine

## 2020-07-27 VITALS — BP 136/84 | HR 74 | Temp 97.8°F | Ht 64.0 in | Wt 183.2 lb

## 2020-07-27 DIAGNOSIS — Z1152 Encounter for screening for COVID-19: Secondary | ICD-10-CM | POA: Diagnosis not present

## 2020-07-27 DIAGNOSIS — E1149 Type 2 diabetes mellitus with other diabetic neurological complication: Secondary | ICD-10-CM

## 2020-07-27 LAB — BAYER DCA HB A1C WAIVED: HB A1C (BAYER DCA - WAIVED): 10 % — ABNORMAL HIGH (ref <7.0)

## 2020-07-27 MED ORDER — OZEMPIC (0.25 OR 0.5 MG/DOSE) 2 MG/1.5ML ~~LOC~~ SOPN: PEN_INJECTOR | SUBCUTANEOUS | 1 refills | Status: AC

## 2020-07-27 MED ORDER — PEN NEEDLES 30G X 5 MM MISC: 1.0000 | 12 refills | Status: DC

## 2020-07-27 NOTE — Progress Notes (Signed)
BP 136/84   Pulse 74   Temp 97.8 F (36.6 C) (Oral)   Ht _0  (1.626 m)   Wt 183 lb 3.2 oz (83.1 kg)   SpO2 99%   BMI 31.45 kg/m    Subjective:    Patient ID: Lori Horne, female    DOB: 01-23-1965, 55 y.o.   MRN: [id number]  HPI: Lori Horne is a 55 y.o. female  Chief Complaint  Patient presents with   Diabetes   DIABETES Hypoglycemic episodes:no Polydipsia/polyuria: yes Visual disturbance: no Chest pain: no Paresthesias: no Glucose Monitoring: yes  Accucheck frequency: occasionally Taking Insulin?: no Blood Pressure Monitoring: not checking Retinal Examination: Up to Date Foot Exam: Up to Date Diabetic Education: Completed Pneumovax: Up to Date Influenza: Up to Date Aspirin: no  Relevant past medical, surgical, family and social history reviewed and updated as indicated. Interim medical history since our last visit reviewed. Allergies and medications reviewed and updated.  Review of Systems  Constitutional: Negative.   Respiratory: Negative.    Cardiovascular: Negative.   Gastrointestinal: Negative.   Musculoskeletal: Negative.   Psychiatric/Behavioral: Negative.     Per HPI unless specifically indicated above     Objective:    BP 136/84   Pulse 74   Temp 97.8 F (36.6 C) (Oral)   Ht _1  (1.626 m)   Wt 183 lb 3.2 oz (83.1 kg)   SpO2 99%   BMI 31.45 kg/m   Wt Readings from Last 3 Encounters:  07/27/20 183 lb 3.2 oz (83.1 kg)  04/20/20 184 lb 6.4 oz (83.6 kg)  01/21/20 183 lb (83 kg)    Physical Exam Vitals and nursing note reviewed.  Constitutional:      General: She is not in acute distress.    Appearance: Normal appearance. She is not ill-appearing, toxic-appearing or diaphoretic.  HENT:     Head: Normocephalic and atraumatic.     Right Ear: External ear normal.     Left Ear: External ear normal.     Nose: Nose normal.     Mouth/Throat:     Mouth: Mucous membranes are moist.     Pharynx: Oropharynx is clear.  Eyes:      General: No scleral icterus.       Right eye: No discharge.        Left eye: No discharge.     Extraocular Movements: Extraocular movements intact.     Conjunctiva/sclera: Conjunctivae normal.     Pupils: Pupils are equal, round, and reactive to light.  Cardiovascular:     Rate and Rhythm: Normal rate and regular rhythm.     Pulses: Normal pulses.     Heart sounds: Normal heart sounds. No murmur heard.   No friction rub. No gallop.  Pulmonary:     Effort: Pulmonary effort is normal. No respiratory distress.     Breath sounds: Normal breath sounds. No stridor. No wheezing, rhonchi or rales.  Chest:     Chest wall: No tenderness.  Musculoskeletal:        General: Normal range of motion.     Cervical back: Normal range of motion and neck supple.  Skin:    General: Skin is warm and dry.     Capillary Refill: Capillary refill takes less than 2 seconds.     Coloration: Skin is not jaundiced or pale.     Findings: No bruising, erythema, lesion or rash.  Neurological:     General: No focal deficit present.  Mental Status: She is alert and oriented to person, place, and time. Mental status is at baseline.  Psychiatric:        Mood and Affect: Mood normal.        Behavior: Behavior normal.        Thought Content: Thought content normal.        Judgment: Judgment normal.    Results for orders placed or performed in visit on 04/20/20  Microscopic Examination   Urine  Result Value Ref Range   WBC, UA 0-5 0 - 5 /hpf   RBC None seen 0 - 2 /hpf   Epithelial Cells (non renal) 0-10 0 - 10 /hpf   Mucus, UA Present (A) Not Estab.   Bacteria, UA Few (A) None seen/Few  Bayer DCA Hb A1c Waived  Result Value Ref Range   HB A1C (BAYER DCA - WAIVED) 9.6 (H) <7.0 %  Microalbumin, Urine Waived  Result Value Ref Range   Microalb, Ur Waived 80 (H) 0 - 19 mg/L   Creatinine, Urine Waived 200 10 - 300 mg/dL   Microalb/Creat Ratio 30-300 (H) <30 mg/g  CBC with Differential/Platelet  Result Value  Ref Range   WBC 6.6 3.4 - 10.8 x10E3/uL   RBC 4.61 3.77 - 5.28 x10E6/uL   Hemoglobin 14.3 11.1 - 15.9 g/dL   Hematocrit 42.0 34.0 - 46.6 %   MCV 91 79 - 97 fL   MCH 31.0 26.6 - 33.0 pg   MCHC 34.0 31.5 - 35.7 g/dL   RDW 12.4 11.7 - 15.4 %   Platelets 299 150 - 450 x10E3/uL   Neutrophils 53 Not Estab. %   Lymphs 28 Not Estab. %   Monocytes 9 Not Estab. %   Eos 9 Not Estab. %   Basos 1 Not Estab. %   Neutrophils Absolute 3.5 1.4 - 7.0 x10E3/uL   Lymphocytes Absolute 1.9 0.7 - 3.1 x10E3/uL   Monocytes Absolute 0.6 0.1 - 0.9 x10E3/uL   EOS (ABSOLUTE) 0.6 (H) 0.0 - 0.4 x10E3/uL   Basophils Absolute 0.1 0.0 - 0.2 x10E3/uL   Immature Granulocytes 0 Not Estab. %   Immature Grans (Abs) 0.0 0.0 - 0.1 x10E3/uL  Comprehensive metabolic panel  Result Value Ref Range   Glucose 253 (H) 65 - 99 mg/dL   BUN 12 6 - 24 mg/dL   Creatinine, Ser 0.84 0.57 - 1.00 mg/dL   eGFR 81 >59 mL/min/1.73   BUN/Creatinine Ratio 14 9 - 23   Sodium 136 134 - 144 mmol/L   Potassium 4.1 3.5 - 5.2 mmol/L   Chloride 93 (L) 96 - 106 mmol/L   CO2 23 20 - 29 mmol/L   Calcium 10.0 8.7 - 10.2 mg/dL   Total Protein 7.4 6.0 - 8.5 g/dL   Albumin 4.3 3.8 - 4.9 g/dL   Globulin, Total 3.1 1.5 - 4.5 g/dL   Albumin/Globulin Ratio 1.4 1.2 - 2.2   Bilirubin Total 0.3 0.0 - 1.2 mg/dL   Alkaline Phosphatase 81 44 - 121 IU/L   AST 35 0 - 40 IU/L   ALT 40 (H) 0 - 32 IU/L  Lipid Panel w/o Chol/HDL Ratio  Result Value Ref Range   Cholesterol, Total 164 100 - 199 mg/dL   Triglycerides 371 (H) 0 - 149 mg/dL   HDL 32 (L) >39 mg/dL   VLDL Cholesterol Cal 59 (H) 5 - 40 mg/dL   LDL Chol Calc (NIH) 73 0 - 99 mg/dL  Urinalysis, Routine w reflex microscopic  Result Value  Ref Range   Specific Gravity, UA 1.025 1.005 - 1.030   pH, UA 5.5 5.0 - 7.5   Color, UA Yellow Yellow   Appearance Ur Clear Clear   Leukocytes,UA Trace (A) Negative   Protein,UA 1+ (A) Negative/Trace   Glucose, UA 1+ (A) Negative   Ketones, UA Negative Negative    RBC, UA Negative Negative   Bilirubin, UA Negative Negative   Urobilinogen, Ur 0.2 0.2 - 1.0 mg/dL   Nitrite, UA Negative Negative   Microscopic Examination See below:   TSH  Result Value Ref Range   TSH 2.410 0.450 - 4.500 uIU/mL      Assessment & Plan:   Problem List Items Addressed This Visit       Endocrine   Type 2 diabetes mellitus ([place]) - Primary    Not under good control with a1c of 10.0- will change from trulicity to ozempic and recheck 3 months. Call with any concerns.        Relevant Medications   Semaglutide,0.25 or 0.5MG/DOS, (OZEMPIC, 0.25 OR 0.5 MG/DOSE,) 2 MG/1.5ML SOPN   Other Relevant Orders   Bayer DCA Hb A1c Waived   Other Visit Diagnoses     Encounter for screening for COVID-19       Labs drawn today. Await results.    Relevant Orders   SARS-CoV-2 Semi-Quantitative Total Antibody, Spike        Follow up plan: Return in about 3 months (around 10/27/2020).

## 2020-07-27 NOTE — Assessment & Plan Note (Signed)
Not under good control with a1c of 10.0- will change from trulicity to ozempic and recheck 3 months. Call with any concerns.

## 2020-07-27 NOTE — Telephone Encounter (Signed)
PA initiated via CoverMyMeds for Ozempic Key: [id number]  Waiting on response

## 2020-07-27 NOTE — Telephone Encounter (Signed)
PA for Ozempic 2MG / 1.5ML has been approved via CoverMyMeds. Approved through 07/27/2021

## 2020-07-28 LAB — SARS-COV-2 SEMI-QUANTITATIVE TOTAL ANTIBODY, SPIKE
SARS-CoV-2 Semi-Quant Total Ab: <0.8 U/mL (ref <0.8)
SARS-CoV-2 Spike Ab Interp: NEGATIVE

## 2020-08-22 LAB — HM DIABETES EYE EXAM

## 2020-09-28 ENCOUNTER — Other Ambulatory Visit: Payer: Self-pay | Admitting: Family Medicine

## 2020-09-29 LAB — HM MAMMOGRAPHY

## 2020-10-03 ENCOUNTER — Telehealth: Payer: Self-pay

## 2020-10-03 NOTE — Telephone Encounter (Signed)
PA for Ozempic initiated and submitted via Cover My Meds. Key: [id number]

## 2020-10-05 NOTE — Telephone Encounter (Signed)
PA cancelled due to duplicate request. Medication already approved from 07/27/20 - 07/28/21

## 2020-10-08 ENCOUNTER — Other Ambulatory Visit: Payer: Self-pay | Admitting: Family Medicine

## 2020-10-08 NOTE — Telephone Encounter (Signed)
dc'd 07/27/20 Spoke with Miya at [employer] and med is not on pt's profile- advised NT to disregard

## 2020-10-19 ENCOUNTER — Encounter: Payer: Self-pay | Admitting: Family Medicine

## 2020-10-23 ENCOUNTER — Ambulatory Visit: Payer: 59 | Admitting: Family Medicine

## 2020-10-23 ENCOUNTER — Encounter: Payer: Self-pay | Admitting: Family Medicine

## 2020-10-23 ENCOUNTER — Other Ambulatory Visit: Payer: Self-pay

## 2020-10-23 VITALS — BP 130/80 | HR 66 | Ht 64.0 in | Wt 184.0 lb

## 2020-10-23 DIAGNOSIS — F4321 Adjustment disorder with depressed mood: Secondary | ICD-10-CM

## 2020-10-23 DIAGNOSIS — R102 Pelvic and perineal pain: Secondary | ICD-10-CM | POA: Diagnosis not present

## 2020-10-23 DIAGNOSIS — E1169 Type 2 diabetes mellitus with other specified complication: Secondary | ICD-10-CM | POA: Diagnosis not present

## 2020-10-23 DIAGNOSIS — I10 Essential (primary) hypertension: Secondary | ICD-10-CM | POA: Diagnosis not present

## 2020-10-23 DIAGNOSIS — E785 Hyperlipidemia, unspecified: Secondary | ICD-10-CM

## 2020-10-23 DIAGNOSIS — E1149 Type 2 diabetes mellitus with other diabetic neurological complication: Secondary | ICD-10-CM | POA: Diagnosis not present

## 2020-10-23 LAB — URINALYSIS, ROUTINE W REFLEX MICROSCOPIC
Bilirubin, UA: NEGATIVE
Ketones, UA: NEGATIVE
Leukocytes,UA: NEGATIVE
Nitrite, UA: NEGATIVE
Protein,UA: NEGATIVE
RBC, UA: NEGATIVE
Specific Gravity, UA: 1.025 (ref 1.005–1.030)
Urobilinogen, Ur: 0.2 mg/dL (ref 0.2–1.0)
pH, UA: 5.5 (ref 5.0–7.5)

## 2020-10-23 LAB — WET PREP FOR TRICH, YEAST, CLUE
Clue Cell Exam: POSITIVE — AB
Trichomonas Exam: NEGATIVE
Yeast Exam: NEGATIVE

## 2020-10-23 LAB — BAYER DCA HB A1C WAIVED: HB A1C (BAYER DCA - WAIVED): 12.1 % — ABNORMAL HIGH (ref 4.8–5.6)

## 2020-10-23 MED ORDER — HYDROCORTISONE (PERIANAL) 2.5 % EX CREA: 1.0000 "application " | TOPICAL_CREAM | Freq: Two times a day (BID) | CUTANEOUS | 3 refills | Status: AC

## 2020-10-23 MED ORDER — HYDROCHLOROTHIAZIDE 25 MG PO TABS: ORAL_TABLET | ORAL | 1 refills | Status: DC

## 2020-10-23 MED ORDER — PRAVASTATIN SODIUM 20 MG PO TABS: 20.0000 mg | ORAL_TABLET | Freq: Every day | ORAL | 1 refills | Status: DC

## 2020-10-23 MED ORDER — SEMAGLUTIDE (1 MG/DOSE) 4 MG/3ML ~~LOC~~ SOPN: 1.0000 mg | PEN_INJECTOR | SUBCUTANEOUS | 6 refills | Status: DC

## 2020-10-23 MED ORDER — METRONIDAZOLE 500 MG PO TABS: 500.0000 mg | ORAL_TABLET | Freq: Two times a day (BID) | ORAL | 0 refills | Status: DC

## 2020-10-23 MED ORDER — METFORMIN HCL 500 MG PO TABS: ORAL_TABLET | ORAL | 0 refills | Status: DC

## 2020-10-23 MED ORDER — DULOXETINE HCL 20 MG PO CPEP: ORAL_CAPSULE | ORAL | 1 refills | Status: DC

## 2020-10-23 MED ORDER — LOSARTAN POTASSIUM 50 MG PO TABS: 50.0000 mg | ORAL_TABLET | Freq: Every day | ORAL | 1 refills | Status: DC

## 2020-10-23 NOTE — Assessment & Plan Note (Signed)
Under good control on current regimen. Continue current regimen. Continue to monitor. Call with any concerns. Refills given. Labs drawn today.   

## 2020-10-23 NOTE — Progress Notes (Signed)
BP 130/80   Pulse 66   Ht 5\' 4"  (1.626 m)   Wt 184 lb (83.5 kg)   BMI 31.58 kg/m    Subjective:    Patient ID: , female    DOB: Nov 11, 1965, 55 y.o.   MRN: [id number]  HPI: Lori Horne is a 55 y.o. female  Chief Complaint  Patient presents with   Pelvic Pain   DIABETES Hypoglycemic episodes:no Polydipsia/polyuria: yes Visual disturbance: no Chest pain: no Paresthesias: no Glucose Monitoring: yes  Accucheck frequency: Daily  Fasting glucose:240s-358 Taking Insulin?: no Blood Pressure Monitoring: not checking Retinal Examination: Not up to Date Foot Exam: Up to Date Diabetic Education: Completed Pneumovax: Up to Date Influenza: Up to Date Aspirin: no  URINARY SYMPTOMS Duration: 3 weeks Dysuria: no- pelvic discomfort Urinary frequency: yes Urgency: yes Small volume voids: yes Symptom severity: moderate Urinary incontinence: no Foul odor: no Hematuria: no Abdominal pain: no Back pain: no Suprapubic pain/pressure: yes Flank pain: no Fever:  no Vomiting: no Relief with cranberry juice: no Relief with pyridium: no Status: better Previous urinary tract infection: no Recurrent urinary tract infection: no History of sexually transmitted disease: no Vaginal discharge: no Treatments attempted:  nexium and increasing fluids   HYPERTENSION / HYPERLIPIDEMIA Satisfied with current treatment? yes Duration of hypertension: chronic BP monitoring frequency: not checking BP medication side effects: no Past BP meds: losartan, HCTZ,  Duration of hyperlipidemia: chronic Cholesterol medication side effects: yes Cholesterol supplements: none Past cholesterol medications: pravastatin Medication compliance: excellent compliance Aspirin: yes Recent stressors: no Recurrent headaches: no Visual changes: no Palpitations: no Dyspnea: no Chest pain: no Lower extremity edema: no Dizzy/lightheaded: no  Relevant past medical, surgical, family and social  history reviewed and updated as indicated. Interim medical history since our last visit reviewed. Allergies and medications reviewed and updated.  Review of Systems  Constitutional: Negative.   Respiratory: Negative.    Cardiovascular: Negative.   Gastrointestinal: Negative.   Genitourinary:  Positive for dysuria, frequency, pelvic pain and urgency. Negative for decreased urine volume, difficulty urinating, dyspareunia, enuresis, flank pain, genital sores, hematuria, menstrual problem, vaginal bleeding, vaginal discharge and vaginal pain.  Musculoskeletal: Negative.   Neurological: Negative.   Psychiatric/Behavioral: Negative.     Per HPI unless specifically indicated above     Objective:    BP 130/80   Pulse 66   Ht 5\' 4"  (1.626 m)   Wt 184 lb (83.5 kg)   BMI 31.58 kg/m   Wt Readings from Last 3 Encounters:  10/23/20 184 lb (83.5 kg)  07/27/20 183 lb 3.2 oz (83.1 kg)  04/20/20 184 lb 6.4 oz (83.6 kg)    Physical Exam Vitals and nursing note reviewed.  Constitutional:      General: She is not in acute distress.    Appearance: Normal appearance. She is not ill-appearing, toxic-appearing or diaphoretic.  HENT:     Head: Normocephalic and atraumatic.     Right Ear: External ear normal.     Left Ear: External ear normal.     Nose: Nose normal.     Mouth/Throat:     Mouth: Mucous membranes are moist.     Pharynx: Oropharynx is clear.  Eyes:     General: No scleral icterus.       Right eye: No discharge.        Left eye: No discharge.     Extraocular Movements: Extraocular movements intact.     Conjunctiva/sclera: Conjunctivae normal.     Pupils: Pupils are  equal, round, and reactive to light.  Cardiovascular:     Rate and Rhythm: Normal rate and regular rhythm.     Pulses: Normal pulses.     Heart sounds: Normal heart sounds. No murmur heard.   No friction rub. No gallop.  Pulmonary:     Effort: Pulmonary effort is normal. No respiratory distress.     Breath sounds:  Normal breath sounds. No stridor. No wheezing, rhonchi or rales.  Chest:     Chest wall: No tenderness.  Musculoskeletal:        General: Normal range of motion.     Cervical back: Normal range of motion and neck supple.  Skin:    General: Skin is warm and dry.     Capillary Refill: Capillary refill takes less than 2 seconds.     Coloration: Skin is not jaundiced or pale.     Findings: No bruising, erythema, lesion or rash.  Neurological:     General: No focal deficit present.     Mental Status: She is alert and oriented to person, place, and time. Mental status is at baseline.  Psychiatric:        Mood and Affect: Mood normal.        Behavior: Behavior normal.        Thought Content: Thought content normal.        Judgment: Judgment normal.    Results for orders placed or performed in visit on 10/02/20  HM MAMMOGRAPHY  Result Value Ref Range   HM Mammogram 0-4 Bi-Rad 0-4 Bi-Rad, Self Reported Normal      Assessment & Plan:   Problem List Items Addressed This Visit       Cardiovascular and Mediastinum   Hypertension    Under good control on current regimen. Continue current regimen. Continue to monitor. Call with any concerns. Refills given. Labs drawn today.       Relevant Medications   hydrochlorothiazide (HYDRODIURIL) 25 MG tablet   losartan (COZAAR) 50 MG tablet   pravastatin (PRAVACHOL) 20 MG tablet   Other Relevant Orders   Comprehensive metabolic panel     Endocrine   Type 2 diabetes mellitus ([facility])    Not under good control with A1c of 12.2- will increase her ozempic to 1mg  and discuss 1 month. If sugars still running high, will increase to 2mg . Call with any concerns.       Relevant Medications   Semaglutide, 1 MG/DOSE, 4 MG/3ML SOPN   losartan (COZAAR) 50 MG tablet   metFORMIN (GLUCOPHAGE) 500 MG tablet   pravastatin (PRAVACHOL) 20 MG tablet   Other Relevant Orders   Bayer DCA Hb A1c Waived (STAT)   Comprehensive metabolic panel   Hyperlipidemia  associated with type 2 diabetes mellitus ([facility])    Under good control on current regimen. Continue current regimen. Continue to monitor. Call with any concerns. Refills given. Labs drawn today.      Relevant Medications   Semaglutide, 1 MG/DOSE, 4 MG/3ML SOPN   hydrochlorothiazide (HYDRODIURIL) 25 MG tablet   losartan (COZAAR) 50 MG tablet   metFORMIN (GLUCOPHAGE) 500 MG tablet   pravastatin (PRAVACHOL) 20 MG tablet   Other Relevant Orders   Lipid Panel w/o Chol/HDL Ratio   Comprehensive metabolic panel     Other   Complicated grief    Under good control on current regimen. Continue current regimen. Continue to monitor. Call with any concerns. Refills given.        Other Visit Diagnoses  Pelvic pain    -  Primary   Improved. Likely due to BV and sugars. Will treat. Call with any concerns.    Relevant Orders   Urinalysis, Routine w reflex microscopic   WET PREP FOR TRICH, YEAST, CLUE        Follow up plan: Return in about 4 weeks (around 11/20/2020).

## 2020-10-23 NOTE — Assessment & Plan Note (Signed)
Not under good control with A1c of 12.2- will increase her ozempic to 1mg  and discuss 1 month. If sugars still running high, will increase to 2mg . Call with any concerns.

## 2020-10-23 NOTE — Assessment & Plan Note (Signed)
Under good control on current regimen. Continue current regimen. Continue to monitor. Call with any concerns. Refills given.   

## 2020-10-24 LAB — COMPREHENSIVE METABOLIC PANEL
ALT: 34 IU/L — ABNORMAL HIGH (ref 0–32)
AST: 29 IU/L (ref 0–40)
Albumin/Globulin Ratio: 1.3 (ref 1.2–2.2)
Albumin: 4.2 g/dL (ref 3.8–4.9)
Alkaline Phosphatase: 79 IU/L (ref 44–121)
BUN/Creatinine Ratio: 16 (ref 9–23)
BUN: 11 mg/dL (ref 6–24)
Bilirubin Total: 0.2 mg/dL (ref 0.0–1.2)
CO2: 24 mmol/L (ref 20–29)
Calcium: 9.4 mg/dL (ref 8.7–10.2)
Chloride: 96 mmol/L (ref 96–106)
Creatinine, Ser: 0.7 mg/dL (ref 0.57–1.00)
Globulin, Total: 3.2 g/dL (ref 1.5–4.5)
Glucose: 293 mg/dL — ABNORMAL HIGH (ref 70–99)
Potassium: 4.3 mmol/L (ref 3.5–5.2)
Sodium: 135 mmol/L (ref 134–144)
Total Protein: 7.4 g/dL (ref 6.0–8.5)
eGFR: 101 mL/min/{1.73_m2} (ref >59)

## 2020-10-24 LAB — LIPID PANEL W/O CHOL/HDL RATIO
Cholesterol, Total: 165 mg/dL (ref 100–199)
HDL: 35 mg/dL — ABNORMAL LOW (ref >39)
LDL Chol Calc (NIH): 83 mg/dL (ref 0–99)
Triglycerides: 283 mg/dL — ABNORMAL HIGH (ref 0–149)
VLDL Cholesterol Cal: 47 mg/dL — ABNORMAL HIGH (ref 5–40)

## 2020-10-27 ENCOUNTER — Ambulatory Visit: Payer: 59 | Admitting: Family Medicine

## 2020-11-16 ENCOUNTER — Telehealth (INDEPENDENT_AMBULATORY_CARE_PROVIDER_SITE_OTHER): Payer: 59 | Admitting: Nurse Practitioner

## 2020-11-16 ENCOUNTER — Encounter: Payer: Self-pay | Admitting: Nurse Practitioner

## 2020-11-16 DIAGNOSIS — J069 Acute upper respiratory infection, unspecified: Secondary | ICD-10-CM | POA: Diagnosis not present

## 2020-11-16 DIAGNOSIS — R509 Fever, unspecified: Secondary | ICD-10-CM | POA: Diagnosis not present

## 2020-11-16 LAB — VERITOR FLU A/B WAIVED
Influenza A: NEGATIVE
Influenza B: NEGATIVE

## 2020-11-16 MED ORDER — AZITHROMYCIN 250 MG PO TABS: ORAL_TABLET | ORAL | 0 refills | Status: AC

## 2020-11-16 NOTE — Addendum Note (Signed)
Addended by: Larae Grooms on: 11/16/2020 04:30 PM   Modules accepted: Orders

## 2020-11-16 NOTE — Progress Notes (Addendum)
There were no vitals taken for this visit.   Subjective:    Patient ID: Lori Horne, female    DOB: Jul 11, 1965, 54 y.o.   MRN: [id number]  HPI: Lori Horne is a 55 y.o. female  Chief Complaint  Patient presents with   URI    Pt states she came home from work yesterday with a fever and chills. States her ears and eyes are burning and feel like they have pressure behind and in them. Also states she is having body aches. States she took a home covid test this morning that was negative.    UPPER RESPIRATORY TRACT INFECTION Worst symptom: came home from work yesterday with fever Fever: yes Cough: no Shortness of breath: no Wheezing: no Chest pain:no Chest tightness: no Chest congestion: no Nasal congestion: yes Runny nose: no Post nasal drip: no Sneezing: no Sore throat: yes Swollen glands: no Sinus pressure: yes Headache: yes Face pain: yes Toothache: no Ear pain: yes  feels like ears are hot Ear pressure: no bilateral Eyes red/itching:no Eye drainage/crusting: no  Vomiting: no Rash: no Fatigue: yes Sick contacts: no Strep contacts: no  Context: worse Recurrent sinusitis: no Relief with OTC cold/cough medications: yes  Treatments attempted:  throat lozenges   Body Aches  Relevant past medical, surgical, family and social history reviewed and updated as indicated. Interim medical history since our last visit reviewed. Allergies and medications reviewed and updated.  Review of Systems  Constitutional:  Positive for fatigue and fever.  HENT:  Positive for congestion, ear pain, sinus pressure, sinus pain and sore throat. Negative for dental problem, postnasal drip, rhinorrhea and sneezing.   Respiratory:  Negative for cough, shortness of breath and wheezing.   Cardiovascular:  Negative for chest pain.  Gastrointestinal:  Negative for vomiting.  Skin:  Negative for rash.  Neurological:  Positive for headaches.   Per HPI unless specifically indicated above      Objective:    There were no vitals taken for this visit.  Wt Readings from Last 3 Encounters:  10/23/20 184 lb (83.5 kg)  07/27/20 183 lb 3.2 oz (83.1 kg)  04/20/20 184 lb 6.4 oz (83.6 kg)    Physical Exam Vitals and nursing note reviewed.  Constitutional:      General: She is not in acute distress.    Appearance: She is not ill-appearing.  HENT:     Head: Normocephalic.     Right Ear: Hearing normal.     Left Ear: Hearing normal.     Nose: Nose normal.  Pulmonary:     Effort: Pulmonary effort is normal. No respiratory distress.  Neurological:     Mental Status: She is alert.  Psychiatric:        Mood and Affect: Mood normal.        Behavior: Behavior normal.        Thought Content: Thought content normal.        Judgment: Judgment normal.    Results for orders placed or performed in visit on 10/23/20  WET PREP FOR TRICH, YEAST, CLUE   Specimen: Sterile Swab   Sterile Swab  Result Value Ref Range   Trichomonas Exam Negative Negative   Yeast Exam Negative Negative   Clue Cell Exam Positive (A) Negative  Urinalysis, Routine w reflex microscopic  Result Value Ref Range   Specific Gravity, UA 1.025 1.005 - 1.030   pH, UA 5.5 5.0 - 7.5   Color, UA Yellow Yellow   Appearance Ur Clear  Clear   Leukocytes,UA Negative Negative   Protein,UA Negative Negative/Trace   Glucose, UA 3+ (A) Negative   Ketones, UA Negative Negative   RBC, UA Negative Negative   Bilirubin, UA Negative Negative   Urobilinogen, Ur 0.2 0.2 - 1.0 mg/dL   Nitrite, UA Negative Negative  Bayer DCA Hb A1c Waived (STAT)  Result Value Ref Range   HB A1C (BAYER DCA - WAIVED) 12.1 (H) 4.8 - 5.6 %  Lipid Panel w/o Chol/HDL Ratio  Result Value Ref Range   Cholesterol, Total 165 100 - 199 mg/dL   Triglycerides 283 (H) 0 - 149 mg/dL   HDL 35 (L) >39 mg/dL   VLDL Cholesterol Cal 47 (H) 5 - 40 mg/dL   LDL Chol Calc (NIH) 83 0 - 99 mg/dL  Comprehensive metabolic panel  Result Value Ref Range   Glucose 293  (H) 70 - 99 mg/dL   BUN 11 6 - 24 mg/dL   Creatinine, Ser 0.70 0.57 - 1.00 mg/dL   eGFR 101 >59 mL/min/1.73   BUN/Creatinine Ratio 16 9 - 23   Sodium 135 134 - 144 mmol/L   Potassium 4.3 3.5 - 5.2 mmol/L   Chloride 96 96 - 106 mmol/L   CO2 24 20 - 29 mmol/L   Calcium 9.4 8.7 - 10.2 mg/dL   Total Protein 7.4 6.0 - 8.5 g/dL   Albumin 4.2 3.8 - 4.9 g/dL   Globulin, Total 3.2 1.5 - 4.5 g/dL   Albumin/Globulin Ratio 1.3 1.2 - 2.2   Bilirubin Total 0.2 0.0 - 1.2 mg/dL   Alkaline Phosphatase 79 44 - 121 IU/L   AST 29 0 - 40 IU/L   ALT 34 (H) 0 - 32 IU/L      Assessment & Plan:   Problem List Items Addressed This Visit   None Visit Diagnoses     Viral upper respiratory tract infection    -  Primary   Neg for Flu. Will send azithromycin for patient. Complete course of antibiotics.  Return to clinic if symptoms worsen or fail to improve.   Fever, unspecified fever cause       Will order Flu. If patient is positive will treat with Tamiflu. Will make recommendations based on lab results.    Relevant Orders   Veritor Flu A/B Waived        Follow up plan: Return if symptoms worsen or fail to improve.  This visit was completed via MyChart due to the restrictions of the COVID-19 pandemic. All issues as above were discussed and addressed. Physical exam was done as above through visual confirmation on MyChart. If it was felt that the patient should be evaluated in the office, they were directed there. The patient verbally consented to this visit. Location of the patient: Home Location of the provider: Office Those involved with this call:  Provider: Jon Billings, NP CMA: Yvonna Alanis, [place] Desk/Registration: Myrlene Broker This encounter was conducted via video.  I spent 15 dedicated to the care of this patient on the date of this encounter to include previsit review of 20, face to face time with the patient, and post visit ordering of testing.

## 2020-11-16 NOTE — Progress Notes (Signed)
Called and made patient aware of results.

## 2020-11-20 ENCOUNTER — Telehealth: Payer: 59 | Admitting: Nurse Practitioner

## 2020-11-23 ENCOUNTER — Other Ambulatory Visit: Payer: Self-pay

## 2020-11-23 ENCOUNTER — Encounter: Payer: Self-pay | Admitting: Family Medicine

## 2020-11-23 ENCOUNTER — Ambulatory Visit: Payer: 59 | Admitting: Family Medicine

## 2020-11-23 VITALS — BP 120/77 | HR 77 | Temp 98.0°F | Wt 176.0 lb

## 2020-11-23 DIAGNOSIS — R0981 Nasal congestion: Secondary | ICD-10-CM

## 2020-11-23 DIAGNOSIS — E1149 Type 2 diabetes mellitus with other diabetic neurological complication: Secondary | ICD-10-CM

## 2020-11-23 MED ORDER — PREDNISONE 50 MG PO TABS: 50.0000 mg | ORAL_TABLET | Freq: Every day | ORAL | 0 refills | Status: DC

## 2020-11-23 NOTE — Progress Notes (Signed)
BP 120/77   Pulse 77   Temp 98 F (36.7 C)   Wt 176 lb (79.8 kg)   SpO2 99%   BMI 30.21 kg/m    Subjective:    Patient ID: Lori Horne, female    DOB: 1965/06/30, 55 y.o.   MRN: [id number]  HPI: Lori Horne is a 55 y.o. female  Chief Complaint  Patient presents with   Diabetes   Sinusitis    Patient states she is still having nasal congestion. Was treated for a sinus infection recently and has completed prescribed medications    DIABETES Hypoglycemic episodes:no Polydipsia/polyuria: no Visual disturbance: no Chest pain: no Paresthesias: no Glucose Monitoring: yes  Accucheck frequency: Daily  Fasting glucose: Taking Insulin?: no Blood Pressure Monitoring: a few times a month Retinal Examination: Up to Date Foot Exam: Up to Date Diabetic Education: Completed Pneumovax: Up to Date Influenza: Not Up to Date Aspirin: no  UPPER RESPIRATORY TRACT INFECTION Duration: 8-9 days Worst symptom: congestion Fever: no Cough: yes Shortness of breath: no Wheezing: no Chest pain: no Chest tightness: no Chest congestion: no Nasal congestion: yes Runny nose: yes Post nasal drip: yes Sneezing: no Sore throat: no Swollen glands: no Sinus pressure: yes Headache: yes Face pain: no Toothache: no Ear pain: yes left Ear pressure: yes left Eyes red/itching:no Eye drainage/crusting: no  Vomiting: no Rash: no Fatigue: yes Sick contacts: no Strep contacts: no  Context: better Recurrent sinusitis: no Relief with OTC cold/cough medications: no  Treatments attempted: cold/sinus and antibiotics    Relevant past medical, surgical, family and social history reviewed and updated as indicated. Interim medical history since our last visit reviewed. Allergies and medications reviewed and updated.  Review of Systems  Constitutional: Negative.   HENT:  Positive for congestion, ear pain, postnasal drip, rhinorrhea, sinus pressure and sinus pain. Negative for dental problem,  drooling, ear discharge, facial swelling, hearing loss, mouth sores, nosebleeds, sneezing, sore throat, tinnitus, trouble swallowing and voice change.   Eyes: Negative.   Respiratory:  Positive for cough. Negative for apnea, choking, chest tightness, shortness of breath, wheezing and stridor.   Cardiovascular: Negative.   Gastrointestinal: Negative.   Musculoskeletal: Negative.   Psychiatric/Behavioral: Negative.     Per HPI unless specifically indicated above     Objective:    BP 120/77   Pulse 77   Temp 98 F (36.7 C)   Wt 176 lb (79.8 kg)   SpO2 99%   BMI 30.21 kg/m   Wt Readings from Last 3 Encounters:  11/23/20 176 lb (79.8 kg)  10/23/20 184 lb (83.5 kg)  07/27/20 183 lb 3.2 oz (83.1 kg)    Physical Exam Vitals and nursing note reviewed.  Constitutional:      General: She is not in acute distress.    Appearance: Normal appearance. She is not ill-appearing, toxic-appearing or diaphoretic.  HENT:     Head: Normocephalic and atraumatic.     Right Ear: Tympanic membrane, ear canal and external ear normal.     Left Ear: Tympanic membrane, ear canal and external ear normal.     Nose: Rhinorrhea present. No congestion.     Mouth/Throat:     Mouth: Mucous membranes are moist.     Pharynx: Oropharynx is clear. No oropharyngeal exudate or posterior oropharyngeal erythema.  Eyes:     General: No scleral icterus.       Right eye: No discharge.        Left eye: No discharge.  Extraocular Movements: Extraocular movements intact.     Conjunctiva/sclera: Conjunctivae normal.     Pupils: Pupils are equal, round, and reactive to light.  Cardiovascular:     Rate and Rhythm: Normal rate and regular rhythm.     Pulses: Normal pulses.     Heart sounds: Normal heart sounds. No murmur heard.   No friction rub. No gallop.  Pulmonary:     Effort: Pulmonary effort is normal. No respiratory distress.     Breath sounds: Normal breath sounds. No stridor. No wheezing, rhonchi or rales.   Chest:     Chest wall: No tenderness.  Musculoskeletal:        General: Normal range of motion.     Cervical back: Normal range of motion and neck supple.  Skin:    General: Skin is warm and dry.     Capillary Refill: Capillary refill takes less than 2 seconds.     Coloration: Skin is not jaundiced or pale.     Findings: No bruising, erythema, lesion or rash.  Neurological:     General: No focal deficit present.     Mental Status: She is alert and oriented to person, place, and time. Mental status is at baseline.  Psychiatric:        Mood and Affect: Mood normal.        Behavior: Behavior normal.        Thought Content: Thought content normal.        Judgment: Judgment normal.    Results for orders placed or performed in visit on 11/16/20  Veritor Flu A/B Waived  Result Value Ref Range   Influenza A Negative Negative   Influenza B Negative Negative      Assessment & Plan:   Problem List Items Addressed This Visit       Endocrine   Type 2 diabetes mellitus (HCC) - Primary    Tolerating the 1mg  ozempic well. Will leave her here for another 2 months. If A1c still high, will increase next visit to 2mg .       Relevant Orders   Basic metabolic panel   Other Visit Diagnoses     Nasal congestion       Will treat with prednisone. Call if not getting better or getting worse. Continue to monitor.         Follow up plan: Return in about 2 months (around 01/23/2021).

## 2020-11-23 NOTE — Assessment & Plan Note (Signed)
Tolerating the 1mg  ozempic well. Will leave her here for another 2 months. If A1c still high, will increase next visit to 2mg .

## 2020-11-24 LAB — BASIC METABOLIC PANEL
BUN/Creatinine Ratio: 15 (ref 9–23)
BUN: 14 mg/dL (ref 6–24)
CO2: 27 mmol/L (ref 20–29)
Calcium: 9 mg/dL (ref 8.7–10.2)
Chloride: 92 mmol/L — ABNORMAL LOW (ref 96–106)
Creatinine, Ser: 0.95 mg/dL (ref 0.57–1.00)
Glucose: 326 mg/dL — ABNORMAL HIGH (ref 70–99)
Potassium: 3.3 mmol/L — ABNORMAL LOW (ref 3.5–5.2)
Sodium: 134 mmol/L (ref 134–144)
eGFR: 70 mL/min/{1.73_m2} (ref >59)

## 2020-12-21 ENCOUNTER — Encounter: Payer: Self-pay | Admitting: Family Medicine

## 2020-12-21 ENCOUNTER — Telehealth: Payer: Self-pay | Admitting: Family Medicine

## 2020-12-21 NOTE — Telephone Encounter (Signed)
Pt called and stated that the Rx for Ozempic needs a PA/ please advise asap

## 2020-12-22 ENCOUNTER — Telehealth: Payer: Self-pay | Admitting: Family Medicine

## 2020-12-22 NOTE — Telephone Encounter (Signed)
Informed pt that this was okay

## 2020-12-22 NOTE — Telephone Encounter (Signed)
Responded via MyChart.

## 2020-12-22 NOTE — Telephone Encounter (Signed)
Patient is asking if its ok, for her husband to come pick up her sample. Please call back

## 2020-12-26 ENCOUNTER — Telehealth: Payer: Self-pay

## 2020-12-26 ENCOUNTER — Encounter: Payer: Self-pay | Admitting: Family Medicine

## 2020-12-26 NOTE — Telephone Encounter (Signed)
Technical brewer to check status of patient's prescription for Ozempic. Pharmacist states the prescription wasn't being processed and that she would go ahead and process prescription for the patient. Patient will be able to pick up Ozempic prescription tomorrow after 11 am. Patient was notified and made aware. Patient verbalized understanding and has no further questions.

## 2020-12-27 ENCOUNTER — Other Ambulatory Visit: Payer: Self-pay | Admitting: Family Medicine

## 2020-12-27 MED ORDER — SEMAGLUTIDE (1 MG/DOSE) 4 MG/3ML ~~LOC~~ SOPN
1.0000 mg | PEN_INJECTOR | SUBCUTANEOUS | 0 refills | Status: DC
Start: 2020-12-27 — End: 2021-03-19

## 2020-12-27 NOTE — Telephone Encounter (Signed)
Patient called in to inform Dr Lori Horne that her Semaglutide,0.25 or 0.5MG /DOS, (OZEMPIC, 0.25 OR 0.5 MG/DOSE,) 2 MG/1.5ML SOPN need to be sent in for a 90 day supply along with all her other maintenance medication for insurance purpose. Patient have been advised by her insurance of this please advise and call patient

## 2020-12-28 ENCOUNTER — Other Ambulatory Visit: Payer: Self-pay | Admitting: Family Medicine

## 2020-12-28 NOTE — Telephone Encounter (Signed)
Requested medication (s) are due for refill today: Yes  Requested medication (s) are on the active medication list: Yes  Last refill:  Prednisone 11/23/20  Flagyl  10/20/20  Future visit scheduled: Yes  Notes to clinic:  See request.    Requested Prescriptions  Pending Prescriptions Disp Refills   predniSONE (DELTASONE) 50 MG tablet [Pharmacy Med Name: PREDNISONE 50MG  (FIFTY MG) TABLETS] 5 tablet 0    Sig: TAKE 1 TABLET(50 MG) BY MOUTH DAILY WITH BREAKFAST     Not Delegated - Endocrinology:  Oral Corticosteroids Failed - 12/28/2020  9:12 AM      Failed - This refill cannot be delegated      Passed - Last BP in normal range    BP Readings from Last 1 Encounters:  11/23/20 120/77          Passed - Valid encounter within last 6 months    Recent Outpatient Visits           1 month ago Type 2 diabetes mellitus with other neurologic complication, without long-term current use of insulin (HCC)   [facility] [place], Megan P, DO   1 month ago Viral upper respiratory tract infection   [facility] [place], [place], [place]   2 months ago Pelvic pain   [facility] [place], Megan P, DO   5 months ago Type 2 diabetes mellitus with other neurologic complication, without long-term current use of insulin (HCC)   [facility] [place], Megan P, DO   8 months ago Routine general medical examination at a health care facility   [facility], NP       Future Appointments             In 4 weeks Larae Grooms, Megan P, DO [facility], [facility]             metroNIDAZOLE (FLAGYL) 500 MG tablet [Pharmacy Med Name: METRONIDAZOLE 500MG  TABLETS] 14 tablet 0    Sig: TAKE 1 TABLET(500 MG) BY MOUTH TWICE DAILY     Off-Protocol Failed - 12/28/2020  9:12 AM      Failed - Medication not assigned to a protocol, review manually.      Passed - Valid encounter within last 12 months    Recent Outpatient Visits            1 month ago Type 2 diabetes mellitus with other neurologic complication, without long-term current use of insulin (HCC)   [facility] [place], Megan P, DO   1 month ago Viral upper respiratory tract infection   [facility] [place], NP   2 months ago Pelvic pain   [facility] [place], Megan P, DO   5 months ago Type 2 diabetes mellitus with other neurologic complication, without long-term current use of insulin (HCC)   [facility] [place], Megan P, DO   8 months ago Routine general medical examination at a health care facility   [facility] [place], NP       Future Appointments             In 4 weeks [facility], Megan P, DO [facility], [facility]            Signed Prescriptions Disp Refills   levocetirizine (XYZAL) 5 MG tablet 90 tablet 0    Sig: TAKE 1 TABLET(5 MG) BY MOUTH EVERY EVENING     Ear, Nose, and Throat:  Antihistamines Passed - 12/28/2020  9:12 AM      Passed - Valid encounter within last 12 months    Recent Outpatient Visits           1 month ago Type 2 diabetes mellitus with other neurologic complication, without long-term current use of insulin (HCC)   [facility] [place], Megan P, DO   1 month ago Viral upper respiratory tract infection   [facility] Larae Grooms, NP   2 months ago Pelvic pain   [facility] [place], Megan P, DO   5 months ago Type 2 diabetes mellitus with other neurologic complication, without long-term current use of insulin (HCC)   [facility] [place], Megan P, DO   8 months ago Routine general medical examination at a health care facility   [facility] Larae Grooms, NP       Future Appointments             In 4 weeks Laural Benes, Oralia Rud, DO [facility], [facility]

## 2021-01-25 ENCOUNTER — Ambulatory Visit: Payer: 59 | Admitting: Family Medicine

## 2021-02-09 ENCOUNTER — Ambulatory Visit: Payer: 59 | Admitting: Family Medicine

## 2021-02-09 ENCOUNTER — Other Ambulatory Visit: Payer: Self-pay

## 2021-02-09 ENCOUNTER — Encounter: Payer: Self-pay | Admitting: Family Medicine

## 2021-02-09 VITALS — BP 117/84 | HR 77 | Temp 98.0°F | Ht 63.0 in | Wt 179.4 lb

## 2021-02-09 DIAGNOSIS — E1149 Type 2 diabetes mellitus with other diabetic neurological complication: Secondary | ICD-10-CM | POA: Diagnosis not present

## 2021-02-09 LAB — BAYER DCA HB A1C WAIVED: HB A1C (BAYER DCA - WAIVED): 10.9 % — ABNORMAL HIGH (ref 4.8–5.6)

## 2021-02-09 MED ORDER — TIRZEPATIDE 5 MG/0.5ML ~~LOC~~ SOAJ: 5.0000 mg | SUBCUTANEOUS | 0 refills | Status: DC

## 2021-02-09 NOTE — Progress Notes (Signed)
BP 117/84    Pulse 77    Temp 98 F (36.7 C)    Ht 5\' 3"  (1.6 m)    Wt 179 lb 6.4 oz (81.4 kg)    SpO2 98%    BMI 31.78 kg/m    Subjective:    Patient ID: Lori Horne, female    DOB: 03-Aug-1965, 56 y.o.   MRN: [id number]  HPI: Lori Horne is a 56 y.o. female  Chief Complaint  Patient presents with   Diabetes   DIABETES Hypoglycemic episodes:no Polydipsia/polyuria: no Visual disturbance: no Chest pain: no Paresthesias: no Glucose Monitoring: no  Accucheck frequency: Not Checking Taking Insulin?: no Blood Pressure Monitoring: not checking Retinal Examination: Up to Date Foot Exam: Up to Date Diabetic Education: Completed Pneumovax: Up to Date Influenza: Up to Date Aspirin: yes   Relevant past medical, surgical, family and social history reviewed and updated as indicated. Interim medical history since our last visit reviewed. Allergies and medications reviewed and updated.  Review of Systems  Constitutional: Negative.   Respiratory: Negative.    Cardiovascular: Negative.   Musculoskeletal: Negative.   Neurological: Negative.   Psychiatric/Behavioral: Negative.     Per HPI unless specifically indicated above     Objective:    BP 117/84    Pulse 77    Temp 98 F (36.7 C)    Ht 5\' 3"  (1.6 m)    Wt 179 lb 6.4 oz (81.4 kg)    SpO2 98%    BMI 31.78 kg/m   Wt Readings from Last 3 Encounters:  02/09/21 179 lb 6.4 oz (81.4 kg)  11/23/20 176 lb (79.8 kg)  10/23/20 184 lb (83.5 kg)    Physical Exam Vitals and nursing note reviewed.  Constitutional:      General: She is not in acute distress.    Appearance: Normal appearance. She is not ill-appearing, toxic-appearing or diaphoretic.  HENT:     Head: Normocephalic and atraumatic.     Right Ear: External ear normal.     Left Ear: External ear normal.     Nose: Nose normal.     Mouth/Throat:     Mouth: Mucous membranes are moist.     Pharynx: Oropharynx is clear.  Eyes:     General: No scleral icterus.        Right eye: No discharge.        Left eye: No discharge.     Extraocular Movements: Extraocular movements intact.     Conjunctiva/sclera: Conjunctivae normal.     Pupils: Pupils are equal, round, and reactive to light.  Cardiovascular:     Rate and Rhythm: Normal rate and regular rhythm.     Pulses: Normal pulses.     Heart sounds: Normal heart sounds. No murmur heard.   No friction rub. No gallop.  Pulmonary:     Effort: Pulmonary effort is normal. No respiratory distress.     Breath sounds: Normal breath sounds. No stridor. No wheezing, rhonchi or rales.  Chest:     Chest wall: No tenderness.  Musculoskeletal:        General: Normal range of motion.     Cervical back: Normal range of motion and neck supple.  Skin:    General: Skin is warm and dry.     Capillary Refill: Capillary refill takes less than 2 seconds.     Coloration: Skin is not jaundiced or pale.     Findings: No bruising, erythema, lesion or rash.  Neurological:  General: No focal deficit present.     Mental Status: She is alert and oriented to person, place, and time. Mental status is at baseline.  Psychiatric:        Mood and Affect: Mood normal.        Behavior: Behavior normal.        Thought Content: Thought content normal.        Judgment: Judgment normal.    Results for orders placed or performed in visit on 02/09/21  Bayer DCA Hb A1c Waived  Result Value Ref Range   HB A1C (BAYER DCA - WAIVED) 10.9 (H) 4.8 - 5.6 %      Assessment & Plan:   Problem List Items Addressed This Visit       Endocrine   Type 2 diabetes mellitus ([place]) - Primary    Better at 10.9 from 12.1, but still not under good control. Will change from ozempic to montjaro. Has 4 pens of ozempic left. Will check back in 2 months.       Relevant Medications   tirzepatide (MOUNJARO) 5 MG/0.5ML Pen   Other Relevant Orders   Bayer DCA Hb A1c Waived (Completed)     Follow up plan: Return in about 2 months (around  04/09/2021).

## 2021-02-09 NOTE — Assessment & Plan Note (Signed)
Better at 10.9 from 12.1, but still not under good control. Will change from ozempic to montjaro. Has 4 pens of ozempic left. Will check back in 2 months.

## 2021-03-19 ENCOUNTER — Other Ambulatory Visit: Payer: Self-pay | Admitting: Family Medicine

## 2021-03-19 NOTE — Telephone Encounter (Signed)
Requested Prescriptions  ?Pending Prescriptions Disp Refills  ?? OZEMPIC, 1 MG/DOSE, 4 MG/3ML SOPN [Pharmacy Med Name: OZEMPIC 1MG  PER DOSE (1X4MG  PEN)] 9 mL 0  ?  Sig: INJECT 1 MG UNDER THE SKIN ONCE A WEEK  ?  ? Endocrinology:  Diabetes - GLP-1 Receptor Agonists - semaglutide Failed - 03/19/2021  7:23 AM  ?  ?  Failed - HBA1C in normal range and within 180 days  ?  Hemoglobin A1C  ?Date Value Ref Range Status  ?11/15/2015 7.4  Final  ? ?HB A1C (BAYER DCA - WAIVED)  ?Date Value Ref Range Status  ?02/09/2021 10.9 (H) 4.8 - 5.6 % Final  ?  Comment:  ?           Prediabetes: 5.7 - 6.4 ?         Diabetes: >6.4 ?         Glycemic control for adults with diabetes: <7.0 ?  ?   ?  ?  Passed - Cr in normal range and within 360 days  ?  Creatinine, Ser  ?Date Value Ref Range Status  ?11/23/2020 0.95 0.57 - 1.00 mg/dL Final  ?   ?  ?  Passed - Valid encounter within last 6 months  ?  Recent Outpatient Visits   ?      ? 1 month ago Type 2 diabetes mellitus with other neurologic complication, without long-term current use of insulin (HCC)  ? [facility] [place], [place] P, DO  ? 3 months ago Type 2 diabetes mellitus with other neurologic complication, without long-term current use of insulin (HCC)  ? [facility] [place], [place] P, DO  ? 4 months ago Viral upper respiratory tract infection  ? [facility], NP  ? 4 months ago Pelvic pain  ? [facility] [place], [place] P, DO  ? 7 months ago Type 2 diabetes mellitus with other neurologic complication, without long-term current use of insulin (HCC)  ? [facility] [place], [place] P, DO  ?  ?  ?Future Appointments   ?        ? In 3 weeks [place], Laural Benes, DO [facility], [facility]  ?  ? ?  ?  ?  ? ?

## 2021-03-19 NOTE — Telephone Encounter (Signed)
Refilled 03/19/2021  ?Requested Prescriptions  ?Pending Prescriptions Disp Refills  ?? OZEMPIC, 1 MG/DOSE, 4 MG/3ML SOPN [Pharmacy Med Name: OZEMPIC 1MG  PER DOSE (1X4MG  PEN)] 9 mL 0  ?  Sig: INJECT 1 MG UNDER THE SKIN ONCE A WEEK  ?  ? Endocrinology:  Diabetes - GLP-1 Receptor Agonists - semaglutide Failed - 03/19/2021  7:24 AM  ?  ?  Failed - HBA1C in normal range and within 180 days  ?  Hemoglobin A1C  ?Date Value Ref Range Status  ?11/15/2015 7.4  Final  ? ?HB A1C (BAYER DCA - WAIVED)  ?Date Value Ref Range Status  ?02/09/2021 10.9 (H) 4.8 - 5.6 % Final  ?  Comment:  ?           Prediabetes: 5.7 - 6.4 ?         Diabetes: >6.4 ?         Glycemic control for adults with diabetes: <7.0 ?  ?   ?  ?  Passed - Cr in normal range and within 360 days  ?  Creatinine, Ser  ?Date Value Ref Range Status  ?11/23/2020 0.95 0.57 - 1.00 mg/dL Final  ?   ?  ?  Passed - Valid encounter within last 6 months  ?  Recent Outpatient Visits   ?      ? 1 month ago Type 2 diabetes mellitus with other neurologic complication, without long-term current use of insulin ([place])  ? [place], [place] P, DO  ? 3 months ago Type 2 diabetes mellitus with other neurologic complication, without long-term current use of insulin ([place])  ? [place], [place] P, DO  ? 4 months ago Viral upper respiratory tract infection  ? [place], NP  ? 4 months ago Pelvic pain  ? [place], [place] P, DO  ? 7 months ago Type 2 diabetes mellitus with other neurologic complication, without long-term current use of insulin ([place])  ? [place], [place] P, DO  ?  ?  ?Future Appointments   ?        ? In 3 weeks Wynetta Emery, Barb Merino, DO [place], [facility]  ?  ? ?  ?  ?  ? ?

## 2021-04-11 ENCOUNTER — Encounter: Payer: Self-pay | Admitting: Family Medicine

## 2021-04-11 ENCOUNTER — Ambulatory Visit: Payer: 59 | Admitting: Family Medicine

## 2021-04-11 DIAGNOSIS — E1149 Type 2 diabetes mellitus with other diabetic neurological complication: Secondary | ICD-10-CM | POA: Diagnosis not present

## 2021-04-11 MED ORDER — TIRZEPATIDE 2.5 MG/0.5ML ~~LOC~~ SOAJ: 2.5000 mg | SUBCUTANEOUS | 1 refills | Status: DC

## 2021-04-11 MED ORDER — TIRZEPATIDE 5 MG/0.5ML ~~LOC~~ SOAJ: 5.0000 mg | SUBCUTANEOUS | 0 refills | Status: DC

## 2021-04-11 NOTE — Progress Notes (Signed)
? ?BP 112/75   Pulse 75   Temp 97.9 ?F (36.6 ?C)   Wt 178 lb 12.8 oz (81.1 kg)   SpO2 98%   BMI 31.67 kg/m?   ? ?Subjective:  ? ? Patient ID: Lori Horne, female    DOB: Sep 14, 1965, 56 y.o.   MRN: [id number] ? ?HPI: ?Lori Horne is a 56 y.o. female ? ?Chief Complaint  ?Patient presents with  ? Diabetes  ?  Patient states she is doing well on Mounjaro   ? ?DIABETES ?Hypoglycemic episodes:no ?Polydipsia/polyuria: no ?Visual disturbance: no ?Chest pain: no ?Paresthesias: no ?Glucose Monitoring: yes ? Accucheck frequency: Daily ? Fasting glucose: 120-250 ?Taking Insulin?: no ?Blood Pressure Monitoring: not checking ?Retinal Examination: Up to Date ?Foot Exam: Up to Date ?Diabetic Education: Completed ?Pneumovax: Up to Date ?Influenza: Up to Date ?Aspirin: yes ? ?Relevant past medical, surgical, family and social history reviewed and updated as indicated. Interim medical history since our last visit reviewed. ?Allergies and medications reviewed and updated. ? ?Review of Systems  ?Constitutional: Negative.   ?Respiratory: Negative.    ?Cardiovascular: Negative.   ?Musculoskeletal: Negative.   ?Neurological: Negative.   ?Psychiatric/Behavioral: Negative.    ? ?Per HPI unless specifically indicated above ? ?   ?Objective:  ?  ?BP 112/75   Pulse 75   Temp 97.9 ?F (36.6 ?C)   Wt 178 lb 12.8 oz (81.1 kg)   SpO2 98%   BMI 31.67 kg/m?   ?Wt Readings from Last 3 Encounters:  ?04/11/21 178 lb 12.8 oz (81.1 kg)  ?02/09/21 179 lb 6.4 oz (81.4 kg)  ?11/23/20 176 lb (79.8 kg)  ?  ?Physical Exam ?Vitals and nursing note reviewed.  ?Constitutional:   ?   General: She is not in acute distress. ?   Appearance: Normal appearance. She is not ill-appearing, toxic-appearing or diaphoretic.  ?HENT:  ?   Head: Normocephalic and atraumatic.  ?   Right Ear: External ear normal.  ?   Left Ear: External ear normal.  ?   Nose: Nose normal.  ?   Mouth/Throat:  ?   Mouth: Mucous membranes are moist.  ?   Pharynx: Oropharynx is clear.   ?Eyes:  ?   General: No scleral icterus.    ?   Right eye: No discharge.     ?   Left eye: No discharge.  ?   Extraocular Movements: Extraocular movements intact.  ?   Conjunctiva/sclera: Conjunctivae normal.  ?   Pupils: Pupils are equal, round, and reactive to light.  ?Cardiovascular:  ?   Rate and Rhythm: Normal rate and regular rhythm.  ?   Pulses: Normal pulses.  ?   Heart sounds: Normal heart sounds. No murmur heard. ?  No friction rub. No gallop.  ?Pulmonary:  ?   Effort: Pulmonary effort is normal. No respiratory distress.  ?   Breath sounds: Normal breath sounds. No stridor. No wheezing, rhonchi or rales.  ?Chest:  ?   Chest wall: No tenderness.  ?Musculoskeletal:     ?   General: Normal range of motion.  ?   Cervical back: Normal range of motion and neck supple.  ?Skin: ?   General: Skin is warm and dry.  ?   Capillary Refill: Capillary refill takes less than 2 seconds.  ?   Coloration: Skin is not jaundiced or pale.  ?   Findings: No bruising, erythema, lesion or rash.  ?Neurological:  ?   General: No focal deficit present.  ?  Mental Status: She is alert and oriented to person, place, and time. Mental status is at baseline.  ?Psychiatric:     ?   Mood and Affect: Mood normal.     ?   Behavior: Behavior normal.     ?   Thought Content: Thought content normal.     ?   Judgment: Judgment normal.  ? ? ?Results for orders placed or performed in visit on 02/09/21  ?HM DIABETES EYE EXAM  ?Result Value Ref Range  ? HM Diabetic Eye Exam No Retinopathy No Retinopathy  ? ?   ?Assessment & Plan:  ? ?Problem List Items Addressed This Visit   ? ?  ? Endocrine  ? Type 2 diabetes mellitus (HCC)  ?  Tolerating her mounjaro well. Will increase her dose to 7.5mg  and recheck by mychart in 1 month, if sugars still elevated will increase to 10mg .  ?  ?  ? Relevant Medications  ? tirzepatide [facility]) 5 MG/0.5ML Pen  ? tirzepatide (MOUNJARO) 2.5 MG/0.5ML Pen  ?  ? ?Follow up plan: ?Return in about 2 months (around  06/11/2021). ? ? ? ? ? ?

## 2021-04-11 NOTE — Assessment & Plan Note (Signed)
Tolerating her mounjaro well. Will increase her dose to 7.5mg and recheck by mychart in 1 month, if sugars still elevated will increase to 10mg.  ?

## 2021-05-11 ENCOUNTER — Encounter: Payer: Self-pay | Admitting: Family Medicine

## 2021-05-24 ENCOUNTER — Encounter: Payer: Self-pay | Admitting: Family Medicine

## 2021-05-25 ENCOUNTER — Encounter: Payer: Self-pay | Admitting: Family Medicine

## 2021-05-25 MED ORDER — TIRZEPATIDE 10 MG/0.5ML ~~LOC~~ SOAJ: 10.0000 mg | SUBCUTANEOUS | 6 refills | Status: DC

## 2021-05-25 NOTE — Telephone Encounter (Signed)
If we have a 10mg  pen we can give it to her- we may need to check with to see if he knows any pharmacies that have it.

## 2021-05-28 ENCOUNTER — Other Ambulatory Visit: Payer: Self-pay | Admitting: Family Medicine

## 2021-05-28 ENCOUNTER — Telehealth: Payer: Self-pay | Admitting: Family Medicine

## 2021-05-28 ENCOUNTER — Encounter (INDEPENDENT_AMBULATORY_CARE_PROVIDER_SITE_OTHER): Payer: Self-pay

## 2021-05-28 MED ORDER — TIRZEPATIDE 10 MG/0.5ML ~~LOC~~ SOAJ
10.0000 mg | SUBCUTANEOUS | 0 refills | Status: DC
Start: 2021-05-28 — End: 2021-09-24

## 2021-05-28 MED ORDER — TIRZEPATIDE 5 MG/0.5ML ~~LOC~~ SOAJ: 10.0000 mg | SUBCUTANEOUS | 0 refills | Status: DC

## 2021-05-28 MED ORDER — TIRZEPATIDE 10 MG/0.5ML ~~LOC~~ SOAJ: 10.0000 mg | SUBCUTANEOUS | 0 refills | Status: DC

## 2021-05-28 NOTE — Progress Notes (Signed)
Mounjaro

## 2021-05-28 NOTE — Telephone Encounter (Signed)
Pt is calling stating the pharmatist states that the insurance will not pay for the tirzepatide [facility]) 10 MG/0.5ML Pen [[id number]] Pt would like to know does she need a PA or to be prescribed another medication?  Cb- [phone]

## 2021-05-29 NOTE — Telephone Encounter (Signed)
PA initiated via CoverMyMeds  Key: [id number] Waiting on determination.

## 2021-05-29 NOTE — Telephone Encounter (Signed)
PA has been denied for Mounjaro 5MG /0.5ML  Will place determination in folder when faxed over.

## 2021-05-29 NOTE — Telephone Encounter (Signed)
Returned patient call to make aware PA is needed for [facility] 10 MG/0.5ML. Patient verbalized understanding.

## 2021-06-12 ENCOUNTER — Ambulatory Visit: Payer: 59 | Admitting: Family Medicine

## 2021-06-21 ENCOUNTER — Ambulatory Visit: Payer: 59 | Admitting: Family Medicine

## 2021-06-22 ENCOUNTER — Encounter: Payer: Self-pay | Admitting: Family Medicine

## 2021-06-22 ENCOUNTER — Ambulatory Visit: Payer: 59 | Admitting: Family Medicine

## 2021-06-22 VITALS — BP 116/73 | HR 78 | Temp 97.6°F | Wt 177.4 lb

## 2021-06-22 DIAGNOSIS — Z0000 Encounter for general adult medical examination without abnormal findings: Secondary | ICD-10-CM | POA: Diagnosis not present

## 2021-06-22 DIAGNOSIS — E1169 Type 2 diabetes mellitus with other specified complication: Secondary | ICD-10-CM | POA: Diagnosis not present

## 2021-06-22 DIAGNOSIS — E785 Hyperlipidemia, unspecified: Secondary | ICD-10-CM

## 2021-06-22 DIAGNOSIS — E1149 Type 2 diabetes mellitus with other diabetic neurological complication: Secondary | ICD-10-CM | POA: Diagnosis not present

## 2021-06-22 DIAGNOSIS — I10 Essential (primary) hypertension: Secondary | ICD-10-CM | POA: Diagnosis not present

## 2021-06-22 LAB — BAYER DCA HB A1C WAIVED: HB A1C (BAYER DCA - WAIVED): 9.6 % — ABNORMAL HIGH (ref 4.8–5.6)

## 2021-06-22 MED ORDER — ESOMEPRAZOLE MAGNESIUM 40 MG PO CPDR: 40.0000 mg | DELAYED_RELEASE_CAPSULE | Freq: Every day | ORAL | 3 refills | Status: DC

## 2021-06-22 NOTE — Progress Notes (Unsigned)
BP 116/73   Pulse 78   Temp 97.6 F (36.4 C)   Wt 177 lb 6.4 oz (80.5 kg)   SpO2 98%   BMI 31.42 kg/m    Subjective:    Patient ID: Lori Horne, female    DOB: 1965-04-05, 56 y.o.   MRN: [id number]  HPI: Lori Horne is a 56 y.o. female presenting on 06/22/2021 for comprehensive medical examination. Current medical complaints include:  DIABETES- was off her mounjaro for about a month, but has been on it for about 2 weeks Hypoglycemic episodes:no Polydipsia/polyuria: no Visual disturbance: no Chest pain: no Paresthesias: no Glucose Monitoring: no  Accucheck frequency:  occasionally Taking Insulin?: {Blank single:19197::"yes","no"} Blood Pressure Monitoring: {Blank single:19197::"not checking","rarely","daily","weekly","monthly","a few times a day","a few times a week","a few times a month"} Retinal Examination: {Blank single:19197::"Up to Date","Not up to Date"} Foot Exam: {Blank single:19197::"Up to Date","Not up to Date"} Diabetic Education: {Blank single:19197::"Completed","Not Completed"} Pneumovax: {Blank single:19197::"Up to Date","Not up to Date","unknown"} Influenza: {Blank single:19197::"Up to Date","Not up to Date","unknown"} Aspirin: {Blank single:19197::"yes","no"}  HYPERTENSION / HYPERLIPIDEMIA Satisfied with current treatment? {Blank single:19197::"yes","no"} Duration of hypertension: {Blank single:19197::"chronic","months","years"} BP monitoring frequency: {Blank single:19197::"not checking","rarely","daily","weekly","monthly","a few times a day","a few times a week","a few times a month"} BP range:  BP medication side effects: {Blank single:19197::"yes","no"} Past BP meds: {Blank multiple:19196::"none","amlodipine","amlodipine/benazepril","atenolol","benazepril","benazepril/HCTZ","bisoprolol (bystolic)","carvedilol","chlorthalidone","clonidine","diltiazem","exforge HCT","HCTZ","irbesartan (avapro)","labetalol","lisinopril","lisinopril-HCTZ","losartan  (cozaar)","methyldopa","nifedipine","olmesartan (benicar)","olmesartan-HCTZ","quinapril","ramipril","spironalactone","tekturna","valsartan","valsartan-HCTZ","verapamil"} Duration of hyperlipidemia: {Blank single:19197::"chronic","months","years"} Cholesterol medication side effects: {Blank single:19197::"yes","no"} Cholesterol supplements: {Blank multiple:19196::"none","fish oil","niacin","red yeast rice"} Past cholesterol medications: {Blank multiple:19196::"none","atorvastain (lipitor)","lovastatin (mevacor)","pravastatin (pravachol)","rosuvastatin (crestor)","simvastatin (zocor)","vytorin","fenofibrate (tricor)","gemfibrozil","ezetimide (zetia)","niaspan","lovaza"} Medication compliance: {Blank single:19197::"excellent compliance","good compliance","fair compliance","poor compliance"} Aspirin: {Blank single:19197::"yes","no"} Recent stressors: {Blank single:19197::"yes","no"} Recurrent headaches: {Blank single:19197::"yes","no"} Visual changes: {Blank single:19197::"yes","no"} Palpitations: {Blank single:19197::"yes","no"} Dyspnea: {Blank single:19197::"yes","no"} Chest pain: {Blank single:19197::"yes","no"} Lower extremity edema: {Blank single:19197::"yes","no"} Dizzy/lightheaded: {Blank single:19197::"yes","no"}   She currently lives with: Menopausal Symptoms: {Blank single:19197::"yes","no"}  Depression Screen done today and results listed below:     06/22/2021    8:11 AM 04/11/2021    8:26 AM 02/09/2021    8:51 AM 04/20/2020    8:14 AM 04/15/2019    8:41 AM  Depression screen PHQ 2/9  Decreased Interest 0 0 0 0 0  Down, Depressed, Hopeless 0 0 0 0 0  PHQ - 2 Score 0 0 0 0 0  Altered sleeping 0 0 0  0  Tired, decreased energy 1 0 1  0  Change in appetite 0 0 0  0  Feeling bad or failure about yourself  0 0 0  0  Trouble concentrating 0 0 0  0  Moving slowly or fidgety/restless 0 0 0  0  Suicidal thoughts 0 0 0  0  PHQ-9 Score 1 0 1  0  Difficult doing work/chores Not difficult  at all    Not difficult at all    Past Medical History:  Past Medical History:  Diagnosis Date   Cataract    Diabetes mellitus without complication (HCC)    Hypertension    Neuromuscular disorder (HCC)     Surgical History:  Past Surgical History:  Procedure Laterality Date   catarac surgery Left 01/07/2013   CHOLECYSTECTOMY     EYE SURGERY Right 06/2017   Cataract    Medications:  Current Outpatient Medications on File Prior to Visit  Medication Sig   ASPIRIN LOW DOSE 81 MG EC tablet TAKE 1 TABLET(81 MG) BY MOUTH DAILY   DULoxetine (CYMBALTA) 20 MG capsule TAKE 1 CAPSULE(20 MG) BY MOUTH DAILY   glucose blood (CONTOUR NEXT TEST) test strip CHECK  BLOOD SUGAR DAILY   hydrochlorothiazide (HYDRODIURIL) 25 MG tablet TAKE 1 TABLET(25 MG) BY MOUTH DAILY   hydrocortisone (ANUSOL-HC) 2.5 % rectal cream Place 1 application rectally 2 (two) times daily.   Insulin Pen Needle (PEN NEEDLES) 30G X 5 MM MISC 1 each by Does not apply route once a week.   levocetirizine (XYZAL) 5 MG tablet TAKE 1 TABLET(5 MG) BY MOUTH EVERY EVENING   losartan (COZAAR) 50 MG tablet Take 1 tablet (50 mg total) by mouth daily.   metFORMIN (GLUCOPHAGE) 500 MG tablet TAKE 2 TABLETS(1000 MG) BY MOUTH TWICE DAILY WITH A MEAL   Multiple Vitamin (MULTI-VITAMINS) TABS Take by mouth.   pravastatin (PRAVACHOL) 20 MG tablet Take 1 tablet (20 mg total) by mouth daily.   tirzepatide (MOUNJARO) 10 MG/0.5ML Pen Inject 10 mg into the skin once a week.   tirzepatide (MOUNJARO) 10 MG/0.5ML Pen Inject 10 mg into the skin once a week.   triamcinolone ointment (KENALOG) 0.5 % Apply 1 application 2 (two) times daily topically.   No current facility-administered medications on file prior to visit.    Allergies:  No Known Allergies  Social History:  Social History   Socioeconomic History   Marital status: Married    Spouse name: Not on file   Number of children: Not on file   Years of education: Not on file   Highest  education level: Not on file  Occupational History   Not on file  Tobacco Use   Smoking status: Never   Smokeless tobacco: Never  Vaping Use   Vaping Use: Never used  Substance and Sexual Activity   Alcohol use: Yes    Comment: Occasional glass of wine   Drug use: No   Sexual activity: Yes  Other Topics Concern   Not on file  Social History Narrative   Not on file   Social Determinants of Health   Financial Resource Strain: Not on file  Food Insecurity: Not on file  Transportation Needs: Not on file  Physical Activity: Not on file  Stress: Not on file  Social Connections: Not on file  Intimate Partner Violence: Not on file   Social History   Tobacco Use  Smoking Status Never  Smokeless Tobacco Never   Social History   Substance and Sexual Activity  Alcohol Use Yes   Comment: Occasional glass of wine    Family History:  Family History  Adopted: Yes  Problem Relation Age of Onset   Lung cancer Mother     Past medical history, surgical history, medications, allergies, family history and social history reviewed with patient today and changes made to appropriate areas of the chart.   Review of Systems  Constitutional: Negative.   Skin: Negative.    All other ROS negative except what is listed above and in the HPI.      Objective:    BP 116/73   Pulse 78   Temp 97.6 F (36.4 C)   Wt 177 lb 6.4 oz (80.5 kg)   SpO2 98%   BMI 31.42 kg/m   Wt Readings from Last 3 Encounters:  06/22/21 177 lb 6.4 oz (80.5 kg)  04/11/21 178 lb 12.8 oz (81.1 kg)  02/09/21 179 lb 6.4 oz (81.4 kg)    Physical Exam  Results for orders placed or performed in visit on 02/09/21  HM DIABETES EYE EXAM  Result Value Ref Range   HM Diabetic Eye Exam No Retinopathy No Retinopathy      Assessment &  Plan:   Problem List Items Addressed This Visit       Endocrine   Type 2 diabetes mellitus (HCC) - Primary   Relevant Orders   Bayer DCA Hb A1c Waived   Microalbumin, Urine  Waived   Other Visit Diagnoses     Routine general medical examination at a health care facility       Relevant Orders   Bayer DCA Hb A1c Waived   CBC with Differential/Platelet   Comprehensive metabolic panel   Lipid Panel w/o Chol/HDL Ratio   Urinalysis, Routine w reflex microscopic   TSH   Microalbumin, Urine Waived        Follow up plan: No follow-ups on file.   LABORATORY TESTING:  - Pap smear: would like to do in 6 months  IMMUNIZATIONS:   - Tdap: Tetanus vaccination status reviewed: {tetanus status:315746}. - Influenza: {Blank single:19197::"Up to date","Administered today","Postponed to flu season","Refused","Given elsewhere"} - Pneumovax: {Blank single:19197::"Up to date","Administered today","Not applicable","Refused","Given elsewhere"} - Prevnar: {Blank single:19197::"Up to date","Administered today","Not applicable","Refused","Given elsewhere"} - COVID: {Blank single:19197::"Up to date","Administered today","Not applicable","Refused","Given elsewhere"} - HPV: {Blank single:19197::"Up to date","Administered today","Not applicable","Refused","Given elsewhere"} - Shingrix vaccine: {Blank single:19197::"Up to date","Administered today","Not applicable","Refused","Given elsewhere"}  SCREENING: -Mammogram: {Blank single:19197::"Up to date","Ordered today","Not applicable","Refused","Done elsewhere"}  - Colonoscopy: {Blank single:19197::"Up to date","Ordered today","Not applicable","Refused","Done elsewhere"}  - Bone Density: {Blank single:19197::"Up to date","Ordered today","Not applicable","Refused","Done elsewhere"}  -Hearing Test: {Blank single:19197::"Up to date","Ordered today","Not applicable","Refused","Done elsewhere"}  -Spirometry: {Blank single:19197::"Up to date","Ordered today","Not applicable","Refused","Done elsewhere"}   PATIENT COUNSELING:   Advised to take 1 mg of folate supplement per day if capable of pregnancy.   Sexuality: Discussed sexually  transmitted diseases, partner selection, use of condoms, avoidance of unintended pregnancy  and contraceptive alternatives.   Advised to avoid cigarette smoking.  I discussed with the patient that most people either abstain from alcohol or drink within safe limits (<=14/week and <=4 drinks/occasion for males, <=7/weeks and <= 3 drinks/occasion for females) and that the risk for alcohol disorders and other health effects rises proportionally with the number of drinks per week and how often a drinker exceeds daily limits.  Discussed cessation/primary prevention of drug use and availability of treatment for abuse.   Diet: Encouraged to adjust caloric intake to maintain  or achieve ideal body weight, to reduce intake of dietary saturated fat and total fat, to limit sodium intake by avoiding high sodium foods and not adding table salt, and to maintain adequate dietary potassium and calcium preferably from fresh fruits, vegetables, and low-fat dairy products.    stressed the importance of regular exercise  Injury prevention: Discussed safety belts, safety helmets, smoke detector, smoking near bedding or upholstery.   Dental health: Discussed importance of regular tooth brushing, flossing, and dental visits.    NEXT PREVENTATIVE PHYSICAL DUE IN 1 YEAR. No follow-ups on file.

## 2021-06-23 LAB — COMPREHENSIVE METABOLIC PANEL
ALT: 25 IU/L (ref 0–32)
AST: 19 IU/L (ref 0–40)
Albumin/Globulin Ratio: 1.2 (ref 1.2–2.2)
Albumin: 4.1 g/dL (ref 3.8–4.9)
Alkaline Phosphatase: 84 IU/L (ref 44–121)
BUN/Creatinine Ratio: 18 (ref 9–23)
BUN: 16 mg/dL (ref 6–24)
Bilirubin Total: 0.3 mg/dL (ref 0.0–1.2)
CO2: 22 mmol/L (ref 20–29)
Calcium: 9.5 mg/dL (ref 8.7–10.2)
Chloride: 99 mmol/L (ref 96–106)
Creatinine, Ser: 0.88 mg/dL (ref 0.57–1.00)
Globulin, Total: 3.3 g/dL (ref 1.5–4.5)
Glucose: 257 mg/dL — ABNORMAL HIGH (ref 70–99)
Potassium: 3.9 mmol/L (ref 3.5–5.2)
Sodium: 140 mmol/L (ref 134–144)
Total Protein: 7.4 g/dL (ref 6.0–8.5)
eGFR: 76 mL/min/{1.73_m2} (ref >59)

## 2021-06-23 LAB — CBC WITH DIFFERENTIAL/PLATELET
Basophils Absolute: 0.1 10*3/uL (ref 0.0–0.2)
Basos: 1 %
EOS (ABSOLUTE): 0.4 10*3/uL (ref 0.0–0.4)
Eos: 5 %
Hematocrit: 42.3 % (ref 34.0–46.6)
Hemoglobin: 14.2 g/dL (ref 11.1–15.9)
Immature Grans (Abs): 0 10*3/uL (ref 0.0–0.1)
Immature Granulocytes: 0 %
Lymphocytes Absolute: 1.9 10*3/uL (ref 0.7–3.1)
Lymphs: 27 %
MCH: 30.6 pg (ref 26.6–33.0)
MCHC: 33.6 g/dL (ref 31.5–35.7)
MCV: 91 fL (ref 79–97)
Monocytes Absolute: 0.5 10*3/uL (ref 0.1–0.9)
Monocytes: 7 %
Neutrophils Absolute: 4.1 10*3/uL (ref 1.4–7.0)
Neutrophils: 60 %
Platelets: 289 10*3/uL (ref 150–450)
RBC: 4.64 x10E6/uL (ref 3.77–5.28)
RDW: 12.6 % (ref 11.7–15.4)
WBC: 6.9 10*3/uL (ref 3.4–10.8)

## 2021-06-23 LAB — TSH: TSH: 1.67 u[IU]/mL (ref 0.450–4.500)

## 2021-06-23 LAB — LIPID PANEL W/O CHOL/HDL RATIO
Cholesterol, Total: 170 mg/dL (ref 100–199)
HDL: 29 mg/dL — ABNORMAL LOW (ref >39)
LDL Chol Calc (NIH): 77 mg/dL (ref 0–99)
Triglycerides: 402 mg/dL — ABNORMAL HIGH (ref 0–149)
VLDL Cholesterol Cal: 64 mg/dL — ABNORMAL HIGH (ref 5–40)

## 2021-06-25 ENCOUNTER — Other Ambulatory Visit: Payer: Self-pay | Admitting: Family Medicine

## 2021-06-25 MED ORDER — HYDROCHLOROTHIAZIDE 25 MG PO TABS: ORAL_TABLET | ORAL | 1 refills | Status: DC

## 2021-06-25 MED ORDER — TIRZEPATIDE 12.5 MG/0.5ML ~~LOC~~ SOAJ: 12.5000 mg | SUBCUTANEOUS | 2 refills | Status: DC

## 2021-06-25 MED ORDER — LOSARTAN POTASSIUM 50 MG PO TABS
50.0000 mg | ORAL_TABLET | Freq: Every day | ORAL | 1 refills | Status: DC
Start: 2021-06-25 — End: 2021-09-24

## 2021-06-25 MED ORDER — PRAVASTATIN SODIUM 20 MG PO TABS: 20.0000 mg | ORAL_TABLET | Freq: Every day | ORAL | 1 refills | Status: DC

## 2021-06-25 MED ORDER — ASPIRIN 81 MG PO TBEC
DELAYED_RELEASE_TABLET | ORAL | 3 refills | Status: DC
Start: 2021-06-25 — End: 2022-07-22

## 2021-06-25 MED ORDER — METFORMIN HCL 500 MG PO TABS: ORAL_TABLET | ORAL | 0 refills | Status: DC

## 2021-06-25 MED ORDER — ESOMEPRAZOLE MAGNESIUM 40 MG PO CPDR: 40.0000 mg | DELAYED_RELEASE_CAPSULE | Freq: Every day | ORAL | 3 refills | Status: DC

## 2021-06-25 MED ORDER — DULOXETINE HCL 20 MG PO CPEP: ORAL_CAPSULE | ORAL | 1 refills | Status: DC

## 2021-06-25 NOTE — Assessment & Plan Note (Signed)
Rechecking labs today. Likely will increase her mounjaro to 12.5mg . Continue current regimen. Call with any concerns. Refills given today.

## 2021-06-25 NOTE — Assessment & Plan Note (Signed)
Under good control on current regimen. Continue current regimen. Continue to monitor. Call with any concerns. Refills given. Labs drawn today.   

## 2021-07-29 ENCOUNTER — Other Ambulatory Visit: Payer: Self-pay | Admitting: Family Medicine

## 2021-07-31 ENCOUNTER — Encounter: Payer: 59 | Admitting: Family Medicine

## 2021-07-31 NOTE — Telephone Encounter (Signed)
Requested medication (s) are due for refill today: yes  Requested medication (s) are on the active medication list: yes  Last refill:  05/28/21  Future visit scheduled: yes  Notes to clinic:  med not assigned a protocol   Requested Prescriptions  Pending Prescriptions Disp Refills   MOUNJARO 10 MG/0.5ML Pen [Pharmacy Med Name: MOUNJARO PEN 10MG /0.5ML] 6 mL 3    Sig: INJECT THE CONTENTS OF ONE PEN  SUBCUTANEOUSLY WEEKLY AS  DIRECTED     Off-Protocol Failed - 07/29/2021  7:57 AM      Failed - Medication not assigned to a protocol, review manually.      Passed - Valid encounter within last 12 months    Recent Outpatient Visits           1 month ago Routine general medical examination at a health care facility   [facility], [facility] P, DO   3 months ago Type 2 diabetes mellitus with other neurologic complication, without long-term current use of insulin (HCC)   [facility] [place], Megan P, DO   5 months ago Type 2 diabetes mellitus with other neurologic complication, without long-term current use of insulin (HCC)   [facility] [place], Megan P, DO   8 months ago Type 2 diabetes mellitus with other neurologic complication, without long-term current use of insulin (HCC)   [facility] [place], Megan P, DO   8 months ago Viral upper respiratory tract infection   [facility], NP       Future Appointments             In 4 weeks Larae Grooms, Laural Benes, DO [place], [facility]   In 1 month [place], [place], DO [place], [facility]

## 2021-08-14 ENCOUNTER — Other Ambulatory Visit: Payer: Self-pay | Admitting: Family Medicine

## 2021-08-14 NOTE — Telephone Encounter (Signed)
Requested medication (s) are due for refill today - no  Requested medication (s) are on the active medication list -yes  Future visit scheduled -yes  Last refill: 12.5/0.5 ml dosing 6/19 58ml 2RF  Notes to clinic: dose requested may not be current-medication not assigned protocol  Requested Prescriptions  Pending Prescriptions Disp Refills   MOUNJARO 10 MG/0.5ML Pen [Pharmacy Med Name: MOUNJARO PEN 10MG /0.5ML] 6 mL 3    Sig: INJECT THE CONTENTS OF ONE PEN  SUBCUTANEOUSLY WEEKLY AS  DIRECTED     Off-Protocol Failed - 08/14/2021  9:00 AM      Failed - Medication not assigned to a protocol, review manually.      Passed - Valid encounter within last 12 months    Recent Outpatient Visits           1 month ago Routine general medical examination at a health care facility   [facility], [facility] P, DO   4 months ago Type 2 diabetes mellitus with other neurologic complication, without long-term current use of insulin (HCC)   [facility] [place], Megan P, DO   6 months ago Type 2 diabetes mellitus with other neurologic complication, without long-term current use of insulin (HCC)   [facility] [place], Megan P, DO   8 months ago Type 2 diabetes mellitus with other neurologic complication, without long-term current use of insulin (HCC)   [facility] [place], Megan P, DO   9 months ago Viral upper respiratory tract infection   [facility], NP       Future Appointments             In 2 weeks Larae Grooms, Laural Benes, DO [facility], [facility]   In 1 month [employer], Megan P, DO 04-17-1985, [facility]               Requested Prescriptions  Pending Prescriptions Disp Refills   MOUNJARO 10 MG/0.5ML Pen [employer] Med Name: MOUNJARO PEN 10MG /0.5ML] 6 mL 3    Sig: INJECT THE CONTENTS OF ONE PEN  SUBCUTANEOUSLY WEEKLY AS  DIRECTED     Off-Protocol Failed - 08/14/2021  9:00 AM      Failed -  Medication not assigned to a protocol, review manually.      Passed - Valid encounter within last 12 months    Recent Outpatient Visits           1 month ago Routine general medical examination at a health care facility   [facility], 10/14/2021 P, DO   4 months ago Type 2 diabetes mellitus with other neurologic complication, without long-term current use of insulin (HCC)   [facility] [place], Megan P, DO   6 months ago Type 2 diabetes mellitus with other neurologic complication, without long-term current use of insulin (HCC)   [facility] [place], Megan P, DO   8 months ago Type 2 diabetes mellitus with other neurologic complication, without long-term current use of insulin (HCC)   [facility] [place], Megan P, DO   9 months ago Viral upper respiratory tract infection   [facility] [place], NP       Future Appointments             In 2 weeks [facility], Larae Grooms, DO Laural Benes, [facility]   In 1 month [place], [employer], DO [place], [facility]

## 2021-08-15 ENCOUNTER — Encounter: Payer: Self-pay | Admitting: Family Medicine

## 2021-08-15 MED ORDER — TIRZEPATIDE 12.5 MG/0.5ML ~~LOC~~ SOAJ: 12.5000 mg | SUBCUTANEOUS | 2 refills | Status: DC

## 2021-08-16 ENCOUNTER — Other Ambulatory Visit: Payer: Self-pay

## 2021-08-16 MED ORDER — TIRZEPATIDE 12.5 MG/0.5ML ~~LOC~~ SOAJ
12.5000 mg | SUBCUTANEOUS | 2 refills | Status: DC
Start: 2021-08-16 — End: 2021-12-24

## 2021-08-22 ENCOUNTER — Telehealth: Payer: Self-pay | Admitting: Family Medicine

## 2021-08-22 NOTE — Telephone Encounter (Signed)
Pt brought in Health Screening papers for employment to be filled out by Physician.  Will be put in folder for physician.  Call pt when able to be picked up.

## 2021-08-23 NOTE — Telephone Encounter (Signed)
Form has been completed, per patient OK to turn in by fax.

## 2021-08-26 ENCOUNTER — Other Ambulatory Visit: Payer: Self-pay | Admitting: Family Medicine

## 2021-08-28 NOTE — Telephone Encounter (Signed)
Requested medication (s) are due for refill today: no  Requested medication (s) are on the active medication list: yes  Last refill:  06/25/21 #360  Future visit scheduled: yes on 09/24/21  Notes to clinic:  not refilling: pt has OV day before runs out. Please review to ensure ok not to refill   Requested Prescriptions  Pending Prescriptions Disp Refills   metFORMIN (GLUCOPHAGE) 500 MG tablet [Pharmacy Med Name: metFORMIN HCl 500 MG Oral Tablet] 360 tablet 3    Sig: TAKE 2 TABLETS BY MOUTH TWICE  DAILY WITH A MEAL     Endocrinology:  Diabetes - Biguanides Failed - 08/26/2021 10:07 AM      Failed - HBA1C is between 0 and 7.9 and within 180 days    Hemoglobin A1C  Date Value Ref Range Status  11/15/2015 7.4  Final   HB A1C (BAYER DCA - WAIVED)  Date Value Ref Range Status  06/22/2021 9.6 (H) 4.8 - 5.6 % Final    Comment:             Prediabetes: 5.7 - 6.4          Diabetes: >6.4          Glycemic control for adults with diabetes: <7.0          Failed - B12 Level in normal range and within 720 days    No results found for: "VITAMINB12"       Passed - Cr in normal range and within 360 days    Creatinine, Ser  Date Value Ref Range Status  06/22/2021 0.88 0.57 - 1.00 mg/dL Final         Passed - eGFR in normal range and within 360 days    GFR calc Af Amer  Date Value Ref Range Status  10/20/2019 113 >59 mL/min/1.73 Final    Comment:    **In accordance with recommendations from the NKF-ASN Task force,**   [facility] is in the process of updating its eGFR calculation to the   2021 CKD-EPI creatinine equation that estimates kidney function   without a race variable.    GFR calc non Af Amer  Date Value Ref Range Status  10/20/2019 98 >59 mL/min/1.73 Final   eGFR  Date Value Ref Range Status  06/22/2021 76 >59 mL/min/1.73 Final         Passed - Valid encounter within last 6 months    Recent Outpatient Visits           2 months ago Routine general medical examination  at a health care facility   [facility] [place] [place] P, DO   4 months ago Type 2 diabetes mellitus with other neurologic complication, without long-term current use of insulin ([place])   [place], Megan P, DO   6 months ago Type 2 diabetes mellitus with other neurologic complication, without long-term current use of insulin ([place])   [facility], Megan P, DO   9 months ago Type 2 diabetes mellitus with other neurologic complication, without long-term current use of insulin ([place])   [place], Megan P, DO   9 months ago Viral upper respiratory tract infection   [facility] [place], NP       Future Appointments             In 3 weeks Wynetta Emery, Barb Merino, DO [place], [facility]            Passed - CBC  within normal limits and completed in the last 12 months    WBC  Date Value Ref Range Status  06/22/2021 6.9 3.4 - 10.8 x10E3/uL Final   RBC  Date Value Ref Range Status  06/22/2021 4.64 3.77 - 5.28 x10E6/uL Final   Hemoglobin  Date Value Ref Range Status  06/22/2021 14.2 11.1 - 15.9 g/dL Final   Hematocrit  Date Value Ref Range Status  06/22/2021 42.3 34.0 - 46.6 % Final   MCHC  Date Value Ref Range Status  06/22/2021 33.6 31.5 - 35.7 g/dL Final   [facility]  Date Value Ref Range Status  06/22/2021 30.6 26.6 - 33.0 pg Final   MCV  Date Value Ref Range Status  06/22/2021 91 79 - 97 fL Final   No results found for: "PLTCOUNTKUC", "LABPLAT", "POCPLA" RDW  Date Value Ref Range Status  06/22/2021 12.6 11.7 - 15.4 % Final

## 2021-08-29 ENCOUNTER — Encounter: Payer: 59 | Admitting: Family Medicine

## 2021-09-24 ENCOUNTER — Ambulatory Visit: Payer: 59 | Admitting: Family Medicine

## 2021-09-24 ENCOUNTER — Encounter: Payer: Self-pay | Admitting: Family Medicine

## 2021-09-24 VITALS — BP 106/70 | HR 80 | Temp 98.0°F | Ht 63.0 in | Wt 174.0 lb

## 2021-09-24 DIAGNOSIS — E1149 Type 2 diabetes mellitus with other diabetic neurological complication: Secondary | ICD-10-CM

## 2021-09-24 DIAGNOSIS — I10 Essential (primary) hypertension: Secondary | ICD-10-CM | POA: Diagnosis not present

## 2021-09-24 LAB — MICROALBUMIN, URINE WAIVED
Creatinine, Urine Waived: 100 mg/dL (ref 10–300)
Microalb, Ur Waived: 30 mg/L — ABNORMAL HIGH (ref 0–19)

## 2021-09-24 LAB — BAYER DCA HB A1C WAIVED: HB A1C (BAYER DCA - WAIVED): 8.6 % — ABNORMAL HIGH (ref 4.8–5.6)

## 2021-09-24 MED ORDER — LOSARTAN POTASSIUM 25 MG PO TABS
25.0000 mg | ORAL_TABLET | Freq: Every day | ORAL | 1 refills | Status: DC
Start: 2021-09-24 — End: 2022-02-07

## 2021-09-24 NOTE — Assessment & Plan Note (Signed)
Doing great, will cut her losartan to 25mg . Recheck 3 months.

## 2021-09-24 NOTE — Assessment & Plan Note (Signed)
Improved to 8.6- only 1 month on 12.5 mounjaro. Will stay at current dose and recheck 3 months.

## 2021-09-24 NOTE — Progress Notes (Signed)
BP 106/70   Pulse 80   Temp 98 F (36.7 C)   Ht $R'5\' 3"'ie$  (1.6 m)   Wt 174 lb (78.9 kg)   SpO2 99%   BMI 30.82 kg/m    Subjective:    Patient ID: Lori Horne, female    DOB: 1965/05/05, 56 y.o.   MRN: [id number]  HPI: [name] is a 56 y.o. female  Chief Complaint  Patient presents with   Diabetes    Patient states she has not had eye exam this year yet   DIABETES- has been on the 12.5 since August Hypoglycemic episodes:no Polydipsia/polyuria: no Visual disturbance: no Chest pain: no Paresthesias: no Glucose Monitoring: yes  Accucheck frequency: Daily  Fasting glucose: 120-140 Taking Insulin?: no Blood Pressure Monitoring: a few times a week Retinal Examination: Not up to Date Foot Exam: Up to Date Diabetic Education: Not Completed Pneumovax: Up to Date Influenza: Up to Date Aspirin: no  HYPERTENSION  Hypertension status: controlled  Satisfied with current treatment? yes Duration of hypertension: chronic BP monitoring frequency:  a few times a week BP medication side effects:  no Medication compliance: excellent compliance Previous BP meds: losartan, HCTZ Aspirin: no Recurrent headaches: no Visual changes: no Palpitations: no Dyspnea: no Chest pain: no Lower extremity edema: no Dizzy/lightheaded: yes   Relevant past medical, surgical, family and social history reviewed and updated as indicated. Interim medical history since our last visit reviewed. Allergies and medications reviewed and updated.  Review of Systems  Constitutional: Negative.   Respiratory: Negative.    Cardiovascular: Negative.   Gastrointestinal: Negative.   Musculoskeletal: Negative.   Psychiatric/Behavioral: Negative.      Per HPI unless specifically indicated above     Objective:    BP 106/70   Pulse 80   Temp 98 F (36.7 C)   Ht $R'5\' 3"'YW$  (1.6 m)   Wt 174 lb (78.9 kg)   SpO2 99%   BMI 30.82 kg/m   Wt Readings from Last 3 Encounters:  09/24/21 174 lb (78.9 kg)   06/22/21 177 lb 6.4 oz (80.5 kg)  04/11/21 178 lb 12.8 oz (81.1 kg)    Physical Exam Vitals and nursing note reviewed.  Constitutional:      General: She is not in acute distress.    Appearance: Normal appearance. She is not ill-appearing, toxic-appearing or diaphoretic.  HENT:     Head: Normocephalic and atraumatic.     Right Ear: External ear normal.     Left Ear: External ear normal.     Nose: Nose normal.     Mouth/Throat:     Mouth: Mucous membranes are moist.     Pharynx: Oropharynx is clear.  Eyes:     General: No scleral icterus.       Right eye: No discharge.        Left eye: No discharge.     Extraocular Movements: Extraocular movements intact.     Conjunctiva/sclera: Conjunctivae normal.     Pupils: Pupils are equal, round, and reactive to light.  Cardiovascular:     Rate and Rhythm: Normal rate and regular rhythm.     Pulses: Normal pulses.     Heart sounds: Normal heart sounds. No murmur heard.    No friction rub. No gallop.  Pulmonary:     Effort: Pulmonary effort is normal. No respiratory distress.     Breath sounds: Normal breath sounds. No stridor. No wheezing, rhonchi or rales.  Chest:     Chest wall: No  tenderness.  Musculoskeletal:        General: Normal range of motion.     Cervical back: Normal range of motion and neck supple.  Skin:    General: Skin is warm and dry.     Capillary Refill: Capillary refill takes less than 2 seconds.     Coloration: Skin is not jaundiced or pale.     Findings: No bruising, erythema, lesion or rash.  Neurological:     General: No focal deficit present.     Mental Status: She is alert and oriented to person, place, and time. Mental status is at baseline.  Psychiatric:        Mood and Affect: Mood normal.        Behavior: Behavior normal.        Thought Content: Thought content normal.        Judgment: Judgment normal.     Results for orders placed or performed in visit on 06/22/21  Bayer DCA Hb A1c Waived   Result Value Ref Range   HB A1C (BAYER DCA - WAIVED) 9.6 (H) 4.8 - 5.6 %  CBC with Differential/Platelet  Result Value Ref Range   WBC 6.9 3.4 - 10.8 x10E3/uL   RBC 4.64 3.77 - 5.28 x10E6/uL   Hemoglobin 14.2 11.1 - 15.9 g/dL   Hematocrit 42.3 34.0 - 46.6 %   MCV 91 79 - 97 fL   MCH 30.6 26.6 - 33.0 pg   MCHC 33.6 31.5 - 35.7 g/dL   RDW 12.6 11.7 - 15.4 %   Platelets 289 150 - 450 x10E3/uL   Neutrophils 60 Not Estab. %   Lymphs 27 Not Estab. %   Monocytes 7 Not Estab. %   Eos 5 Not Estab. %   Basos 1 Not Estab. %   Neutrophils Absolute 4.1 1.4 - 7.0 x10E3/uL   Lymphocytes Absolute 1.9 0.7 - 3.1 x10E3/uL   Monocytes Absolute 0.5 0.1 - 0.9 x10E3/uL   EOS (ABSOLUTE) 0.4 0.0 - 0.4 x10E3/uL   Basophils Absolute 0.1 0.0 - 0.2 x10E3/uL   Immature Granulocytes 0 Not Estab. %   Immature Grans (Abs) 0.0 0.0 - 0.1 x10E3/uL  Comprehensive metabolic panel  Result Value Ref Range   Glucose 257 (H) 70 - 99 mg/dL   BUN 16 6 - 24 mg/dL   Creatinine, Ser 0.88 0.57 - 1.00 mg/dL   eGFR 76 >59 mL/min/1.73   BUN/Creatinine Ratio 18 9 - 23   Sodium 140 134 - 144 mmol/L   Potassium 3.9 3.5 - 5.2 mmol/L   Chloride 99 96 - 106 mmol/L   CO2 22 20 - 29 mmol/L   Calcium 9.5 8.7 - 10.2 mg/dL   Total Protein 7.4 6.0 - 8.5 g/dL   Albumin 4.1 3.8 - 4.9 g/dL   Globulin, Total 3.3 1.5 - 4.5 g/dL   Albumin/Globulin Ratio 1.2 1.2 - 2.2   Bilirubin Total 0.3 0.0 - 1.2 mg/dL   Alkaline Phosphatase 84 44 - 121 IU/L   AST 19 0 - 40 IU/L   ALT 25 0 - 32 IU/L  Lipid Panel w/o Chol/HDL Ratio  Result Value Ref Range   Cholesterol, Total 170 100 - 199 mg/dL   Triglycerides 402 (H) 0 - 149 mg/dL   HDL 29 (L) >39 mg/dL   VLDL Cholesterol Cal 64 (H) 5 - 40 mg/dL   LDL Chol Calc (NIH) 77 0 - 99 mg/dL  TSH  Result Value Ref Range   TSH 1.670 0.450 - 4.500  uIU/mL      Assessment & Plan:   Problem List Items Addressed This Visit       Cardiovascular and Mediastinum   Hypertension    Doing great, will  cut her losartan to $RemoveBef'25mg'mIaDsvRUDf$ . Recheck 3 months.       Relevant Medications   losartan (COZAAR) 25 MG tablet     Endocrine   Type 2 diabetes mellitus ([place]) - Primary    Improved to 8.6- only 1 month on 12.5 mounjaro. Will stay at current dose and recheck 3 months.       Relevant Medications   losartan (COZAAR) 25 MG tablet   Other Relevant Orders   Bayer DCA Hb A1c Waived   Microalbumin, Urine Waived     Follow up plan: Return in about 3 months (around 12/24/2021).

## 2021-10-04 ENCOUNTER — Other Ambulatory Visit: Payer: Self-pay | Admitting: Family Medicine

## 2021-10-04 NOTE — Telephone Encounter (Signed)
Requested Prescriptions  Pending Prescriptions Disp Refills  . metFORMIN (GLUCOPHAGE) 500 MG tablet [Pharmacy Med Name: metFORMIN HCl 500 MG Oral Tablet] 360 tablet 0    Sig: TAKE 2 TABLETS BY MOUTH TWICE  DAILY WITH A MEAL     Endocrinology:  Diabetes - Biguanides Failed - 10/04/2021  9:47 AM      Failed - HBA1C is between 0 and 7.9 and within 180 days    Hemoglobin A1C  Date Value Ref Range Status  11/15/2015 7.4  Final   HB A1C (BAYER DCA - WAIVED)  Date Value Ref Range Status  09/24/2021 8.6 (H) 4.8 - 5.6 % Final    Comment:             Prediabetes: 5.7 - 6.4          Diabetes: >6.4          Glycemic control for adults with diabetes: <7.0          Failed - B12 Level in normal range and within 720 days    No results found for: "VITAMINB12"       Passed - Cr in normal range and within 360 days    Creatinine, Ser  Date Value Ref Range Status  06/22/2021 0.88 0.57 - 1.00 mg/dL Final         Passed - eGFR in normal range and within 360 days    GFR calc Af Amer  Date Value Ref Range Status  10/20/2019 113 >59 mL/min/1.73 Final    Comment:    **In accordance with recommendations from the NKF-ASN Task force,**   [facility] is in the process of updating its eGFR calculation to the   2021 CKD-EPI creatinine equation that estimates kidney function   without a race variable.    GFR calc non Af Amer  Date Value Ref Range Status  10/20/2019 98 >59 mL/min/1.73 Final   eGFR  Date Value Ref Range Status  06/22/2021 76 >59 mL/min/1.73 Final         Passed - Valid encounter within last 6 months    Recent Outpatient Visits          1 week ago Type 2 diabetes mellitus with other neurologic complication, without long-term current use of insulin ([place])   [place], Megan P, DO   3 months ago Routine general medical examination at a health care facility   [facility], [place] P, DO   5 months ago Type 2 diabetes mellitus with other neurologic  complication, without long-term current use of insulin ([place])   [facility], Megan P, DO   7 months ago Type 2 diabetes mellitus with other neurologic complication, without long-term current use of insulin ([place])   [facility], Megan P, DO   10 months ago Type 2 diabetes mellitus with other neurologic complication, without long-term current use of insulin ([place])   [place], [place], DO      Future Appointments            In 2 months Johnson, Megan P, DO [place], [facility]           Passed - CBC within normal limits and completed in the last 12 months    WBC  Date Value Ref Range Status  06/22/2021 6.9 3.4 - 10.8 x10E3/uL Final   RBC  Date Value Ref Range Status  06/22/2021 4.64 3.77 - 5.28 x10E6/uL Final   Hemoglobin  Date Value Ref Range Status  06/22/2021 14.2 11.1 - 15.9 g/dL Final   Hematocrit  Date Value Ref Range Status  06/22/2021 42.3 34.0 - 46.6 % Final   MCHC  Date Value Ref Range Status  06/22/2021 33.6 31.5 - 35.7 g/dL Final   [facility]  Date Value Ref Range Status  06/22/2021 30.6 26.6 - 33.0 pg Final   MCV  Date Value Ref Range Status  06/22/2021 91 79 - 97 fL Final   No results found for: "PLTCOUNTKUC", "LABPLAT", "POCPLA" RDW  Date Value Ref Range Status  06/22/2021 12.6 11.7 - 15.4 % Final

## 2021-10-15 LAB — HM DIABETES EYE EXAM

## 2021-10-18 ENCOUNTER — Other Ambulatory Visit: Payer: Self-pay | Admitting: Family Medicine

## 2021-10-18 NOTE — Telephone Encounter (Signed)
Unable to refill per protocol, request is too soon, last RF was 06/25/21 for 90 and 1 RF. Should have enough through Nov.E-Prescribing Status: Receipt confirmed by pharmacy (06/25/2021 12:17 PM EDT). Will refuse.   Requested Prescriptions  Pending Prescriptions Disp Refills  . DULoxetine (CYMBALTA) 20 MG capsule [Pharmacy Med Name: DULoxetine HCl 20 MG Oral Capsule Delayed Release Particles] 90 capsule 3    Sig: TAKE 1 CAPSULE BY MOUTH DAILY     Psychiatry: Antidepressants - SNRI - duloxetine Passed - 10/18/2021  8:43 AM      Passed - Cr in normal range and within 360 days    Creatinine, Ser  Date Value Ref Range Status  06/22/2021 0.88 0.57 - 1.00 mg/dL Final         Passed - eGFR is 30 or above and within 360 days    GFR calc Af Amer  Date Value Ref Range Status  10/20/2019 113 >59 mL/min/1.73 Final    Comment:    **In accordance with recommendations from the NKF-ASN Task force,**   [facility] is in the process of updating its eGFR calculation to the   2021 CKD-EPI creatinine equation that estimates kidney function   without a race variable.    GFR calc non Af Amer  Date Value Ref Range Status  10/20/2019 98 >59 mL/min/1.73 Final   eGFR  Date Value Ref Range Status  06/22/2021 76 >59 mL/min/1.73 Final         Passed - Completed PHQ-2 or PHQ-9 in the last 360 days      Passed - Last BP in normal range    BP Readings from Last 1 Encounters:  09/24/21 106/70         Passed - Valid encounter within last 6 months    Recent Outpatient Visits          3 weeks ago Type 2 diabetes mellitus with other neurologic complication, without long-term current use of insulin ([place])   [place], Megan P, DO   3 months ago Routine general medical examination at a health care facility   [facility], [place] P, DO   6 months ago Type 2 diabetes mellitus with other neurologic complication, without long-term current use of insulin ([place])   [place], Megan P, DO   8 months ago Type 2 diabetes mellitus with other neurologic complication, without long-term current use of insulin ([place])   [place], Megan P, DO   10 months ago Type 2 diabetes mellitus with other neurologic complication, without long-term current use of insulin [facility])   [place], [place], DO      Future Appointments            In 2 months Wynetta Emery, Barb Merino, DO [employer], [facility]

## 2021-10-21 ENCOUNTER — Other Ambulatory Visit: Payer: Self-pay | Admitting: Family Medicine

## 2021-10-22 NOTE — Telephone Encounter (Signed)
Requested Prescriptions  Pending Prescriptions Disp Refills  . hydrochlorothiazide (HYDRODIURIL) 25 MG tablet [Pharmacy Med Name: hydroCHLOROthiazide 25 MG Oral Tablet] 90 tablet 0    Sig: TAKE 1 TABLET BY MOUTH DAILY     Cardiovascular: Diuretics - Thiazide Passed - 10/21/2021 10:27 PM      Passed - Cr in normal range and within 180 days    Creatinine, Ser  Date Value Ref Range Status  06/22/2021 0.88 0.57 - 1.00 mg/dL Final         Passed - K in normal range and within 180 days    Potassium  Date Value Ref Range Status  06/22/2021 3.9 3.5 - 5.2 mmol/L Final         Passed - Na in normal range and within 180 days    Sodium  Date Value Ref Range Status  06/22/2021 140 134 - 144 mmol/L Final         Passed - Last BP in normal range    BP Readings from Last 1 Encounters:  09/24/21 106/70         Passed - Valid encounter within last 6 months    Recent Outpatient Visits          4 weeks ago Type 2 diabetes mellitus with other neurologic complication, without long-term current use of insulin (H[facility])   [facility] [place], Megan P, DO   4 months ago Routine general medical examination at a health care facility   [facility], [place] P, DO   6 months ago Type 2 diabetes mellitus with other neurologic complication, without long-term current use of insulin (H[facility])   [facility] [place], Megan P, DO   8 months ago Type 2 diabetes mellitus with other neurologic complication, without long-term current use of insulin (HCC)   [facility] [place], Megan P, DO   11 months ago Type 2 diabetes mellitus with other neurologic complication, without long-term current use of insulin (HCC)   [facility] [place], [place], DO      Future Appointments            In 2 months Johnson, Oralia Rud, DO [facility], [facility]           . pravastatin (PRAVACHOL) 20 MG tablet [Pharmacy Med Name: Pravastatin Sodium 20 MG Oral  Tablet] 90 tablet 0    Sig: TAKE 1 TABLET BY MOUTH DAILY     Cardiovascular:  Antilipid - Statins Failed - 10/21/2021 10:27 PM      Failed - Lipid Panel in normal range within the last 12 months    Cholesterol, Total  Date Value Ref Range Status  06/22/2021 170 100 - 199 mg/dL Final   Cholesterol [place], Waived  Date Value Ref Range Status  12/08/2017 156 <200 mg/dL Final    Comment:                            Desirable                <200                         Borderline High      200- 239                         High                     >  239    LDL Chol Calc (NIH)  Date Value Ref Range Status  06/22/2021 77 0 - 99 mg/dL Final   HDL  Date Value Ref Range Status  06/22/2021 29 (L) >39 mg/dL Final   Triglycerides  Date Value Ref Range Status  06/22/2021 402 (H) 0 - 149 mg/dL Final   Triglycerides Piccolo,Waived  Date Value Ref Range Status  12/08/2017 308 (H) <150 mg/dL Final    Comment:                            Normal                   <150                         Borderline High     150 - 199                         High                200 - 499                         Very High                >499          Passed - Patient is not pregnant      Passed - Valid encounter within last 12 months    Recent Outpatient Visits          4 weeks ago Type 2 diabetes mellitus with other neurologic complication, without long-term current use of insulin ([place])   [place], Megan P, DO   4 months ago Routine general medical examination at a health care facility   [facility], [place] P, DO   6 months ago Type 2 diabetes mellitus with other neurologic complication, without long-term current use of insulin ([place])   [place], Megan P, DO   8 months ago Type 2 diabetes mellitus with other neurologic complication, without long-term current use of insulin ([place])   [place], Megan P, DO   11  months ago Type 2 diabetes mellitus with other neurologic complication, without long-term current use of insulin ([place])   [place], [place], DO      Future Appointments            In 2 months Johnson, Barb Merino, DO [employer], [facility]

## 2021-10-23 ENCOUNTER — Other Ambulatory Visit: Payer: Self-pay | Admitting: Family Medicine

## 2021-10-23 ENCOUNTER — Encounter: Payer: Self-pay | Admitting: Family Medicine

## 2021-10-23 NOTE — Telephone Encounter (Signed)
Requested Prescriptions  Pending Prescriptions Disp Refills  . DULoxetine (CYMBALTA) 20 MG capsule [Pharmacy Med Name: DULoxetine HCl 20 MG Oral Capsule Delayed Release Particles] 90 capsule 3    Sig: TAKE 1 CAPSULE BY MOUTH DAILY     Psychiatry: Antidepressants - SNRI - duloxetine Passed - 10/23/2021  3:34 PM      Passed - Cr in normal range and within 360 days    Creatinine, Ser  Date Value Ref Range Status  06/22/2021 0.88 0.57 - 1.00 mg/dL Final         Passed - eGFR is 30 or above and within 360 days    GFR calc Af Amer  Date Value Ref Range Status  10/20/2019 113 >59 mL/min/1.73 Final    Comment:    **In accordance with recommendations from the NKF-ASN Task force,**   [facility] is in the process of updating its eGFR calculation to the   2021 CKD-EPI creatinine equation that estimates kidney function   without a race variable.    GFR calc non Af Amer  Date Value Ref Range Status  10/20/2019 98 >59 mL/min/1.73 Final   eGFR  Date Value Ref Range Status  06/22/2021 76 >59 mL/min/1.73 Final         Passed - Completed PHQ-2 or PHQ-9 in the last 360 days      Passed - Last BP in normal range    BP Readings from Last 1 Encounters:  09/24/21 106/70         Passed - Valid encounter within last 6 months    Recent Outpatient Visits          4 weeks ago Type 2 diabetes mellitus with other neurologic complication, without long-term current use of insulin ([place])   [place], Megan P, DO   4 months ago Routine general medical examination at a health care facility   [facility], [place] P, DO   6 months ago Type 2 diabetes mellitus with other neurologic complication, without long-term current use of insulin ([place])   [place], Megan P, DO   8 months ago Type 2 diabetes mellitus with other neurologic complication, without long-term current use of insulin ([place])   [facility], Megan P, DO   11  months ago Type 2 diabetes mellitus with other neurologic complication, without long-term current use of insulin [facility])   [place], [place], DO      Future Appointments            In 2 months Wynetta Emery, Barb Merino, DO [employer], [facility]

## 2021-10-24 ENCOUNTER — Other Ambulatory Visit: Payer: Self-pay

## 2021-10-24 MED ORDER — DULOXETINE HCL 20 MG PO CPEP: ORAL_CAPSULE | ORAL | 1 refills | Status: DC

## 2021-12-01 ENCOUNTER — Telehealth: Payer: 59 | Admitting: Physician Assistant

## 2021-12-01 DIAGNOSIS — R197 Diarrhea, unspecified: Secondary | ICD-10-CM | POA: Diagnosis not present

## 2021-12-01 MED ORDER — LOPERAMIDE HCL 2 MG PO TABS: 2.0000 mg | ORAL_TABLET | Freq: Four times a day (QID) | ORAL | 0 refills | Status: AC | PRN

## 2021-12-01 MED ORDER — DICYCLOMINE HCL 10 MG PO CAPS: 10.0000 mg | ORAL_CAPSULE | Freq: Three times a day (TID) | ORAL | 0 refills | Status: DC

## 2021-12-01 NOTE — Progress Notes (Signed)
Virtual Visit Consent   Lori Horne, you are scheduled for a virtual visit with a [facility] provider today. Just as with appointments in the office, your consent must be obtained to participate. Your consent will be active for this visit and any virtual visit you may have with one of our providers in the next 365 days. If you have a MyChart account, a copy of this consent can be sent to you electronically.  As this is a virtual visit, video technology does not allow for your provider to perform a traditional examination. This may limit your provider's ability to fully assess your condition. If your provider identifies any concerns that need to be evaluated in person or the need to arrange testing (such as labs, EKG, etc.), we will make arrangements to do so. Although advances in technology are sophisticated, we cannot ensure that it will always work on either your end or our end. If the connection with a video visit is poor, the visit may have to be switched to a telephone visit. With either a video or telephone visit, we are not always able to ensure that we have a secure connection.  By engaging in this virtual visit, you consent to the provision of healthcare and authorize for your insurance to be billed (if applicable) for the services provided during this visit. Depending on your insurance coverage, you may receive a charge related to this service.  I need to obtain your verbal consent now. Are you willing to proceed with your visit today? Lori Horne has provided verbal consent on 12/01/2021 for a virtual visit (video or telephone). [name], PA-C  Date: 12/01/2021 11:02 AM  Virtual Visit via Video Note   I, [name], connected with  Lori Horne  ([id number], [birth year]) on 12/01/21 at 11:00 AM EST by a video-enabled telemedicine application and verified that I am speaking with the correct person using two identifiers.  Location: Patient: Virtual Visit Location  Patient: Home Provider: Virtual Visit Location Provider: Home Office   I discussed the limitations of evaluation and management by telemedicine and the availability of in person appointments. The patient expressed understanding and agreed to proceed.    History of Present Illness: Lori Horne is a 56 y.o. who identifies as a female who was assigned female at birth, and is being seen today for diarrhea.  HPI: Diarrhea  This is a new problem. The current episode started in the past 7 days (Thursday). The problem occurs 2 to 4 times per day. The problem has been gradually worsening. The stool consistency is described as Watery. The patient states that diarrhea awakens her from sleep. Associated symptoms include abdominal pain and increased flatus (burping). Pertinent negatives include no bloating, chills, fever, sweats or vomiting. Nothing (any eating or drinking) aggravates the symptoms. She has tried increased fluids for the symptoms. The treatment provided no relief.    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) 06/02/2015   Vasomotor symptoms due to menopause 06/02/2015   PSC (posterior subcapsular cataract), left 03/30/2013   Hypertension 02/11/2013   Type 2 diabetes mellitus (HCC) 12/09/2012   History of Helicobacter pylori infection 01/08/2012    Allergies: No Known Allergies Medications:  Current Outpatient Medications:    aspirin  EC (ASPIRIN LOW DOSE) 81 MG tablet, TAKE 1 TABLET(81 MG) BY MOUTH DAILY, Disp: 90 tablet, Rfl: 3   dicyclomine (BENTYL) 10 MG capsule, Take 1 capsule (10 mg total) by mouth 4 (four) times daily -  before meals and at bedtime., Disp: 40 capsule, Rfl: 0   DULoxetine (CYMBALTA) 20 MG  capsule, TAKE 1 CAPSULE(20 MG) BY MOUTH DAILY, Disp: 90 capsule, Rfl: 1   esomeprazole (NEXIUM) 40 MG capsule, Take 1 capsule (40 mg total) by mouth daily., Disp: 90 capsule, Rfl: 3   glucose blood (CONTOUR NEXT TEST) test strip, CHECK BLOOD SUGAR DAILY, Disp: 100 strip, Rfl: 12   hydrochlorothiazide (HYDRODIURIL) 25 MG tablet, TAKE 1 TABLET BY MOUTH DAILY, Disp: 90 tablet, Rfl: 0   hydrocortisone (ANUSOL-HC) 2.5 % rectal cream, Place 1 application rectally 2 (two) times daily., Disp: 30 g, Rfl: 3   Insulin Pen Needle (PEN NEEDLES) 30G X 5 MM MISC, 1 each by Does not apply route once a week., Disp: 100 each, Rfl: 12   levocetirizine (XYZAL) 5 MG tablet, TAKE 1 TABLET(5 MG) BY MOUTH EVERY EVENING, Disp: 90 tablet, Rfl: 0   loperamide (IMODIUM A-D) 2 MG tablet, Take 1 tablet (2 mg total) by mouth 4 (four) times daily as needed for diarrhea or loose stools., Disp: 30 tablet, Rfl: 0   losartan (COZAAR) 25 MG tablet, Take 1 tablet (25 mg total) by mouth daily., Disp: 90 tablet, Rfl: 1   metFORMIN (GLUCOPHAGE) 500 MG tablet, TAKE 2 TABLETS BY MOUTH TWICE  DAILY WITH A MEAL, Disp: 360 tablet, Rfl: 0   Multiple Vitamin (MULTI-VITAMINS) TABS, Take by mouth., Disp: , Rfl:    pravastatin (PRAVACHOL) 20 MG tablet, TAKE 1 TABLET BY MOUTH DAILY, Disp: 90 tablet, Rfl: 0   tirzepatide (MOUNJARO) 12.5 MG/0.5ML Pen, Inject 12.5 mg into the skin once a week., Disp: 6 mL, Rfl: 2   triamcinolone ointment (KENALOG) 0.5 %, Apply 1 application 2 (two) times daily topically., Disp: 30 g, Rfl: 1   Observations/Objective: Patient is well-developed, well-nourished in no acute distress.  Resting comfortably at home.  Head is normocephalic, atraumatic.  No labored breathing.  Speech is clear and coherent with logical content.  Patient is alert and oriented at baseline.    Assessment and Plan: 1. Diarrhea, unspecified type - dicyclomine (BENTYL) 10 MG capsule; Take 1 capsule (10 mg total) by mouth 4 (four) times daily -   before meals and at bedtime.  Dispense: 40 capsule; Refill: 0 - loperamide (IMODIUM A-D) 2 MG tablet; Take 1 tablet (2 mg total) by mouth 4 (four) times daily as needed for diarrhea or loose stools.  Dispense: 30 tablet; Refill: 0  - Imodium for diarrhea - Bentyl for cramping - Push fluids; electrolyte beverages - Increase dietary fibers - Bland foods for 24-48 hours until diarrhea improves - Seek in person evaluation if worsening   Follow Up Instructions: I discussed the assessment and treatment plan with the patient. The patient was provided an opportunity to ask questions and all were answered. The patient agreed with the plan and demonstrated an understanding of the instructions.  A copy of instructions were sent to the patient via MyChart unless otherwise noted below.    The patient was advised to call back or seek an in-person evaluation if the symptoms worsen or if the condition fails to improve as anticipated.  Time:  I spent 11 minutes with the patient via telehealth technology discussing the above problems/concerns.    Alessandra Bevels  Arend Bahl, PA-C

## 2021-12-01 NOTE — Patient Instructions (Signed)
Lori Horne, thank you for joining Lori Loveless, PA-C for today's virtual visit.  While this provider is not your primary care provider (PCP), if your PCP is located in our provider database this encounter information will be shared with them immediately following your visit.   A [place] MyChart account gives you access to today's visit and all your visits, tests, and labs performed at [facility] " click here if you don't have a [place] MyChart account or go to [email or web address]  Consent: (Patient) Lori Horne provided verbal consent for this virtual visit at the beginning of the encounter.  Current Medications:  Current Outpatient Medications:    aspirin EC (ASPIRIN LOW DOSE) 81 MG tablet, TAKE 1 TABLET(81 MG) BY MOUTH DAILY, Disp: 90 tablet, Rfl: 3   dicyclomine (BENTYL) 10 MG capsule, Take 1 capsule (10 mg total) by mouth 4 (four) times daily -  before meals and at bedtime., Disp: 40 capsule, Rfl: 0   DULoxetine (CYMBALTA) 20 MG capsule, TAKE 1 CAPSULE(20 MG) BY MOUTH DAILY, Disp: 90 capsule, Rfl: 1   esomeprazole (NEXIUM) 40 MG capsule, Take 1 capsule (40 mg total) by mouth daily., Disp: 90 capsule, Rfl: 3   glucose blood (CONTOUR NEXT TEST) test strip, CHECK BLOOD SUGAR DAILY, Disp: 100 strip, Rfl: 12   hydrochlorothiazide (HYDRODIURIL) 25 MG tablet, TAKE 1 TABLET BY MOUTH DAILY, Disp: 90 tablet, Rfl: 0   hydrocortisone (ANUSOL-HC) 2.5 % rectal cream, Place 1 application rectally 2 (two) times daily., Disp: 30 g, Rfl: 3   Insulin Pen Needle (PEN NEEDLES) 30G X 5 MM MISC, 1 each by Does not apply route once a week., Disp: 100 each, Rfl: 12   levocetirizine (XYZAL) 5 MG tablet, TAKE 1 TABLET(5 MG) BY MOUTH EVERY EVENING, Disp: 90 tablet, Rfl: 0   loperamide (IMODIUM A-D) 2 MG tablet, Take 1 tablet (2 mg total) by mouth 4 (four) times daily as needed for diarrhea or loose stools., Disp: 30 tablet, Rfl: 0   losartan (COZAAR) 25 MG tablet, Take 1 tablet  (25 mg total) by mouth daily., Disp: 90 tablet, Rfl: 1   metFORMIN (GLUCOPHAGE) 500 MG tablet, TAKE 2 TABLETS BY MOUTH TWICE  DAILY WITH A MEAL, Disp: 360 tablet, Rfl: 0   Multiple Vitamin (MULTI-VITAMINS) TABS, Take by mouth., Disp: , Rfl:    pravastatin (PRAVACHOL) 20 MG tablet, TAKE 1 TABLET BY MOUTH DAILY, Disp: 90 tablet, Rfl: 0   tirzepatide (MOUNJARO) 12.5 MG/0.5ML Pen, Inject 12.5 mg into the skin once a week., Disp: 6 mL, Rfl: 2   triamcinolone ointment (KENALOG) 0.5 %, Apply 1 application 2 (two) times daily topically., Disp: 30 g, Rfl: 1   Medications ordered in this encounter:  Meds ordered this encounter  Medications   dicyclomine (BENTYL) 10 MG capsule    Sig: Take 1 capsule (10 mg total) by mouth 4 (four) times daily -  before meals and at bedtime.    Dispense:  40 capsule    Refill:  0    Order Specific Question:   Supervising Provider    Answer:   Merrilee Jansky [id number]   loperamide (IMODIUM A-D) 2 MG tablet    Sig: Take 1 tablet (2 mg total) by mouth 4 (four) times daily as needed for diarrhea or loose stools.    Dispense:  30 tablet    Refill:  0    Order Specific Question:   Supervising Provider    Answer:   Merrilee Jansky [id number]     *  If you need refills on other medications prior to your next appointment, please contact your pharmacy*  Follow-Up: Call back or seek an in-person evaluation if the symptoms worsen or if the condition fails to improve as anticipated.  [facility] [phone]  Other Instructions  Diarrhea, Adult Diarrhea is when you pass loose and sometimes watery poop (stool) often. Diarrhea can make you feel weak and cause you to lose water in your body (get dehydrated). Losing water in your body can cause you to: Feel tired and thirsty. Have a dry mouth. Go pee (urinate) less often. Diarrhea often lasts 2-3 days. It can last longer if it is a sign of something more serious. Be sure to treat your diarrhea as told by  your doctor. Follow these instructions at home: Eating and drinking     Follow these instructions as told by your doctor: Take an ORS (oral rehydration solution). This is a drink that helps you replace fluids and minerals your body lost. It is sold at pharmacies and stores. Drink enough fluid to keep your pee (urine) pale yellow. Drink fluids such as: Water. You can also get fluids by sucking on ice chips. Diluted fruit juice. Low-calorie sports drinks. Milk. Avoid drinking fluids that have a lot of sugar or caffeine in them. These include soda, energy drinks, and regular sports drinks. Avoid alcohol. Eat bland, easy-to-digest foods in small amounts as you are able. These foods include: Bananas. Applesauce. Rice. Low-fat (lean) meats. Toast. Crackers. Avoid spicy or fatty foods.  Medicines Take over-the-counter and prescription medicines only as told by your doctor. If you were prescribed antibiotics, take them as told by your doctor. Do not stop taking them even if you start to feel better. General instructions  Wash your hands often using soap and water for 20 seconds. If soap and water are not available, use hand sanitizer. Others in your home should wash their hands as well. Wash your hands: After using the toilet or changing a diaper. Before preparing, cooking, or serving food. While caring for a sick person. While visiting someone in a hospital. Rest at home while you get better. Take a warm bath to help with any burning or pain from having diarrhea. Watch your condition for any changes. Contact a doctor if: You have a fever. Your diarrhea gets worse. You have new symptoms. You vomit every time you eat or drink. You feel light-headed, dizzy, or you have a headache. You have muscle cramps. You have signs of losing too much water in your body, such as: Dark pee, very little pee, or no pee. Cracked lips. Dry mouth. Sunken eyes. Sleepiness. Weakness. You have  bloody or black poop or poop that looks like tar. You have very bad pain, cramping, or bloating in your belly (abdomen). Your skin feels cold and clammy. You feel confused. Get help right away if: You have chest pain. Your heart is beating very quickly. You have trouble breathing or you are breathing very quickly. You feel very weak or you faint. These symptoms may be an emergency. Get help right away. Call 911. Do not wait to see if the symptoms will go away. Do not drive yourself to the hospital. This information is not intended to replace advice given to you by your health care provider. Make sure you discuss any questions you have with your health care provider. Document Revised: 06/12/2021 Document Reviewed: 06/12/2021 Elsevier Patient Education  2023 Elsevier Inc.  Food Choices to Help Relieve Diarrhea,  Adult Diarrhea can make you feel weak and cause you to become dehydrated. Dehydration is a condition in which there is not enough water or other fluids in the body. It is important to choose the right foods and drinks to: Relieve diarrhea. Replace lost fluids and nutrients. Prevent dehydration. What are tips for following this plan? Relieving diarrhea Avoid foods that make your diarrhea worse. These may include: Foods and drinks that are sweetened with high-fructose corn syrup, honey, or sweeteners such as xylitol, sorbitol, and mannitol. Check food labels for these ingredients. Fried, greasy, or spicy foods. Raw fruits and vegetables. Eat foods that are rich in probiotics. These include foods such as yogurt and fermented milk products. Probiotics can help increase healthy bacteria in your stomach and intestines (gastrointestinal or GI tract). This may help digestion and stop diarrhea. If you have lactose intolerance, avoid dairy products. These may make your diarrhea worse. Take medicine to help stop diarrhea only as told by your health care provider. Replacing nutrients  Eat  bland, easy-to-digest foods in small amounts as you are able, until your diarrhea starts to get better. These foods include bananas, applesauce, rice, toast, and crackers. Over time, add nutrient-rich foods as your body tolerates them or as told by your health care provider. These include: Well-cooked protein foods, such as eggs, lean meats like fish or chicken without skin, and tofu. Peeled, seeded, and soft-cooked fruits and vegetables. Low-fat dairy products. Whole grains. Take vitamin and mineral supplements as told by your health care provider. Preventing dehydration  Start by sipping water or a solution to prevent dehydration (oral rehydration solution, or ORS). This is a drink that helps replace fluids and minerals your body has lost. You can buy an ORS at pharmacies and retail stores. Try to drink at least 8-10 cups (2,000-2,500 mL) of fluid each day to help replace lost fluids. If your urine is pale yellow, you are getting enough fluids. You may drink other liquids in addition to water, such as fruit juice that you have added water to (diluted fruit juice) or low-calorie sports drinks, as tolerated or as told by your health care provider. Avoid drinks with caffeine, such as coffee, tea, or soft drinks. Avoid alcohol. This information is not intended to replace advice given to you by your health care provider. Make sure you discuss any questions you have with your health care provider. Document Revised: 06/12/2021 Document Reviewed: 06/12/2021 Elsevier Patient Education  2023 Elsevier Inc.     If you have been instructed to have an in-person evaluation today at a local Urgent Care facility, please use the link below. It will take you to a list of all of our available [facility], including address, phone number and hours of operation. Please do not delay care.  [facility]  If you or a family member do not have a primary care provider, use the link below to  schedule a visit and establish care. When you choose a [facility] primary care physician or advanced practice provider, you gain a long-term partner in health. Find a Primary Care Provider  Learn more about [facility]'s in-office and virtual care options: [place] - Get Care Now

## 2021-12-05 ENCOUNTER — Other Ambulatory Visit: Payer: Self-pay | Admitting: Family Medicine

## 2021-12-05 NOTE — Telephone Encounter (Signed)
Requested Prescriptions  Pending Prescriptions Disp Refills   metFORMIN (GLUCOPHAGE) 500 MG tablet [Pharmacy Med Name: metFORMIN HCl 500 MG Oral Tablet] 360 tablet 0    Sig: TAKE 2 TABLETS BY MOUTH TWICE  DAILY WITH A MEAL     Endocrinology:  Diabetes - Biguanides Failed - 12/05/2021  8:20 AM      Failed - HBA1C is between 0 and 7.9 and within 180 days    Hemoglobin A1C  Date Value Ref Range Status  11/15/2015 7.4  Final   HB A1C (BAYER DCA - WAIVED)  Date Value Ref Range Status  09/24/2021 8.6 (H) 4.8 - 5.6 % Final    Comment:             Prediabetes: 5.7 - 6.4          Diabetes: >6.4          Glycemic control for adults with diabetes: <7.0          Failed - B12 Level in normal range and within 720 days    No results found for: "VITAMINB12"       Passed - Cr in normal range and within 360 days    Creatinine, Ser  Date Value Ref Range Status  06/22/2021 0.88 0.57 - 1.00 mg/dL Final         Passed - eGFR in normal range and within 360 days    GFR calc Af Amer  Date Value Ref Range Status  10/20/2019 113 >59 mL/min/1.73 Final    Comment:    **In accordance with recommendations from the NKF-ASN Task force,**   [facility] is in the process of updating its eGFR calculation to the   2021 CKD-EPI creatinine equation that estimates kidney function   without a race variable.    GFR calc non Af Amer  Date Value Ref Range Status  10/20/2019 98 >59 mL/min/1.73 Final   eGFR  Date Value Ref Range Status  06/22/2021 76 >59 mL/min/1.73 Final         Passed - Valid encounter within last 6 months    Recent Outpatient Visits           2 months ago Type 2 diabetes mellitus with other neurologic complication, without long-term current use of insulin ([place])   [place], Megan P, DO   5 months ago Routine general medical examination at a health care facility   [facility] [place] P, DO   7 months ago Type 2 diabetes mellitus with other  neurologic complication, without long-term current use of insulin ([place])   [facility], Megan P, DO   9 months ago Type 2 diabetes mellitus with other neurologic complication, without long-term current use of insulin ([place])   [place], Megan P, DO   1 year ago Type 2 diabetes mellitus with other neurologic complication, without long-term current use of insulin ([place])   [place], [place], DO       Future Appointments             In 2 weeks Johnson, Megan P, DO [place]            Passed - CBC within normal limits and completed in the last 12 months    WBC  Date Value Ref Range Status  06/22/2021 6.9 3.4 - 10.8 x10E3/uL Final   RBC  Date Value Ref Range Status  06/22/2021 4.64 3.77 - 5.28 x10E6/uL Final  Hemoglobin  Date Value Ref Range Status  06/22/2021 14.2 11.1 - 15.9 g/dL Final   Hematocrit  Date Value Ref Range Status  06/22/2021 42.3 34.0 - 46.6 % Final   MCHC  Date Value Ref Range Status  06/22/2021 33.6 31.5 - 35.7 g/dL Final   [facility]  Date Value Ref Range Status  06/22/2021 30.6 26.6 - 33.0 pg Final   MCV  Date Value Ref Range Status  06/22/2021 91 79 - 97 fL Final   No results found for: "PLTCOUNTKUC", "LABPLAT", "POCPLA" RDW  Date Value Ref Range Status  06/22/2021 12.6 11.7 - 15.4 % Final

## 2021-12-24 ENCOUNTER — Encounter: Payer: Self-pay | Admitting: Family Medicine

## 2021-12-24 ENCOUNTER — Ambulatory Visit: Payer: 59 | Admitting: Family Medicine

## 2021-12-24 VITALS — BP 130/77 | HR 83 | Temp 98.0°F | Ht 63.0 in | Wt 172.2 lb

## 2021-12-24 DIAGNOSIS — E1169 Type 2 diabetes mellitus with other specified complication: Secondary | ICD-10-CM | POA: Diagnosis not present

## 2021-12-24 DIAGNOSIS — E785 Hyperlipidemia, unspecified: Secondary | ICD-10-CM

## 2021-12-24 DIAGNOSIS — E1149 Type 2 diabetes mellitus with other diabetic neurological complication: Secondary | ICD-10-CM | POA: Diagnosis not present

## 2021-12-24 DIAGNOSIS — I10 Essential (primary) hypertension: Secondary | ICD-10-CM | POA: Diagnosis not present

## 2021-12-24 DIAGNOSIS — F4321 Adjustment disorder with depressed mood: Secondary | ICD-10-CM

## 2021-12-24 LAB — BAYER DCA HB A1C WAIVED: HB A1C (BAYER DCA - WAIVED): 8.8 % — ABNORMAL HIGH (ref 4.8–5.6)

## 2021-12-24 MED ORDER — PRAVASTATIN SODIUM 20 MG PO TABS: 20.0000 mg | ORAL_TABLET | Freq: Every day | ORAL | 1 refills | Status: DC

## 2021-12-24 MED ORDER — HYDROCHLOROTHIAZIDE 25 MG PO TABS: ORAL_TABLET | ORAL | 1 refills | Status: DC

## 2021-12-24 MED ORDER — METFORMIN HCL 500 MG PO TABS: 1000.0000 mg | ORAL_TABLET | Freq: Two times a day (BID) | ORAL | 1 refills | Status: DC

## 2021-12-24 MED ORDER — TIRZEPATIDE 12.5 MG/0.5ML ~~LOC~~ SOAJ: 12.5000 mg | SUBCUTANEOUS | 2 refills | Status: DC

## 2021-12-24 MED ORDER — LEVOCETIRIZINE DIHYDROCHLORIDE 5 MG PO TABS: ORAL_TABLET | ORAL | 3 refills | Status: DC

## 2021-12-24 NOTE — Assessment & Plan Note (Signed)
Not under good control with A1c of 8.8. Will try berbere and will recheck in 3 months. Call with any concerns.

## 2021-12-24 NOTE — Assessment & Plan Note (Signed)
Under good control on current regimen. Continue current regimen. Continue to monitor. Call with any concerns. Refills given. Labs drawn today.   

## 2021-12-24 NOTE — Progress Notes (Signed)
BP 130/77   Pulse 83   Temp 98 F (36.7 C) (Oral)   Ht 5\' 3"  (1.6 m)   Wt 172 lb 3.2 oz (78.1 kg)   SpO2 98%   BMI 30.50 kg/m    Subjective:    Patient ID: , female    DOB: [birth year], 56 y.o.   MRN: [id number]  HPI: Lori Horne is a 56 y.o. female  Chief Complaint  Patient presents with   Diabetes   Hypertension   DIABETES Hypoglycemic episodes:no Polydipsia/polyuria: no Visual disturbance: no Chest pain: no Paresthesias: no Glucose Monitoring: yes  Accucheck frequency:  occasionally Taking Insulin?: no Blood Pressure Monitoring: not checking Retinal Examination: Up to Date Foot Exam: Up to Date Diabetic Education: Completed Pneumovax: Up to Date Influenza: declined Aspirin: yes  HYPERTENSION / HYPERLIPIDEMIA Satisfied with current treatment? yes Duration of hypertension: chronic BP monitoring frequency: not checking BP medication side effects: no Past BP meds: losartan Duration of hyperlipidemia: chronic Cholesterol medication side effects: no Cholesterol supplements: none Past cholesterol medications: pravastatin Medication compliance: excellent compliance Aspirin: yes Recent stressors: no Recurrent headaches: no Visual changes: no Palpitations: no Dyspnea: no Chest pain: no Lower extremity edema: no Dizzy/lightheaded: no  DEPRESSION Mood status: better Satisfied with current treatment?: yes Symptom severity: mild  Duration of current treatment : months Side effects: no Medication compliance: excellent compliance Psychotherapy/counseling: no  Previous psychiatric medications: cymbalta Depressed mood: no Anxious mood: no Anhedonia: no Significant weight loss or gain: no Insomnia: no  Fatigue: no Feelings of worthlessness or guilt: no Impaired concentration/indecisiveness: no Suicidal ideations: no Hopelessness: no Crying spells: no    12/24/2021    8:15 AM 09/24/2021    8:21 AM 06/22/2021    8:11 AM 04/11/2021    8:26  AM 02/09/2021    8:51 AM  Depression screen PHQ 2/9  Decreased Interest 0 0 0 0 0  Down, Depressed, Hopeless 0 0 0 0 0  PHQ - 2 Score 0 0 0 0 0  Altered sleeping 1 0 0 0 0  Tired, decreased energy 1 0 1 0 1  Change in appetite 0 0 0 0 0  Feeling bad or failure about yourself  0 0 0 0 0  Trouble concentrating 0 0 0 0 0  Moving slowly or fidgety/restless 0 0 0 0 0  Suicidal thoughts 0 0 0 0 0  PHQ-9 Score 2 0 1 0 1  Difficult doing work/chores Not difficult at all Not difficult at all Not difficult at all     Relevant past medical, surgical, family and social history reviewed and updated as indicated. Interim medical history since our last visit reviewed. Allergies and medications reviewed and updated.  Review of Systems  Constitutional: Negative.   Respiratory: Negative.    Cardiovascular: Negative.   Gastrointestinal: Negative.   Musculoskeletal: Negative.   Neurological: Negative.   Psychiatric/Behavioral: Negative.      Per HPI unless specifically indicated above     Objective:    BP 130/77   Pulse 83   Temp 98 F (36.7 C) (Oral)   Ht 5\' 3"  (1.6 m)   Wt 172 lb 3.2 oz (78.1 kg)   SpO2 98%   BMI 30.50 kg/m   Wt Readings from Last 3 Encounters:  12/24/21 172 lb 3.2 oz (78.1 kg)  09/24/21 174 lb (78.9 kg)  06/22/21 177 lb 6.4 oz (80.5 kg)    Physical Exam Vitals and nursing note reviewed.  Constitutional:  General: She is not in acute distress.    Appearance: Normal appearance. She is not ill-appearing, toxic-appearing or diaphoretic.  HENT:     Head: Normocephalic and atraumatic.     Right Ear: External ear normal.     Left Ear: External ear normal.     Nose: Nose normal.     Mouth/Throat:     Mouth: Mucous membranes are moist.     Pharynx: Oropharynx is clear.  Eyes:     General: No scleral icterus.       Right eye: No discharge.        Left eye: No discharge.     Extraocular Movements: Extraocular movements intact.     Conjunctiva/sclera:  Conjunctivae normal.     Pupils: Pupils are equal, round, and reactive to light.  Cardiovascular:     Rate and Rhythm: Normal rate and regular rhythm.     Pulses: Normal pulses.     Heart sounds: Normal heart sounds. No murmur heard.    No friction rub. No gallop.  Pulmonary:     Effort: Pulmonary effort is normal. No respiratory distress.     Breath sounds: Normal breath sounds. No stridor. No wheezing, rhonchi or rales.  Chest:     Chest wall: No tenderness.  Musculoskeletal:        General: Normal range of motion.     Cervical back: Normal range of motion and neck supple.  Skin:    General: Skin is warm and dry.     Capillary Refill: Capillary refill takes less than 2 seconds.     Coloration: Skin is not jaundiced or pale.     Findings: No bruising, erythema, lesion or rash.  Neurological:     General: No focal deficit present.     Mental Status: She is alert and oriented to person, place, and time. Mental status is at baseline.  Psychiatric:        Mood and Affect: Mood normal.        Behavior: Behavior normal.        Thought Content: Thought content normal.        Judgment: Judgment normal.     Results for orders placed or performed in visit on 12/24/21  HM DIABETES EYE EXAM  Result Value Ref Range   HM Diabetic Eye Exam No Retinopathy No Retinopathy      Assessment & Plan:   Problem List Items Addressed This Visit       Cardiovascular and Mediastinum   Hypertension    Under good control on current regimen. Continue current regimen. Continue to monitor. Call with any concerns. Refills given. Labs drawn today.        Relevant Medications   hydrochlorothiazide (HYDRODIURIL) 25 MG tablet   pravastatin (PRAVACHOL) 20 MG tablet     Endocrine   Type 2 diabetes mellitus (HCC) - Primary    Not under good control with A1c of 8.8. Will try berbere and will recheck in 3 months. Call with any concerns.       Relevant Medications   metFORMIN (GLUCOPHAGE) 500 MG  tablet   pravastatin (PRAVACHOL) 20 MG tablet   tirzepatide (MOUNJARO) 12.5 MG/0.5ML Pen   Other Relevant Orders   Bayer DCA Hb A1c Waived   Lipid Panel w/o Chol/HDL Ratio   Comprehensive metabolic panel   CBC with Differential/Platelet   Hyperlipidemia associated with type 2 diabetes mellitus (HCC)    Under good control on current regimen. Continue current regimen. Continue to  monitor. Call with any concerns. Refills given. Labs drawn today.       Relevant Medications   hydrochlorothiazide (HYDRODIURIL) 25 MG tablet   metFORMIN (GLUCOPHAGE) 500 MG tablet   pravastatin (PRAVACHOL) 20 MG tablet   tirzepatide (MOUNJARO) 12.5 MG/0.5ML Pen     Other   Complicated grief    Under good control on current regimen. Continue current regimen. Continue to monitor. Call with any concerns. Refills given. Labs drawn today.         Follow up plan: Return in about 3 months (around 03/25/2022).

## 2021-12-25 LAB — CBC WITH DIFFERENTIAL/PLATELET
Basophils Absolute: 0.1 10*3/uL (ref 0.0–0.2)
Basos: 1 %
EOS (ABSOLUTE): 0.7 10*3/uL — ABNORMAL HIGH (ref 0.0–0.4)
Eos: 6 %
Hematocrit: 40.8 % (ref 34.0–46.6)
Hemoglobin: 13.4 g/dL (ref 11.1–15.9)
Immature Grans (Abs): 0 10*3/uL (ref 0.0–0.1)
Immature Granulocytes: 0 %
Lymphocytes Absolute: 2.2 10*3/uL (ref 0.7–3.1)
Lymphs: 19 %
MCH: 30.1 pg (ref 26.6–33.0)
MCHC: 32.8 g/dL (ref 31.5–35.7)
MCV: 92 fL (ref 79–97)
Monocytes Absolute: 0.6 10*3/uL (ref 0.1–0.9)
Monocytes: 5 %
Neutrophils Absolute: 8.1 10*3/uL — ABNORMAL HIGH (ref 1.4–7.0)
Neutrophils: 69 %
Platelets: 318 10*3/uL (ref 150–450)
RBC: 4.45 x10E6/uL (ref 3.77–5.28)
RDW: 12.3 % (ref 11.7–15.4)
WBC: 11.7 10*3/uL — ABNORMAL HIGH (ref 3.4–10.8)

## 2021-12-25 LAB — COMPREHENSIVE METABOLIC PANEL
ALT: 20 IU/L (ref 0–32)
AST: 20 IU/L (ref 0–40)
Albumin/Globulin Ratio: 1.2 (ref 1.2–2.2)
Albumin: 4 g/dL (ref 3.8–4.9)
Alkaline Phosphatase: 92 IU/L (ref 44–121)
BUN/Creatinine Ratio: 16 (ref 9–23)
BUN: 14 mg/dL (ref 6–24)
Bilirubin Total: 0.3 mg/dL (ref 0.0–1.2)
CO2: 20 mmol/L (ref 20–29)
Calcium: 9.2 mg/dL (ref 8.7–10.2)
Chloride: 95 mmol/L — ABNORMAL LOW (ref 96–106)
Creatinine, Ser: 0.88 mg/dL (ref 0.57–1.00)
Globulin, Total: 3.3 g/dL (ref 1.5–4.5)
Glucose: 241 mg/dL — ABNORMAL HIGH (ref 70–99)
Potassium: 3.8 mmol/L (ref 3.5–5.2)
Sodium: 134 mmol/L (ref 134–144)
Total Protein: 7.3 g/dL (ref 6.0–8.5)
eGFR: 76 mL/min/{1.73_m2} (ref >59)

## 2021-12-25 LAB — LIPID PANEL W/O CHOL/HDL RATIO
Cholesterol, Total: 174 mg/dL (ref 100–199)
HDL: 36 mg/dL — ABNORMAL LOW (ref >39)
LDL Chol Calc (NIH): 88 mg/dL (ref 0–99)
Triglycerides: 304 mg/dL — ABNORMAL HIGH (ref 0–149)
VLDL Cholesterol Cal: 50 mg/dL — ABNORMAL HIGH (ref 5–40)

## 2022-01-01 ENCOUNTER — Encounter: Payer: Self-pay | Admitting: Family Medicine

## 2022-01-02 ENCOUNTER — Other Ambulatory Visit: Payer: Self-pay | Admitting: Family Medicine

## 2022-01-02 MED ORDER — RYBELSUS 14 MG PO TABS: 14.0000 mg | ORAL_TABLET | Freq: Every day | ORAL | 6 refills | Status: DC

## 2022-02-07 ENCOUNTER — Other Ambulatory Visit: Payer: Self-pay | Admitting: Family Medicine

## 2022-02-07 NOTE — Telephone Encounter (Signed)
Requested Prescriptions  Pending Prescriptions Disp Refills   losartan (COZAAR) 25 MG tablet [Pharmacy Med Name: Losartan Potassium 25 MG Oral Tablet] 90 tablet 0    Sig: TAKE 1 TABLET BY MOUTH DAILY     Cardiovascular:  Angiotensin Receptor Blockers Passed - 02/07/2022  6:44 AM      Passed - Cr in normal range and within 180 days    Creatinine, Ser  Date Value Ref Range Status  12/24/2021 0.88 0.57 - 1.00 mg/dL Final         Passed - K in normal range and within 180 days    Potassium  Date Value Ref Range Status  12/24/2021 3.8 3.5 - 5.2 mmol/L Final         Passed - Patient is not pregnant      Passed - Last BP in normal range    BP Readings from Last 1 Encounters:  12/24/21 130/77         Passed - Valid encounter within last 6 months    Recent Outpatient Visits           1 month ago Type 2 diabetes mellitus with other neurologic complication, without long-term current use of insulin ([place])   [place], Megan P, DO   4 months ago Type 2 diabetes mellitus with other neurologic complication, without long-term current use of insulin ([place])   [place], Megan P, DO   7 months ago Routine general medical examination at a health care facility   [facility] [place] P, DO   10 months ago Type 2 diabetes mellitus with other neurologic complication, without long-term current use of insulin ([place])   [place], Megan P, DO   12 months ago Type 2 diabetes mellitus with other neurologic complication, without long-term current use of insulin ([place])   [place], Barb Merino, DO       Future Appointments             In 1 month Johnson, Barb Merino, DO [place], [facility]

## 2022-03-25 ENCOUNTER — Encounter: Payer: Self-pay | Admitting: Family Medicine

## 2022-03-25 ENCOUNTER — Ambulatory Visit: Payer: Managed Care, Other (non HMO) | Admitting: Family Medicine

## 2022-03-25 VITALS — BP 124/79 | HR 65 | Temp 97.9°F | Ht 63.0 in | Wt 177.0 lb

## 2022-03-25 DIAGNOSIS — E1149 Type 2 diabetes mellitus with other diabetic neurological complication: Secondary | ICD-10-CM

## 2022-03-25 LAB — BAYER DCA HB A1C WAIVED: HB A1C (BAYER DCA - WAIVED): 11 % — ABNORMAL HIGH (ref 4.8–5.6)

## 2022-03-25 MED ORDER — RYBELSUS 7 MG PO TABS: 7.0000 mg | ORAL_TABLET | Freq: Every day | ORAL | 2 refills | Status: DC

## 2022-03-25 MED ORDER — PRAVASTATIN SODIUM 20 MG PO TABS: 20.0000 mg | ORAL_TABLET | ORAL | 0 refills | Status: DC

## 2022-03-25 NOTE — Progress Notes (Signed)
BP 124/79   Pulse 65   Temp 97.9 F (36.6 C) (Oral)   Ht 5\' 3"  (1.6 m)   Wt 177 lb (80.3 kg)   SpO2 97%   BMI 31.35 kg/m    Subjective:    Patient ID: Lori Horne, female    DOB: 11-05-1965, 57 y.o.   MRN: [id number]  HPI: Lori Horne is a 57 y.o. female  Chief Complaint  Patient presents with   Diabetes   DIABETES Hypoglycemic episodes:no Polydipsia/polyuria: no Visual disturbance: no Chest pain: no Paresthesias: no Glucose Monitoring: yes  Accucheck frequency: Daily Taking Insulin?: no Blood Pressure Monitoring: not checking Retinal Examination: Up to Date Foot Exam: Up to Date Diabetic Education: Completed Pneumovax: Up to Date Influenza: Not Up to Date Aspirin: no   Relevant past medical, surgical, family and social history reviewed and updated as indicated. Interim medical history since our last visit reviewed. Allergies and medications reviewed and updated.  Review of Systems  Constitutional: Negative.   Respiratory: Negative.    Cardiovascular: Negative.   Gastrointestinal: Negative.   Musculoskeletal: Negative.   Psychiatric/Behavioral: Negative.      Per HPI unless specifically indicated above     Objective:    BP 124/79   Pulse 65   Temp 97.9 F (36.6 C) (Oral)   Ht 5\' 3"  (1.6 m)   Wt 177 lb (80.3 kg)   SpO2 97%   BMI 31.35 kg/m   Wt Readings from Last 3 Encounters:  03/25/22 177 lb (80.3 kg)  12/24/21 172 lb 3.2 oz (78.1 kg)  09/24/21 174 lb (78.9 kg)    Physical Exam Vitals and nursing note reviewed.  Constitutional:      General: She is not in acute distress.    Appearance: Normal appearance. She is not ill-appearing, toxic-appearing or diaphoretic.  HENT:     Head: Normocephalic and atraumatic.     Right Ear: External ear normal.     Left Ear: External ear normal.     Nose: Nose normal.     Mouth/Throat:     Mouth: Mucous membranes are moist.     Pharynx: Oropharynx is clear.  Eyes:     General: No scleral  icterus.       Right eye: No discharge.        Left eye: No discharge.     Extraocular Movements: Extraocular movements intact.     Conjunctiva/sclera: Conjunctivae normal.     Pupils: Pupils are equal, round, and reactive to light.  Cardiovascular:     Rate and Rhythm: Normal rate and regular rhythm.     Pulses: Normal pulses.     Heart sounds: Normal heart sounds. No murmur heard.    No friction rub. No gallop.  Pulmonary:     Effort: Pulmonary effort is normal. No respiratory distress.     Breath sounds: Normal breath sounds. No stridor. No wheezing, rhonchi or rales.  Chest:     Chest wall: No tenderness.  Musculoskeletal:        General: Normal range of motion.     Cervical back: Normal range of motion and neck supple.  Skin:    General: Skin is warm and dry.     Capillary Refill: Capillary refill takes less than 2 seconds.     Coloration: Skin is not jaundiced or pale.     Findings: No bruising, erythema, lesion or rash.  Neurological:     General: No focal deficit present.     Mental  Status: She is alert and oriented to person, place, and time. Mental status is at baseline.  Psychiatric:        Mood and Affect: Mood normal.        Behavior: Behavior normal.        Thought Content: Thought content normal.        Judgment: Judgment normal.     Results for orders placed or performed in visit on 12/24/21  Bayer DCA Hb A1c Waived  Result Value Ref Range   HB A1C (BAYER DCA - WAIVED) 8.8 (H) 4.8 - 5.6 %  Lipid Panel w/o Chol/HDL Ratio  Result Value Ref Range   Cholesterol, Total 174 100 - 199 mg/dL   Triglycerides 304 (H) 0 - 149 mg/dL   HDL 36 (L) >39 mg/dL   VLDL Cholesterol Cal 50 (H) 5 - 40 mg/dL   LDL Chol Calc (NIH) 88 0 - 99 mg/dL  Comprehensive metabolic panel  Result Value Ref Range   Glucose 241 (H) 70 - 99 mg/dL   BUN 14 6 - 24 mg/dL   Creatinine, Ser 0.88 0.57 - 1.00 mg/dL   eGFR 76 >59 mL/min/1.73   BUN/Creatinine Ratio 16 9 - 23   Sodium 134 134 -  144 mmol/L   Potassium 3.8 3.5 - 5.2 mmol/L   Chloride 95 (L) 96 - 106 mmol/L   CO2 20 20 - 29 mmol/L   Calcium 9.2 8.7 - 10.2 mg/dL   Total Protein 7.3 6.0 - 8.5 g/dL   Albumin 4.0 3.8 - 4.9 g/dL   Globulin, Total 3.3 1.5 - 4.5 g/dL   Albumin/Globulin Ratio 1.2 1.2 - 2.2   Bilirubin Total 0.3 0.0 - 1.2 mg/dL   Alkaline Phosphatase 92 44 - 121 IU/L   AST 20 0 - 40 IU/L   ALT 20 0 - 32 IU/L  CBC with Differential/Platelet  Result Value Ref Range   WBC 11.7 (H) 3.4 - 10.8 x10E3/uL   RBC 4.45 3.77 - 5.28 x10E6/uL   Hemoglobin 13.4 11.1 - 15.9 g/dL   Hematocrit 40.8 34.0 - 46.6 %   MCV 92 79 - 97 fL   MCH 30.1 26.6 - 33.0 pg   MCHC 32.8 31.5 - 35.7 g/dL   RDW 12.3 11.7 - 15.4 %   Platelets 318 150 - 450 x10E3/uL   Neutrophils 69 Not Estab. %   Lymphs 19 Not Estab. %   Monocytes 5 Not Estab. %   Eos 6 Not Estab. %   Basos 1 Not Estab. %   Neutrophils Absolute 8.1 (H) 1.4 - 7.0 x10E3/uL   Lymphocytes Absolute 2.2 0.7 - 3.1 x10E3/uL   Monocytes Absolute 0.6 0.1 - 0.9 x10E3/uL   EOS (ABSOLUTE) 0.7 (H) 0.0 - 0.4 x10E3/uL   Basophils Absolute 0.1 0.0 - 0.2 x10E3/uL   Immature Granulocytes 0 Not Estab. %   Immature Grans (Abs) 0.0 0.0 - 0.1 x10E3/uL  HM DIABETES EYE EXAM  Result Value Ref Range   HM Diabetic Eye Exam No Retinopathy No Retinopathy      Assessment & Plan:   Problem List Items Addressed This Visit       Endocrine   Type 2 diabetes mellitus ([place]) - Primary    Not under good control with A1c of 11.0. Will start rybelsus and recheck 3 months. Continue to work on diet and exercise. Call with any concerns.       Relevant Medications   pravastatin (PRAVACHOL) 20 MG tablet   Semaglutide (RYBELSUS)  7 MG TABS   Other Relevant Orders   Bayer DCA Hb A1c Waived (STAT)     Follow up plan: Return in about 3 months (around 06/25/2022) for physical.

## 2022-03-25 NOTE — Assessment & Plan Note (Signed)
Not under good control with A1c of 11.0. Will start rybelsus and recheck 3 months. Continue to work on diet and exercise. Call with any concerns.

## 2022-03-26 ENCOUNTER — Telehealth: Payer: Self-pay

## 2022-03-26 DIAGNOSIS — E1149 Type 2 diabetes mellitus with other diabetic neurological complication: Secondary | ICD-10-CM

## 2022-03-26 DIAGNOSIS — E785 Hyperlipidemia, unspecified: Secondary | ICD-10-CM

## 2022-03-26 MED ORDER — ONETOUCH DELICA LANCETS 33G MISC: 3 refills | Status: AC

## 2022-03-26 MED ORDER — ONETOUCH VERIO W/DEVICE KIT: 1.0000 | PACK | Freq: Every day | 0 refills | Status: AC

## 2022-03-26 MED ORDER — GLUCOSE BLOOD VI STRP: ORAL_STRIP | 12 refills | Status: DC

## 2022-03-26 NOTE — Telephone Encounter (Signed)
Diabetic Supplies and Diabetic Kit have been ordered and sent to patient local pharmacy.

## 2022-03-26 NOTE — Telephone Encounter (Signed)
-----   Message from Valerie Roys, DO sent at 03/25/2022  8:32 AM EDT ----- Diabetic supplies and monitor please

## 2022-03-29 ENCOUNTER — Encounter: Payer: Self-pay | Admitting: Family Medicine

## 2022-04-03 ENCOUNTER — Other Ambulatory Visit: Payer: Self-pay

## 2022-04-03 NOTE — Progress Notes (Signed)
Entered in error

## 2022-04-14 ENCOUNTER — Other Ambulatory Visit: Payer: Self-pay | Admitting: Family Medicine

## 2022-04-16 ENCOUNTER — Other Ambulatory Visit: Payer: Self-pay | Admitting: Family Medicine

## 2022-04-16 NOTE — Telephone Encounter (Signed)
Requested Prescriptions  Pending Prescriptions Disp Refills   losartan (COZAAR) 25 MG tablet [Pharmacy Med Name: Losartan Potassium 25 MG Oral Tablet] 90 tablet 1    Sig: TAKE 1 TABLET BY MOUTH DAILY     Cardiovascular:  Angiotensin Receptor Blockers Passed - 04/16/2022  6:09 AM      Passed - Cr in normal range and within 180 days    Creatinine, Ser  Date Value Ref Range Status  12/24/2021 0.88 0.57 - 1.00 mg/dL Final         Passed - K in normal range and within 180 days    Potassium  Date Value Ref Range Status  12/24/2021 3.8 3.5 - 5.2 mmol/L Final         Passed - Patient is not pregnant      Passed - Last BP in normal range    BP Readings from Last 1 Encounters:  03/25/22 124/79         Passed - Valid encounter within last 6 months    Recent Outpatient Visits           3 weeks ago Type 2 diabetes mellitus with other neurologic complication, without long-term current use of insulin (HCC)   [place] [facility] [place], Megan P, DO   3 months ago Type 2 diabetes mellitus with other neurologic complication, without long-term current use of insulin (HCC)   [place] [facility] [place], Megan P, DO   6 months ago Type 2 diabetes mellitus with other neurologic complication, without long-term current use of insulin (HCC)   [place] [facility] [place], Megan P, DO   9 months ago Routine general medical examination at a health care facility   [place] [facility] [place], [place] P, DO   1 year ago Type 2 diabetes mellitus with other neurologic complication, without long-term current use of insulin [facility])   [place] [place], Oralia Rud, DO       Future Appointments             In 2 months Laural Benes, Oralia Rud, DO [place] [facility], [facility]

## 2022-04-16 NOTE — Telephone Encounter (Signed)
Requested Prescriptions  Pending Prescriptions Disp Refills   esomeprazole (NEXIUM) 40 MG capsule [Pharmacy Med Name: Esomeprazole Magnesium 40 MG Oral Capsule Delayed Release] 90 capsule 0    Sig: TAKE 1 CAPSULE BY MOUTH DAILY     Gastroenterology: Proton Pump Inhibitors 2 Passed - 04/14/2022  6:13 AM      Passed - ALT in normal range and within 360 days    ALT  Date Value Ref Range Status  12/24/2021 20 0 - 32 IU/L Final         Passed - AST in normal range and within 360 days    AST  Date Value Ref Range Status  12/24/2021 20 0 - 40 IU/L Final         Passed - Valid encounter within last 12 months    Recent Outpatient Visits           3 weeks ago Type 2 diabetes mellitus with other neurologic complication, without long-term current use of insulin (HCC)   [place] [facility] [place], Megan P, DO   3 months ago Type 2 diabetes mellitus with other neurologic complication, without long-term current use of insulin (HCC)   [place] [facility] [place], Megan P, DO   6 months ago Type 2 diabetes mellitus with other neurologic complication, without long-term current use of insulin (HCC)   [place] [facility] [place], Megan P, DO   9 months ago Routine general medical examination at a health care facility   [facility] [place], [place] P, DO   1 year ago Type 2 diabetes mellitus with other neurologic complication, without long-term current use of insulin (HCC)   [place] [facility] [place], [place], DO       Future Appointments             In 2 months Johnson, Megan P, DO  [facility], [facility]             DULoxetine (CYMBALTA) 20 MG capsule [Pharmacy Med Name: DULoxetine HCl 20 MG Oral Capsule Delayed Release Particles] 90 capsule 0    Sig: TAKE 1 CAPSULE BY MOUTH DAILY     Psychiatry: Antidepressants - SNRI - duloxetine Passed - 04/14/2022  6:13 AM      Passed - Cr  in normal range and within 360 days    Creatinine, Ser  Date Value Ref Range Status  12/24/2021 0.88 0.57 - 1.00 mg/dL Final         Passed - eGFR is 30 or above and within 360 days    GFR calc Af Amer  Date Value Ref Range Status  10/20/2019 113 >59 mL/min/1.73 Final    Comment:    **In accordance with recommendations from the NKF-ASN Task force,**   [facility] is in the process of updating its eGFR calculation to the   2021 CKD-EPI creatinine equation that estimates kidney function   without a race variable.    GFR calc non Af Amer  Date Value Ref Range Status  10/20/2019 98 >59 mL/min/1.73 Final   eGFR  Date Value Ref Range Status  12/24/2021 76 >59 mL/min/1.73 Final         Passed - Completed PHQ-2 or PHQ-9 in the last 360 days      Passed - Last BP in normal range    BP Readings from Last 1 Encounters:  03/25/22 124/79         Passed - Valid  encounter within last 6 months    Recent Outpatient Visits           3 weeks ago Type 2 diabetes mellitus with other neurologic complication, without long-term current use of insulin (HCC)   [place] [facility] [place], Megan P, DO   3 months ago Type 2 diabetes mellitus with other neurologic complication, without long-term current use of insulin (HCC)   [place] [facility] [place], Megan P, DO   6 months ago Type 2 diabetes mellitus with other neurologic complication, without long-term current use of insulin (HCC)   [place] [facility] [place], Megan P, DO   9 months ago Routine general medical examination at a health care facility   [facility] [place], [place] P, DO   1 year ago Type 2 diabetes mellitus with other neurologic complication, without long-term current use of insulin [facility])   [place] [facility] [place], Oralia Rud, DO       Future Appointments             In 2 months Laural Benes, Oralia Rud, DO [place] [facility], [facility]

## 2022-06-19 ENCOUNTER — Telehealth: Payer: Self-pay

## 2022-06-19 NOTE — Telephone Encounter (Signed)
-----   Message from Pablo Ledger, CMA sent at 06/17/2022  8:54 AM EDT ----- Patient needs TOC call completed please.

## 2022-06-19 NOTE — Transitions of Care (Post Inpatient/ED Visit) (Signed)
   06/19/2022  Name: Lori Horne MRN: [id number] DOB: 12-Jan-1965  Today's TOC FU Call Status:    Attempted to reach the patient regarding the most recent Inpatient/ED visit.  Follow Up Plan: Additional outreach attempts will be made to reach the patient to complete the Transitions of Care (Post Inpatient/ED visit) call.   Signature Malen Gauze, [place]

## 2022-06-25 ENCOUNTER — Ambulatory Visit (INDEPENDENT_AMBULATORY_CARE_PROVIDER_SITE_OTHER): Payer: Managed Care, Other (non HMO) | Admitting: Family Medicine

## 2022-06-25 ENCOUNTER — Encounter: Payer: Self-pay | Admitting: Family Medicine

## 2022-06-25 VITALS — BP 125/79 | HR 76 | Temp 98.3°F | Ht 64.5 in | Wt 178.4 lb

## 2022-06-25 DIAGNOSIS — Z1231 Encounter for screening mammogram for malignant neoplasm of breast: Secondary | ICD-10-CM | POA: Diagnosis not present

## 2022-06-25 DIAGNOSIS — Z0000 Encounter for general adult medical examination without abnormal findings: Secondary | ICD-10-CM | POA: Diagnosis not present

## 2022-06-25 DIAGNOSIS — G44319 Acute post-traumatic headache, not intractable: Secondary | ICD-10-CM | POA: Diagnosis not present

## 2022-06-25 DIAGNOSIS — F4321 Adjustment disorder with depressed mood: Secondary | ICD-10-CM

## 2022-06-25 DIAGNOSIS — E1169 Type 2 diabetes mellitus with other specified complication: Secondary | ICD-10-CM

## 2022-06-25 DIAGNOSIS — E1149 Type 2 diabetes mellitus with other diabetic neurological complication: Secondary | ICD-10-CM

## 2022-06-25 DIAGNOSIS — I10 Essential (primary) hypertension: Secondary | ICD-10-CM

## 2022-06-25 DIAGNOSIS — E785 Hyperlipidemia, unspecified: Secondary | ICD-10-CM

## 2022-06-25 DIAGNOSIS — R1011 Right upper quadrant pain: Secondary | ICD-10-CM | POA: Diagnosis not present

## 2022-06-25 DIAGNOSIS — Z7984 Long term (current) use of oral hypoglycemic drugs: Secondary | ICD-10-CM

## 2022-06-25 LAB — URINALYSIS, ROUTINE W REFLEX MICROSCOPIC
Bilirubin, UA: NEGATIVE
Leukocytes,UA: NEGATIVE
Nitrite, UA: NEGATIVE
RBC, UA: NEGATIVE
Specific Gravity, UA: 1.02 (ref 1.005–1.030)
Urobilinogen, Ur: 0.2 mg/dL (ref 0.2–1.0)
pH, UA: 5.5 (ref 5.0–7.5)

## 2022-06-25 LAB — MICROSCOPIC EXAMINATION
Bacteria, UA: NONE SEEN
WBC, UA: NONE SEEN /hpf (ref 0–5)

## 2022-06-25 LAB — MICROALBUMIN, URINE WAIVED
Creatinine, Urine Waived: 50 mg/dL (ref 10–300)
Microalb, Ur Waived: 80 mg/L — ABNORMAL HIGH (ref 0–19)
Microalb/Creat Ratio: >300 mg/g — ABNORMAL HIGH (ref <30)

## 2022-06-25 LAB — BAYER DCA HB A1C WAIVED: HB A1C (BAYER DCA - WAIVED): 12.2 % — ABNORMAL HIGH (ref 4.8–5.6)

## 2022-06-25 MED ORDER — PRAVASTATIN SODIUM 20 MG PO TABS: 20.0000 mg | ORAL_TABLET | ORAL | 0 refills | Status: DC

## 2022-06-25 MED ORDER — RYBELSUS 14 MG PO TABS
14.0000 mg | ORAL_TABLET | Freq: Every day | ORAL | 1 refills | Status: DC
Start: 2022-06-25 — End: 2022-12-02

## 2022-06-25 NOTE — Progress Notes (Signed)
BP 125/79   Pulse 76   Temp 98.3 F (36.8 C) (Oral)   Ht 5' 4.5" (1.638 m)   Wt 178 lb 6.4 oz (80.9 kg)   SpO2 99%   BMI 30.15 kg/m    Subjective:    Patient ID: Lori Horne, female    DOB: Sep 17, 1965, 57 y.o.   MRN: [id number]  HPI: Lori Horne is a 57 y.o. female presenting on 06/25/2022 for comprehensive medical examination. Current medical complaints include:  DIABETES Hypoglycemic episodes:no Polydipsia/polyuria: no Visual disturbance: no Chest pain: no Paresthesias: no Glucose Monitoring: yes Taking Insulin?: no Blood Pressure Monitoring: not checking Retinal Examination: Up to Date Foot Exam: Not up to Date Diabetic Education: Completed Pneumovax: Up to Date Influenza: Not up to Date Aspirin: yes  HYPERTENSION / HYPERLIPIDEMIA Satisfied with current treatment? yes Duration of hypertension: chronic BP monitoring frequency: not checking BP medication side effects: no Past BP meds: hydrochlorothiazide, losartan Duration of hyperlipidemia: chronic Cholesterol medication side effects: no Cholesterol supplements: none Past cholesterol medications: pravastatin Medication compliance: excellent compliance Aspirin: yes Recent stressors: no Recurrent headaches: no Visual changes: no Palpitations: no Dyspnea: no Chest pain: no Lower extremity edema: no Dizzy/lightheaded: no  ER FOLLOW UP Time since discharge: 10 days Hospital/facility: [facility] Diagnosis: Headache Procedures/tests:  EXAM: Computed tomography, head or brain without contrast material. ACCESSION: [id number] [facility]   CLINICAL INDICATION: 57 years old Female with headache after injury    COMPARISON: None  TECHNIQUE: Axial CT images of the head  from skull base to vertex without contrast.  FINDINGS: Mild asymmetry of the lateral ventricles. Small areas of periventricular white matter hypodensity. There is no midline shift. No mass lesion. There is no evidence of acute infarct. No  acute intracranial hemorrhage. No fractures are evident. The sinuses are pneumatized.     Imaging Results - CT Head Wo Contrast (06/15/2022 10:17 PM EDT) Procedure Note  Krista Blue, MD - 06/15/2022  Formatting of this note might be different from the original. EXAM: Computed tomography, head or brain without contrast material. ACCESSION: [id number] [facility]   CLINICAL INDICATION: 57 years old Female with headache after injury  COMPARISON: None  TECHNIQUE: Axial CT images of the head from skull base to vertex without contrast.  FINDINGS: Mild asymmetry of the lateral ventricles. Small areas of periventricular white matter hypodensity. There is no midline shift. No mass lesion. There is no evidence of acute infarct. No acute intracranial hemorrhage. No fractures are evident. The sinuses are pneumatized.  IMPRESSION: No acute findings.  Consultants: None New medications: ibuprofen Discharge instructions:  follow up here Status: better  DEPRESSION Mood status: controlled Satisfied with current treatment?: yes Symptom severity: mild  Duration of current treatment : chronic Side effects: yes Medication compliance: excellent compliance Psychotherapy/counseling: no  Previous psychiatric medications: cymbalta Depressed mood: no Anxious mood: no Anhedonia: no Significant weight loss or gain: no Insomnia: no  Fatigue: yes Feelings of worthlessness or guilt: no Impaired concentration/indecisiveness: no Suicidal ideations: no Hopelessness: no Crying spells: no    06/25/2022    8:09 AM 03/25/2022    8:17 AM 12/24/2021    8:15 AM 09/24/2021    8:21 AM 06/22/2021    8:11 AM  Depression screen PHQ 2/9  Decreased Interest 0 0 0 0 0  Down, Depressed, Hopeless 0 0 0 0 0  PHQ - 2 Score 0 0 0 0 0  Altered sleeping 1 1 1  0 0  Tired, decreased energy 1 1 1  0 1  Change in  appetite 0 0 0 0 0  Feeling bad or failure about yourself  0 0 0 0 0  Trouble concentrating 0 0 0 0 0  Moving  slowly or fidgety/restless 0 0 0 0 0  Suicidal thoughts 0 0 0 0 0  PHQ-9 Score 2 2 2  0 1  Difficult doing work/chores  Not difficult at all Not difficult at all Not difficult at all Not difficult at all    She currently lives with: husband Menopausal Symptoms: no  Depression Screen done today and results listed below:     06/25/2022    8:09 AM 03/25/2022    8:17 AM 12/24/2021    8:15 AM 09/24/2021    8:21 AM 06/22/2021    8:11 AM  Depression screen PHQ 2/9  Decreased Interest 0 0 0 0 0  Down, Depressed, Hopeless 0 0 0 0 0  PHQ - 2 Score 0 0 0 0 0  Altered sleeping 1 1 1  0 0  Tired, decreased energy 1 1 1  0 1  Change in appetite 0 0 0 0 0  Feeling bad or failure about yourself  0 0 0 0 0  Trouble concentrating 0 0 0 0 0  Moving slowly or fidgety/restless 0 0 0 0 0  Suicidal thoughts 0 0 0 0 0  PHQ-9 Score 2 2 2  0 1  Difficult doing work/chores  Not difficult at all Not difficult at all Not difficult at all Not difficult at all    Past Medical History:  Past Medical History:  Diagnosis Date   Cataract    Diabetes mellitus without complication (HCC)    Hypertension    Neuromuscular disorder [facility])     Surgical History:  Past Surgical History:  Procedure Laterality Date   catarac surgery Left 01/07/2013   CHOLECYSTECTOMY     EYE SURGERY Right 06/2017   Cataract    Medications:  Current Outpatient Medications on File Prior to Visit  Medication Sig   APPLE CIDER VINEGAR PO Take by mouth.   aspirin EC (ASPIRIN LOW DOSE) 81 MG tablet TAKE 1 TABLET(81 MG) BY MOUTH DAILY   Barberry-Oreg Grape-Goldenseal (BERBERINE COMPLEX PO) Take by mouth.   Blood Glucose Monitoring Suppl (ONETOUCH VERIO) w/Device KIT 1 kit by Does not apply route daily.   co-enzyme Q-10 30 MG capsule Take 30 mg by mouth daily.   DULoxetine (CYMBALTA) 20 MG capsule TAKE 1 CAPSULE BY MOUTH DAILY   esomeprazole (NEXIUM) 40 MG capsule TAKE 1 CAPSULE BY MOUTH DAILY   glucose blood (CONTOUR NEXT TEST) test  strip CHECK BLOOD SUGAR DAILY   glucose blood test strip Use as instructed   hydrochlorothiazide (HYDRODIURIL) 25 MG tablet TAKE 1 TABLET BY MOUTH DAILY   hydrocortisone (ANUSOL-HC) 2.5 % rectal cream Place 1 application rectally 2 (two) times daily.   levocetirizine (XYZAL) 5 MG tablet TAKE 1 TABLET(5 MG) BY MOUTH EVERY EVENING   loperamide (IMODIUM A-D) 2 MG tablet Take 1 tablet (2 mg total) by mouth 4 (four) times daily as needed for diarrhea or loose stools.   losartan (COZAAR) 25 MG tablet TAKE 1 TABLET BY MOUTH DAILY   MAGNESIUM SULFATE PO Take by mouth.   metFORMIN (GLUCOPHAGE) 500 MG tablet Take 2 tablets (1,000 mg total) by mouth 2 (two) times daily with a meal.   Multiple Vitamin (MULTI-VITAMINS) TABS Take by mouth.   OneTouch Delica Lancets 33G MISC USE DAILY TO TEST BLOOD SUGAR.   Probiotic Product (PROBIOTIC ADVANCED PO) Take by  mouth.   No current facility-administered medications on file prior to visit.    Allergies:  No Known Allergies  Social History:  Social History   Socioeconomic History   Marital status: Married    Spouse name: Not on file   Number of children: Not on file   Years of education: Not on file   Highest education level: Not on file  Occupational History   Not on file  Tobacco Use   Smoking status: Never   Smokeless tobacco: Never  Vaping Use   Vaping Use: Never used  Substance and Sexual Activity   Alcohol use: Yes    Comment: Occasional glass of wine   Drug use: No   Sexual activity: Yes  Other Topics Concern   Not on file  Social History Narrative   Not on file   Social Determinants of Health   Financial Resource Strain: Not on file  Food Insecurity: Not on file  Transportation Needs: Not on file  Physical Activity: Not on file  Stress: Not on file  Social Connections: Not on file  Intimate Partner Violence: Not on file   Social History   Tobacco Use  Smoking Status Never  Smokeless Tobacco Never   Social History    Substance and Sexual Activity  Alcohol Use Yes   Comment: Occasional glass of wine    Family History:  Family History  Adopted: Yes  Problem Relation Age of Onset   Lung cancer Mother    Breast cancer Sister     Past medical history, surgical history, medications, allergies, family history and social history reviewed with patient today and changes made to appropriate areas of the chart.   Review of Systems  Constitutional:  Positive for diaphoresis. Negative for chills, fever, malaise/fatigue and weight loss.  HENT: Negative.    Eyes: Negative.   Respiratory: Negative.    Cardiovascular: Negative.   Gastrointestinal:  Positive for abdominal pain (RUQ). Negative for blood in stool, constipation, diarrhea, heartburn, melena, nausea and vomiting.  Genitourinary: Negative.   Musculoskeletal: Negative.   Skin: Negative.   Neurological: Negative.   Endo/Heme/Allergies: Negative.   Psychiatric/Behavioral: Negative.     All other ROS negative except what is listed above and in the HPI.      Objective:    BP 125/79   Pulse 76   Temp 98.3 F (36.8 C) (Oral)   Ht 5' 4.5" (1.638 m)   Wt 178 lb 6.4 oz (80.9 kg)   SpO2 99%   BMI 30.15 kg/m   Wt Readings from Last 3 Encounters:  06/25/22 178 lb 6.4 oz (80.9 kg)  03/25/22 177 lb (80.3 kg)  12/24/21 172 lb 3.2 oz (78.1 kg)    Physical Exam Vitals and nursing note reviewed. Exam conducted with a chaperone present.  Constitutional:      General: She is not in acute distress.    Appearance: Normal appearance. She is obese. She is not ill-appearing, toxic-appearing or diaphoretic.  HENT:     Head: Normocephalic and atraumatic.     Right Ear: Tympanic membrane, ear canal and external ear normal. There is no impacted cerumen.     Left Ear: Tympanic membrane, ear canal and external ear normal. There is no impacted cerumen.     Nose: Nose normal. No congestion or rhinorrhea.     Mouth/Throat:     Mouth: Mucous membranes are  moist.     Pharynx: Oropharynx is clear. No oropharyngeal exudate or posterior oropharyngeal erythema.  Eyes:  General: No scleral icterus.       Right eye: No discharge.        Left eye: No discharge.     Extraocular Movements: Extraocular movements intact.     Conjunctiva/sclera: Conjunctivae normal.     Pupils: Pupils are equal, round, and reactive to light.  Neck:     Vascular: No carotid bruit.  Cardiovascular:     Rate and Rhythm: Normal rate and regular rhythm.     Pulses: Normal pulses.     Heart sounds: No murmur heard.    No friction rub. No gallop.  Pulmonary:     Effort: Pulmonary effort is normal. No respiratory distress.     Breath sounds: Normal breath sounds. No stridor. No wheezing, rhonchi or rales.  Chest:     Chest wall: No tenderness.  Breasts:    Right: Normal.     Left: Normal.  Abdominal:     General: Abdomen is flat. Bowel sounds are normal. There is no distension.     Palpations: Abdomen is soft. There is no mass.     Tenderness: There is no abdominal tenderness. There is no right CVA tenderness, left CVA tenderness, guarding or rebound.     Hernia: No hernia is present.  Genitourinary:    Labia:        Right: No rash, tenderness, lesion or injury.        Left: No rash, tenderness, lesion or injury.      Vagina: Normal.     Cervix: Normal.     Uterus: Normal.      Adnexa: Right adnexa normal and left adnexa normal.  Musculoskeletal:        General: No swelling, tenderness, deformity or signs of injury. Normal range of motion.     Cervical back: Normal range of motion and neck supple. No rigidity. No muscular tenderness.     Right lower leg: No edema.     Left lower leg: No edema.  Lymphadenopathy:     Cervical: No cervical adenopathy.  Skin:    General: Skin is warm and dry.     Capillary Refill: Capillary refill takes less than 2 seconds.     Coloration: Skin is not jaundiced or pale.     Findings: No bruising, erythema, lesion or rash.   Neurological:     General: No focal deficit present.     Mental Status: She is alert and oriented to person, place, and time. Mental status is at baseline.     Cranial Nerves: No cranial nerve deficit.     Sensory: No sensory deficit.     Motor: No weakness.     Coordination: Coordination normal.     Gait: Gait normal.     Deep Tendon Reflexes: Reflexes normal.  Psychiatric:        Mood and Affect: Mood normal.        Behavior: Behavior normal.        Thought Content: Thought content normal.        Judgment: Judgment normal.     Results for orders placed or performed in visit on 06/25/22  Microscopic Examination   Urine  Result Value Ref Range   WBC, UA None seen 0 - 5 /hpf   RBC, Urine 0-2 0 - 2 /hpf   Epithelial Cells (non renal) 0-10 0 - 10 /hpf   Mucus, UA Present (A) Not Estab.   Bacteria, UA None seen None seen/Few  Urinalysis, Routine w reflex microscopic  Result Value Ref Range   Specific Gravity, UA 1.020 1.005 - 1.030   pH, UA 5.5 5.0 - 7.5   Color, UA Yellow Yellow   Appearance Ur Clear Clear   Leukocytes,UA Negative Negative   Protein,UA 1+ (A) Negative/Trace   Glucose, UA 2+ (A) Negative   Ketones, UA 1+ (A) Negative   RBC, UA Negative Negative   Bilirubin, UA Negative Negative   Urobilinogen, Ur 0.2 0.2 - 1.0 mg/dL   Nitrite, UA Negative Negative   Microscopic Examination See below:   Bayer DCA Hb A1c Waived  Result Value Ref Range   HB A1C (BAYER DCA - WAIVED) 12.2 (H) 4.8 - 5.6 %  Microalbumin, Urine Waived  Result Value Ref Range   Microalb, Ur Waived 80 (H) 0 - 19 mg/L   Creatinine, Urine Waived 50 10 - 300 mg/dL   Microalb/Creat Ratio >300 (H) <30 mg/g      Assessment & Plan:   Problem List Items Addressed This Visit       Cardiovascular and Mediastinum   Hypertension    Under good control on current regimen. Continue current regimen. Continue to monitor. Call with any concerns. Refills given. Labs drawn today.        Relevant  Medications   pravastatin (PRAVACHOL) 20 MG tablet (Start on 06/26/2022)     Endocrine   Type 2 diabetes mellitus (HCC)    Worse with A1c of 12.2. Had stopped medicine and titrating up on rybelsus. Will increase to 14mg  and recheck 3 months. Call with any concerns. Likely will need jardiance, but has had yeast infections on this previous- will try to get A1c below 10 before starting rybelsus.       Relevant Medications   Semaglutide (RYBELSUS) 14 MG TABS   pravastatin (PRAVACHOL) 20 MG tablet (Start on 06/26/2022)   Other Relevant Orders   Bayer DCA Hb A1c Waived (Completed)   Microalbumin, Urine Waived (Completed)   Hyperlipidemia associated with type 2 diabetes mellitus (HCC)    Under good control on current regimen. Continue current regimen. Continue to monitor. Call with any concerns. Refills given. Labs drawn today.        Relevant Medications   Semaglutide (RYBELSUS) 14 MG TABS   pravastatin (PRAVACHOL) 20 MG tablet (Start on 06/26/2022)     Other   Complicated grief    Under good control on current regimen. Continue current regimen. Continue to monitor. Call with any concerns. Refills given.        Other Visit Diagnoses     Routine general medical examination at a health care facility    -  Primary   Vaccines up to date. Screening labs checked today. Pap done. Mammo and colonoscopy up to date. Continue diet and exercise. Call with any concerns.   Relevant Orders   CBC with Differential/Platelet   Comprehensive metabolic panel   Lipid Panel w/o Chol/HDL Ratio   Cytology - PAP   Urinalysis, Routine w reflex microscopic (Completed)   TSH   Bayer DCA Hb A1c Waived (Completed)   Microalbumin, Urine Waived (Completed)   RUQ pain       Will check RUQ [place]. Await results.   Relevant Orders   US Abdomen Limited RUQ (LIVER/GB)   Acute post-traumatic headache, not intractable       Resolved.   Encounter for screening mammogram for malignant neoplasm of breast       Mammogram  scheduled today.   Relevant Orders   MM 3D SCREENING  MAMMOGRAM BILATERAL BREAST        Follow up plan: Return in about 3 months (around 09/25/2022).   LABORATORY TESTING:  - Pap smear: pap done  IMMUNIZATIONS:   - Tdap: Tetanus vaccination status reviewed: last tetanus booster within 10 years. - Influenza: Postponed to flu season - Pneumovax: Not applicable - Prevnar: Not applicable - COVID: Refused - HPV: Not applicable - Shingrix vaccine: Refused  SCREENING: -Mammogram: Ordered today  - Colonoscopy: Up to date    PATIENT COUNSELING:   Advised to take 1 mg of folate supplement per day if capable of pregnancy.   Sexuality: Discussed sexually transmitted diseases, partner selection, use of condoms, avoidance of unintended pregnancy  and contraceptive alternatives.   Advised to avoid cigarette smoking.  I discussed with the patient that most people either abstain from alcohol or drink within safe limits (<=14/week and <=4 drinks/occasion for males, <=7/weeks and <= 3 drinks/occasion for females) and that the risk for alcohol disorders and other health effects rises proportionally with the number of drinks per week and how often a drinker exceeds daily limits.  Discussed cessation/primary prevention of drug use and availability of treatment for abuse.   Diet: Encouraged to adjust caloric intake to maintain  or achieve ideal body weight, to reduce intake of dietary saturated fat and total fat, to limit sodium intake by avoiding high sodium foods and not adding table salt, and to maintain adequate dietary potassium and calcium preferably from fresh fruits, vegetables, and low-fat dairy products.    stressed the importance of regular exercise  Injury prevention: Discussed safety belts, safety helmets, smoke detector, smoking near bedding or upholstery.   Dental health: Discussed importance of regular tooth brushing, flossing, and dental visits.    NEXT PREVENTATIVE PHYSICAL  DUE IN 1 YEAR. Return in about 3 months (around 09/25/2022).

## 2022-06-25 NOTE — Telephone Encounter (Signed)
Patient has scheduled appointment with provider Dr Laural Benes today 06/25/22 at 8:00 AM.

## 2022-06-25 NOTE — Telephone Encounter (Signed)
-----   Message from Brittany N Russell, CMA sent at 06/17/2022  8:54 AM EDT ----- Patient needs TOC call completed please.   

## 2022-06-25 NOTE — Assessment & Plan Note (Signed)
Worse with A1c of 12.2. Had stopped medicine and titrating up on rybelsus. Will increase to 14mg  and recheck 3 months. Call with any concerns. Likely will need jardiance, but has had yeast infections on this previous- will try to get A1c below 10 before starting rybelsus.

## 2022-06-25 NOTE — Patient Instructions (Signed)
Your Mammogram: 07/23/22 at 7:20 [facility]  Address: [address], [place], [place] [address] Phone: [phone]

## 2022-06-25 NOTE — Assessment & Plan Note (Signed)
Under good control on current regimen. Continue current regimen. Continue to monitor. Call with any concerns. Refills given.   

## 2022-06-25 NOTE — Assessment & Plan Note (Signed)
Under good control on current regimen. Continue current regimen. Continue to monitor. Call with any concerns. Refills given. Labs drawn today.   

## 2022-06-26 ENCOUNTER — Telehealth: Payer: Self-pay | Admitting: Family Medicine

## 2022-06-26 LAB — CBC WITH DIFFERENTIAL/PLATELET
Basophils Absolute: 0.1 10*3/uL (ref 0.0–0.2)
Basos: 1 %
EOS (ABSOLUTE): 0.3 10*3/uL (ref 0.0–0.4)
Eos: 4 %
Hematocrit: 42.7 % (ref 34.0–46.6)
Hemoglobin: 13.8 g/dL (ref 11.1–15.9)
Immature Grans (Abs): 0 10*3/uL (ref 0.0–0.1)
Immature Granulocytes: 0 %
Lymphocytes Absolute: 1.9 10*3/uL (ref 0.7–3.1)
Lymphs: 26 %
MCH: 29.6 pg (ref 26.6–33.0)
MCHC: 32.3 g/dL (ref 31.5–35.7)
MCV: 91 fL (ref 79–97)
Monocytes Absolute: 0.5 10*3/uL (ref 0.1–0.9)
Monocytes: 7 %
Neutrophils Absolute: 4.4 10*3/uL (ref 1.4–7.0)
Neutrophils: 62 %
Platelets: 284 10*3/uL (ref 150–450)
RBC: 4.67 x10E6/uL (ref 3.77–5.28)
RDW: 12.7 % (ref 11.7–15.4)
WBC: 7.3 10*3/uL (ref 3.4–10.8)

## 2022-06-26 LAB — COMPREHENSIVE METABOLIC PANEL
ALT: 19 IU/L (ref 0–32)
AST: 18 IU/L (ref 0–40)
Albumin: 4.2 g/dL (ref 3.8–4.9)
Alkaline Phosphatase: 113 IU/L (ref 44–121)
BUN/Creatinine Ratio: 26 — ABNORMAL HIGH (ref 9–23)
BUN: 23 mg/dL (ref 6–24)
Bilirubin Total: <0.2 mg/dL (ref 0.0–1.2)
CO2: 20 mmol/L (ref 20–29)
Calcium: 9.6 mg/dL (ref 8.7–10.2)
Chloride: 93 mmol/L — ABNORMAL LOW (ref 96–106)
Creatinine, Ser: 0.87 mg/dL (ref 0.57–1.00)
Globulin, Total: 2.8 g/dL (ref 1.5–4.5)
Glucose: 314 mg/dL — ABNORMAL HIGH (ref 70–99)
Potassium: 3.9 mmol/L (ref 3.5–5.2)
Sodium: 132 mmol/L — ABNORMAL LOW (ref 134–144)
Total Protein: 7 g/dL (ref 6.0–8.5)
eGFR: 77 mL/min/{1.73_m2} (ref >59)

## 2022-06-26 LAB — TSH: TSH: 1.92 u[IU]/mL (ref 0.450–4.500)

## 2022-06-26 LAB — LIPID PANEL W/O CHOL/HDL RATIO
Cholesterol, Total: 217 mg/dL — ABNORMAL HIGH (ref 100–199)
HDL: 31 mg/dL — ABNORMAL LOW (ref >39)
LDL Chol Calc (NIH): 101 mg/dL — ABNORMAL HIGH (ref 0–99)
Triglycerides: 500 mg/dL — ABNORMAL HIGH (ref 0–149)
VLDL Cholesterol Cal: 85 mg/dL — ABNORMAL HIGH (ref 5–40)

## 2022-06-26 NOTE — Telephone Encounter (Signed)
Left message for patient to inform her that during transporting her pap it was started to leak and the lab was unable to process the specimen. Called to make patient aware of Dr Henriette Combs recommendations.   OK for [facility] to give note if patient calls back.

## 2022-06-26 NOTE — Telephone Encounter (Signed)
The patient called in stating she would just do this again at her next appt as she doesn't want to come back in to redo anytime soon. Please assist patient further

## 2022-06-26 NOTE — Telephone Encounter (Signed)
Please let patient know and ask her if she'd like to come back or do it next time she comes in

## 2022-06-26 NOTE — Telephone Encounter (Unsigned)
Copied from CRM [id number]. Topic: General - Other >> Jun 26, 2022  8:47 AM Dondra Prader A wrote: Reason for CRM: Tammy with the lab at [facility] states that they received a pap for the pt and is not for sure if this is the pt pap since there is no name on it due to the bag leaking. Tammy states that they are going to have to reject the pap.

## 2022-06-26 NOTE — Telephone Encounter (Signed)
Reached out to patient, she stated that she is fine with doing the PAP again the next time that she comes in.  That is in August.

## 2022-06-27 ENCOUNTER — Encounter: Payer: Self-pay | Admitting: Family Medicine

## 2022-06-27 ENCOUNTER — Telehealth: Payer: Self-pay | Admitting: Family Medicine

## 2022-06-27 NOTE — Telephone Encounter (Signed)
Copied from CRM #[id number]. Topic: General - Other >> Jun 26, 2022  4:27 PM Ja-Kwan M wrote: Reason for CRM: Pt stated the Semaglutide (RYBELSUS) 14 MG TABS needs prior authorization. 

## 2022-06-27 NOTE — Telephone Encounter (Signed)
Reached out to patient via MyChart. Advised patient to reach out to our office if she has any questions or concerns.

## 2022-06-27 NOTE — Telephone Encounter (Signed)
Copied from CRM [id number]. Topic: General - Other >> Jun 26, 2022  4:27 PM Ja-Kwan M wrote: Reason for CRM: Pt stated the Semaglutide (RYBELSUS) 14 MG TABS needs prior authorization.

## 2022-07-02 ENCOUNTER — Ambulatory Visit
Admission: RE | Admit: 2022-07-02 | Discharge: 2022-07-02 | Disposition: A | Discharge status: Home / Self Care | Payer: Managed Care, Other (non HMO) | Source: Ambulatory Visit | Attending: Family Medicine | Admitting: Family Medicine

## 2022-07-02 DIAGNOSIS — R1011 Right upper quadrant pain: Secondary | ICD-10-CM | POA: Insufficient documentation

## 2022-07-22 ENCOUNTER — Other Ambulatory Visit: Payer: Self-pay | Admitting: Family Medicine

## 2022-07-22 NOTE — Telephone Encounter (Signed)
Requested Prescriptions  Pending Prescriptions Disp Refills   aspirin EC (ASPIRIN LOW DOSE) 81 MG tablet [Pharmacy Med Name: ACETYSALICYLIC ACID  81MG   TAB  ENTERIC COATED] 90 tablet 0    Sig: TAKE 1 TABLET BY MOUTH DAILY     Analgesics:  NSAIDS - aspirin Passed - 07/22/2022  8:38 AM      Passed - Cr in normal range and within 360 days    Creatinine, Ser  Date Value Ref Range Status  06/25/2022 0.87 0.57 - 1.00 mg/dL Final         Passed - eGFR is 10 or above and within 360 days    GFR calc Af Amer  Date Value Ref Range Status  10/20/2019 113 >59 mL/min/1.73 Final    Comment:    **In accordance with recommendations from the NKF-ASN Task force,**   [facility] is in the process of updating its eGFR calculation to the   2021 CKD-EPI creatinine equation that estimates kidney function   without a race variable.    GFR calc non Af Amer  Date Value Ref Range Status  10/20/2019 98 >59 mL/min/1.73 Final   eGFR  Date Value Ref Range Status  06/25/2022 77 >59 mL/min/1.73 Final         Passed - Patient is not pregnant      Passed - Valid encounter within last 12 months    Recent Outpatient Visits           3 weeks ago Routine general medical examination at a health care facility   [facility] [place] P, DO   3 months ago Type 2 diabetes mellitus with other neurologic complication, without long-term current use of insulin (HCC)   [place] [facility] [place], Megan P, DO   7 months ago Type 2 diabetes mellitus with other neurologic complication, without long-term current use of insulin (HCC)   [place] [facility] [place], Megan P, DO   10 months ago Type 2 diabetes mellitus with other neurologic complication, without long-term current use of insulin (HCC)   [place] [facility] [place], Megan P, DO   1 year ago Routine general medical examination at a health care facility   [facility] Dorcas Carrow, DO       Future Appointments             In 2 months Dorcas Carrow, DO [facility], [facility]

## 2022-07-23 ENCOUNTER — Inpatient Hospital Stay
Admission: RE | Admit: 2022-07-23 | Discharge: 2022-07-23 | Disposition: A | Discharge status: Home / Self Care | Payer: Self-pay | Source: Ambulatory Visit | Attending: Family Medicine | Admitting: Family Medicine

## 2022-07-23 ENCOUNTER — Ambulatory Visit
Admission: RE | Admit: 2022-07-23 | Discharge: 2022-07-23 | Disposition: A | Discharge status: Home / Self Care | Payer: Managed Care, Other (non HMO) | Source: Ambulatory Visit | Attending: Family Medicine | Admitting: Family Medicine

## 2022-07-23 ENCOUNTER — Other Ambulatory Visit: Payer: Self-pay | Admitting: *Deleted

## 2022-07-23 DIAGNOSIS — Z1231 Encounter for screening mammogram for malignant neoplasm of breast: Secondary | ICD-10-CM

## 2022-08-11 ENCOUNTER — Other Ambulatory Visit: Payer: Self-pay | Admitting: Family Medicine

## 2022-08-12 NOTE — Telephone Encounter (Signed)
Requested Prescriptions  Pending Prescriptions Disp Refills   hydrochlorothiazide (HYDRODIURIL) 25 MG tablet [Pharmacy Med Name: hydroCHLOROthiazide 25 MG Oral Tablet] 90 tablet 1    Sig: TAKE 1 TABLET BY MOUTH DAILY     Cardiovascular: Diuretics - Thiazide Failed - 08/11/2022  4:53 AM      Failed - Na in normal range and within 180 days    Sodium  Date Value Ref Range Status  06/25/2022 132 (L) 134 - 144 mmol/L Final         Passed - Cr in normal range and within 180 days    Creatinine, Ser  Date Value Ref Range Status  06/25/2022 0.87 0.57 - 1.00 mg/dL Final         Passed - K in normal range and within 180 days    Potassium  Date Value Ref Range Status  06/25/2022 3.9 3.5 - 5.2 mmol/L Final         Passed - Last BP in normal range    BP Readings from Last 1 Encounters:  06/25/22 125/79         Passed - Valid encounter within last 6 months    Recent Outpatient Visits           1 month ago Routine general medical examination at a health care facility   [facility], [place] P, DO   4 months ago Type 2 diabetes mellitus with other neurologic complication, without long-term current use of insulin (HCC)   [facility] [place], Megan P, DO   7 months ago Type 2 diabetes mellitus with other neurologic complication, without long-term current use of insulin (HCC)   [facility] [place], Megan P, DO   10 months ago Type 2 diabetes mellitus with other neurologic complication, without long-term current use of insulin (HCC)   [facility] [place], Megan P, DO   1 year ago Routine general medical examination at a health care facility   [facility] [place], [place] P, DO       Future Appointments             In 1 month Johnson, Oralia Rud, DO [place] [facility]             DULoxetine (CYMBALTA) 20 MG capsule [Pharmacy Med Name:  DULoxetine HCl 20 MG Oral Capsule Delayed Release Particles] 90 capsule 1    Sig: TAKE 1 CAPSULE BY MOUTH DAILY     Psychiatry: Antidepressants - SNRI - duloxetine Passed - 08/11/2022  4:53 AM      Passed - Cr in normal range and within 360 days    Creatinine, Ser  Date Value Ref Range Status  06/25/2022 0.87 0.57 - 1.00 mg/dL Final         Passed - eGFR is 30 or above and within 360 days    GFR calc Af Amer  Date Value Ref Range Status  10/20/2019 113 >59 mL/min/1.73 Final    Comment:    **In accordance with recommendations from the NKF-ASN Task force,**   [facility] is in the process of updating its eGFR calculation to the   2021 CKD-EPI creatinine equation that estimates kidney function   without a race variable.    GFR calc non Af Amer  Date Value Ref Range Status  10/20/2019 98 >59 mL/min/1.73 Final   eGFR  Date Value Ref Range Status  06/25/2022 77 >59 mL/min/1.73  Final         Passed - Completed PHQ-2 or PHQ-9 in the last 360 days      Passed - Last BP in normal range    BP Readings from Last 1 Encounters:  06/25/22 125/79         Passed - Valid encounter within last 6 months    Recent Outpatient Visits           1 month ago Routine general medical examination at a health care facility   [facility], [place] P, DO   4 months ago Type 2 diabetes mellitus with other neurologic complication, without long-term current use of insulin (HCC)   [place] [facility] [place], Megan P, DO   7 months ago Type 2 diabetes mellitus with other neurologic complication, without long-term current use of insulin (HCC)   [place] [place], Megan P, DO   10 months ago Type 2 diabetes mellitus with other neurologic complication, without long-term current use of insulin (HCC)   [facility] [place], Megan P, DO   1 year ago Routine general medical examination at a health care facility   [facility] [place], [place] P, DO       Future Appointments             In 1 month Johnson, Megan P, DO [place] [facility]             esomeprazole (NEXIUM) 40 MG capsule [Pharmacy Med Name: Esomeprazole Magnesium 40 MG Oral Capsule Delayed Release] 90 capsule 3    Sig: TAKE 1 CAPSULE BY MOUTH DAILY     Gastroenterology: Proton Pump Inhibitors 2 Passed - 08/11/2022  4:53 AM      Passed - ALT in normal range and within 360 days    ALT  Date Value Ref Range Status  06/25/2022 19 0 - 32 IU/L Final         Passed - AST in normal range and within 360 days    AST  Date Value Ref Range Status  06/25/2022 18 0 - 40 IU/L Final         Passed - Valid encounter within last 12 months    Recent Outpatient Visits           1 month ago Routine general medical examination at a health care facility   [facility], [place] P, DO   4 months ago Type 2 diabetes mellitus with other neurologic complication, without long-term current use of insulin (HCC)   [place] [facility] [place], Megan P, DO   7 months ago Type 2 diabetes mellitus with other neurologic complication, without long-term current use of insulin (HCC)   [place] [facility] [place], Megan P, DO   10 months ago Type 2 diabetes mellitus with other neurologic complication, without long-term current use of insulin (HCC)   [place] [facility] [place], Megan P, DO   1 year ago Routine general medical examination at a health care facility   [facility] Dor[place] Carrow, DO       Future Appointments             In 1 month Laural Benes, Oralia Rud, DO [place] [facility]

## 2022-08-23 ENCOUNTER — Other Ambulatory Visit: Payer: Self-pay | Admitting: Family Medicine

## 2022-08-26 ENCOUNTER — Encounter: Payer: Self-pay | Admitting: Family Medicine

## 2022-08-26 NOTE — Telephone Encounter (Signed)
Requested Prescriptions  Pending Prescriptions Disp Refills   metFORMIN (GLUCOPHAGE) 500 MG tablet [Pharmacy Med Name: metFORMIN HCl 500 MG Oral Tablet] 360 tablet 0    Sig: TAKE 2 TABLETS BY MOUTH TWICE  DAILY WITH MEALS     Endocrinology:  Diabetes - Biguanides Failed - 08/23/2022  2:53 PM      Failed - HBA1C is between 0 and 7.9 and within 180 days    Hemoglobin A1C  Date Value Ref Range Status  11/15/2015 7.4  Final   HB A1C (BAYER DCA - WAIVED)  Date Value Ref Range Status  06/25/2022 12.2 (H) 4.8 - 5.6 % Final    Comment:             Prediabetes: 5.7 - 6.4          Diabetes: >6.4          Glycemic control for adults with diabetes: <7.0          Failed - B12 Level in normal range and within 720 days    No results found for: "VITAMINB12"       Passed - Cr in normal range and within 360 days    Creatinine, Ser  Date Value Ref Range Status  06/25/2022 0.87 0.57 - 1.00 mg/dL Final         Passed - eGFR in normal range and within 360 days    GFR calc Af Amer  Date Value Ref Range Status  10/20/2019 113 >59 mL/min/1.73 Final    Comment:    **In accordance with recommendations from the NKF-ASN Task force,**   [facility] is in the process of updating its eGFR calculation to the   2021 CKD-EPI creatinine equation that estimates kidney function   without a race variable.    GFR calc non Af Amer  Date Value Ref Range Status  10/20/2019 98 >59 mL/min/1.73 Final   eGFR  Date Value Ref Range Status  06/25/2022 77 >59 mL/min/1.73 Final         Passed - Valid encounter within last 6 months    Recent Outpatient Visits           2 months ago Routine general medical examination at a health care facility   [facility], [place] P, DO   5 months ago Type 2 diabetes mellitus with other neurologic complication, without long-term current use of insulin (HCC)   [place] [facility] [place], Megan P, DO   8 months ago Type 2 diabetes  mellitus with other neurologic complication, without long-term current use of insulin (HCC)   [employer] [facility], [facility] P, DO   11 months ago Type 2 diabetes mellitus with other neurologic complication, without long-term current use of insulin (HCC)   [place] [facility] [place], Megan P, DO   1 year ago Routine general medical examination at a health care facility   [facility] [place], [place] P, DO       Future Appointments             In 4 weeks Dorcas Carrow, DO [place] [facility]            Passed - CBC within normal limits and completed in the last 12 months    WBC  Date Value Ref Range Status  06/25/2022 7.3 3.4 - 10.8 x10E3/uL Final   RBC  Date Value Ref Range Status  06/25/2022 4.67  3.77 - 5.28 x10E6/uL Final   Hemoglobin  Date Value Ref Range Status  06/25/2022 13.8 11.1 - 15.9 g/dL Final   Hematocrit  Date Value Ref Range Status  06/25/2022 42.7 34.0 - 46.6 % Final   MCHC  Date Value Ref Range Status  06/25/2022 32.3 31.5 - 35.7 g/dL Final   [facility]  Date Value Ref Range Status  06/25/2022 29.6 26.6 - 33.0 pg Final   MCV  Date Value Ref Range Status  06/25/2022 91 79 - 97 fL Final   No results found for: "PLTCOUNTKUC", "LABPLAT", "POCPLA" RDW  Date Value Ref Range Status  06/25/2022 12.7 11.7 - 15.4 % Final          aspirin EC (ASPIRIN LOW DOSE) 81 MG tablet [Pharmacy Med Name: ACETYSALICYLIC ACID  81MG   TAB  ENTERIC COATED] 90 tablet 0    Sig: TAKE 1 TABLET BY MOUTH DAILY     Analgesics:  NSAIDS - aspirin Passed - 08/23/2022  2:53 PM      Passed - Cr in normal range and within 360 days    Creatinine, Ser  Date Value Ref Range Status  06/25/2022 0.87 0.57 - 1.00 mg/dL Final         Passed - eGFR is 10 or above and within 360 days    GFR calc Af Amer  Date Value Ref Range Status  10/20/2019 113 >59 mL/min/1.73 Final    Comment:    **In accordance  with recommendations from the NKF-ASN Task force,**   [facility] is in the process of updating its eGFR calculation to the   2021 CKD-EPI creatinine equation that estimates kidney function   without a race variable.    GFR calc non Af Amer  Date Value Ref Range Status  10/20/2019 98 >59 mL/min/1.73 Final   eGFR  Date Value Ref Range Status  06/25/2022 77 >59 mL/min/1.73 Final         Passed - Patient is not pregnant      Passed - Valid encounter within last 12 months    Recent Outpatient Visits           2 months ago Routine general medical examination at a health care facility   [facility], [place] P, DO   5 months ago Type 2 diabetes mellitus with other neurologic complication, without long-term current use of insulin (HCC)   [place] [facility] [place], Megan P, DO   8 months ago Type 2 diabetes mellitus with other neurologic complication, without long-term current use of insulin (HCC)   [place] [facility], Megan P, DO   11 months ago Type 2 diabetes mellitus with other neurologic complication, without long-term current use of insulin (HCC)   [facility] [place], Megan P, DO   1 year ago Routine general medical examination at a health care facility   [facility] Dor[facility] Carrow, DO       Future Appointments             In 4 weeks Dorcas Carrow, DO [place] [facility]

## 2022-09-24 ENCOUNTER — Encounter: Payer: Self-pay | Admitting: Family Medicine

## 2022-09-24 ENCOUNTER — Ambulatory Visit: Payer: Managed Care, Other (non HMO) | Admitting: Family Medicine

## 2022-09-24 ENCOUNTER — Other Ambulatory Visit (HOSPITAL_COMMUNITY)
Admission: RE | Admit: 2022-09-24 | Discharge: 2022-09-24 | Disposition: A | Discharge status: Home / Self Care | Payer: Managed Care, Other (non HMO) | Source: Ambulatory Visit | Attending: Family Medicine | Admitting: Family Medicine

## 2022-09-24 VITALS — BP 130/83 | HR 70 | Temp 98.1°F | Wt 177.8 lb

## 2022-09-24 DIAGNOSIS — Z7984 Long term (current) use of oral hypoglycemic drugs: Secondary | ICD-10-CM | POA: Diagnosis not present

## 2022-09-24 DIAGNOSIS — E1149 Type 2 diabetes mellitus with other diabetic neurological complication: Secondary | ICD-10-CM

## 2022-09-24 DIAGNOSIS — Z124 Encounter for screening for malignant neoplasm of cervix: Secondary | ICD-10-CM | POA: Diagnosis present

## 2022-09-24 NOTE — Progress Notes (Signed)
BP 130/83   Pulse 70   Temp 98.1 F (36.7 C) (Oral)   Wt 177 lb 12.8 oz (80.6 kg)   SpO2 95%   BMI 30.05 kg/m    Subjective:    Patient ID: Lori Horne, female    DOB: 1966-01-05, 57 y.o.   MRN: [id number]  HPI: Lori Horne is a 57 y.o. female  Chief Complaint  Patient presents with   Diabetes   Hypertension   DIABETES Hypoglycemic episodes:no Polydipsia/polyuria: no Visual disturbance: no Chest pain: no Paresthesias: no Glucose Monitoring: yes  Accucheck frequency: Daily  Fasting glucose: 200s Taking Insulin?: no Blood Pressure Monitoring: not checking Retinal Examination: Up to Date Foot Exam: Up to Date Diabetic Education: Completed Pneumovax: Up to Date Influenza: declined Aspirin: no  Relevant past medical, surgical, family and social history reviewed and updated as indicated. Interim medical history since our last visit reviewed. Allergies and medications reviewed and updated.  Review of Systems  Constitutional: Negative.   Respiratory: Negative.    Cardiovascular: Negative.   Gastrointestinal: Negative.   Musculoskeletal: Negative.   Psychiatric/Behavioral: Negative.      Per HPI unless specifically indicated above     Objective:    BP 130/83   Pulse 70   Temp 98.1 F (36.7 C) (Oral)   Wt 177 lb 12.8 oz (80.6 kg)   SpO2 95%   BMI 30.05 kg/m   Wt Readings from Last 3 Encounters:  09/24/22 177 lb 12.8 oz (80.6 kg)  06/25/22 178 lb 6.4 oz (80.9 kg)  03/25/22 177 lb (80.3 kg)    Physical Exam Vitals and nursing note reviewed. Exam conducted with a chaperone present.  Constitutional:      General: She is not in acute distress.    Appearance: Normal appearance. She is not ill-appearing, toxic-appearing or diaphoretic.  HENT:     Head: Normocephalic and atraumatic.     Right Ear: External ear normal.     Left Ear: External ear normal.     Nose: Nose normal.     Mouth/Throat:     Mouth: Mucous membranes are moist.     Pharynx:  Oropharynx is clear.  Eyes:     General: No scleral icterus.       Right eye: No discharge.        Left eye: No discharge.     Extraocular Movements: Extraocular movements intact.     Conjunctiva/sclera: Conjunctivae normal.     Pupils: Pupils are equal, round, and reactive to light.  Cardiovascular:     Rate and Rhythm: Normal rate and regular rhythm.     Pulses: Normal pulses.     Heart sounds: Normal heart sounds. No murmur heard.    No friction rub. No gallop.  Pulmonary:     Effort: Pulmonary effort is normal. No respiratory distress.     Breath sounds: Normal breath sounds. No stridor. No wheezing, rhonchi or rales.  Chest:     Chest wall: No tenderness.  Genitourinary:    Labia:        Right: No rash, tenderness, lesion or injury.        Left: No rash, tenderness, lesion or injury.      Vagina: Normal.     Cervix: Normal.     Uterus: Normal.      Adnexa: Right adnexa normal and left adnexa normal.  Musculoskeletal:        General: Normal range of motion.     Cervical back: Normal range  of motion and neck supple.  Skin:    General: Skin is warm and dry.     Capillary Refill: Capillary refill takes less than 2 seconds.     Coloration: Skin is not jaundiced or pale.     Findings: No bruising, erythema, lesion or rash.  Neurological:     General: No focal deficit present.     Mental Status: She is alert and oriented to person, place, and time. Mental status is at baseline.  Psychiatric:        Mood and Affect: Mood normal.        Behavior: Behavior normal.        Thought Content: Thought content normal.        Judgment: Judgment normal.     Results for orders placed or performed in visit on 06/25/22  Microscopic Examination   Urine  Result Value Ref Range   WBC, UA None seen 0 - 5 /hpf   RBC, Urine 0-2 0 - 2 /hpf   Epithelial Cells (non renal) 0-10 0 - 10 /hpf   Mucus, UA Present (A) Not Estab.   Bacteria, UA None seen None seen/Few  CBC with  Differential/Platelet  Result Value Ref Range   WBC 7.3 3.4 - 10.8 x10E3/uL   RBC 4.67 3.77 - 5.28 x10E6/uL   Hemoglobin 13.8 11.1 - 15.9 g/dL   Hematocrit 16.1 09.6 - 46.6 %   MCV 91 79 - 97 fL   MCH 29.6 26.6 - 33.0 pg   MCHC 32.3 31.5 - 35.7 g/dL   RDW 04.5 40.9 - 81.1 %   Platelets 284 150 - 450 x10E3/uL   Neutrophils 62 Not Estab. %   Lymphs 26 Not Estab. %   Monocytes 7 Not Estab. %   Eos 4 Not Estab. %   Basos 1 Not Estab. %   Neutrophils Absolute 4.4 1.4 - 7.0 x10E3/uL   Lymphocytes Absolute 1.9 0.7 - 3.1 x10E3/uL   Monocytes Absolute 0.5 0.1 - 0.9 x10E3/uL   EOS (ABSOLUTE) 0.3 0.0 - 0.4 x10E3/uL   Basophils Absolute 0.1 0.0 - 0.2 x10E3/uL   Immature Granulocytes 0 Not Estab. %   Immature Grans (Abs) 0.0 0.0 - 0.1 x10E3/uL  Comprehensive metabolic panel  Result Value Ref Range   Glucose 314 (H) 70 - 99 mg/dL   BUN 23 6 - 24 mg/dL   Creatinine, Ser 9.14 0.57 - 1.00 mg/dL   eGFR 77 >78 GN/FAO/1.30   BUN/Creatinine Ratio 26 (H) 9 - 23   Sodium 132 (L) 134 - 144 mmol/L   Potassium 3.9 3.5 - 5.2 mmol/L   Chloride 93 (L) 96 - 106 mmol/L   CO2 20 20 - 29 mmol/L   Calcium 9.6 8.7 - 10.2 mg/dL   Total Protein 7.0 6.0 - 8.5 g/dL   Albumin 4.2 3.8 - 4.9 g/dL   Globulin, Total 2.8 1.5 - 4.5 g/dL   Bilirubin Total <8.6 0.0 - 1.2 mg/dL   Alkaline Phosphatase 113 44 - 121 IU/L   AST 18 0 - 40 IU/L   ALT 19 0 - 32 IU/L  Lipid Panel w/o Chol/HDL Ratio  Result Value Ref Range   Cholesterol, Total 217 (H) 100 - 199 mg/dL   Triglycerides 578 (H) 0 - 149 mg/dL   HDL 31 (L) >46 mg/dL   VLDL Cholesterol Cal 85 (H) 5 - 40 mg/dL   LDL Chol Calc (NIH) 962 (H) 0 - 99 mg/dL  Urinalysis, Routine w reflex microscopic  Result  Value Ref Range   Specific Gravity, UA 1.020 1.005 - 1.030   pH, UA 5.5 5.0 - 7.5   Color, UA Yellow Yellow   Appearance Ur Clear Clear   Leukocytes,UA Negative Negative   Protein,UA 1+ (A) Negative/Trace   Glucose, UA 2+ (A) Negative   Ketones, UA 1+ (A)  Negative   RBC, UA Negative Negative   Bilirubin, UA Negative Negative   Urobilinogen, Ur 0.2 0.2 - 1.0 mg/dL   Nitrite, UA Negative Negative   Microscopic Examination See below:   TSH  Result Value Ref Range   TSH 1.920 0.450 - 4.500 uIU/mL  Bayer DCA Hb A1c Waived  Result Value Ref Range   HB A1C (BAYER DCA - WAIVED) 12.2 (H) 4.8 - 5.6 %  Microalbumin, Urine Waived  Result Value Ref Range   Microalb, Ur Waived 80 (H) 0 - 19 mg/L   Creatinine, Urine Waived 50 10 - 300 mg/dL   Microalb/Creat Ratio >300 (H) <30 mg/g      Assessment & Plan:   Problem List Items Addressed This Visit       Endocrine   Type 2 diabetes mellitus (HCC) - Primary    Will likely need medication change as she's been running high- will check A1c and adjust medication as needed. Await results.       Relevant Orders   Hgb A1c w/o eAG   Other Visit Diagnoses     Screening for cervical cancer       Pap done today.   Relevant Orders   Cytology - PAP        Follow up plan: Return in about 3 months (around 12/24/2022).

## 2022-09-24 NOTE — Assessment & Plan Note (Signed)
Will likely need medication change as she's been running high- will check A1c and adjust medication as needed. Await results.

## 2022-09-26 LAB — HGB A1C W/O EAG: Hgb A1c MFr Bld: 11.1 % — ABNORMAL HIGH (ref 4.8–5.6)

## 2022-09-27 LAB — CYTOLOGY - PAP
Adequacy: ABSENT
Comment: NEGATIVE
Diagnosis: NEGATIVE
High risk HPV: NEGATIVE

## 2022-09-29 ENCOUNTER — Other Ambulatory Visit: Payer: Self-pay | Admitting: Family Medicine

## 2022-09-29 MED ORDER — EMPAGLIFLOZIN 25 MG PO TABS: 25.0000 mg | ORAL_TABLET | Freq: Every day | ORAL | 0 refills | Status: DC

## 2022-10-24 ENCOUNTER — Other Ambulatory Visit: Payer: Self-pay | Admitting: Family Medicine

## 2022-10-24 NOTE — Telephone Encounter (Signed)
Requested Prescriptions  Pending Prescriptions Disp Refills   losartan (COZAAR) 25 MG tablet [Pharmacy Med Name: Losartan Potassium 25 MG Oral Tablet] 90 tablet 3    Sig: TAKE 1 TABLET BY MOUTH DAILY     Cardiovascular:  Angiotensin Receptor Blockers Passed - 10/24/2022  4:56 AM      Passed - Cr in normal range and within 180 days    Creatinine, Ser  Date Value Ref Range Status  06/25/2022 0.87 0.57 - 1.00 mg/dL Final         Passed - K in normal range and within 180 days    Potassium  Date Value Ref Range Status  06/25/2022 3.9 3.5 - 5.2 mmol/L Final         Passed - Patient is not pregnant      Passed - Last BP in normal range    BP Readings from Last 1 Encounters:  09/24/22 130/83         Passed - Valid encounter within last 6 months    Recent Outpatient Visits           1 month ago Type 2 diabetes mellitus with other neurologic complication, without long-term current use of insulin (HCC)   [place] [facility] [place], Megan P, DO   4 months ago Routine general medical examination at a health care facility   [facility] [place], [place] P, DO   7 months ago Type 2 diabetes mellitus with other neurologic complication, without long-term current use of insulin (HCC)   [facility] [place], Megan P, DO   10 months ago Type 2 diabetes mellitus with other neurologic complication, without long-term current use of insulin (HCC)   [place] [place], Megan P, DO   1 year ago Type 2 diabetes mellitus with other neurologic complication, without long-term current use of insulin (HCC)   [place] [facility] [place], Oralia Rud, DO       Future Appointments             In 2 months Laural Benes, Oralia Rud, DO [place] [facility]

## 2022-11-01 ENCOUNTER — Telehealth: Payer: Self-pay | Admitting: Family Medicine

## 2022-11-01 NOTE — Telephone Encounter (Signed)
Copied from CRM [id number]. Topic: General - Other >> Nov 01, 2022 11:38 AM Macon Large wrote: Reason for CRM: Pt reports that she needs a form completed for her driver's license. Pt requested that the form be completed today because she needs it to get her license so she can drive to work. Advised pt of the turnaround time for form completion. Pt requests that Dr. Laural Benes or her nurse call her back asap to discuss. Cb#  [phone]

## 2022-11-01 NOTE — Telephone Encounter (Signed)
Patient dropped off document DMV, to be filled out by provider. Patient requested to send it back via Call Patient to pick up within 5-days. Document is located in providers tray at front office.Please advise at Mobile [phone] (mobile)

## 2022-11-04 ENCOUNTER — Encounter: Payer: Self-pay | Admitting: Family Medicine

## 2022-11-04 NOTE — Telephone Encounter (Signed)
Pt states that a nurse does not need to call back. Reporting that husband will come by and pick up FMLA paperwork.

## 2022-11-05 NOTE — Telephone Encounter (Signed)
Yes, husband picked up the paperwork yesterday.

## 2022-11-14 ENCOUNTER — Ambulatory Visit: Payer: Managed Care, Other (non HMO) | Admitting: Family Medicine

## 2022-11-29 ENCOUNTER — Other Ambulatory Visit: Payer: Self-pay | Admitting: Family Medicine

## 2022-12-01 ENCOUNTER — Other Ambulatory Visit: Payer: Self-pay | Admitting: Family Medicine

## 2022-12-02 NOTE — Telephone Encounter (Signed)
Only Metformin is due before upcoming appointment.

## 2022-12-02 NOTE — Telephone Encounter (Signed)
Requested medication (s) are due for refill today:yes  Requested medication (s) are on the active medication list: yes  Last refill:  Rybelsus: 06/25/22 #90 1 RF          Metformin: 08/26/22 #360  Future visit scheduled: yes  Notes to clinic:  Rybelsus not assigned to a protocol                Metformin: Abnormal labs   Requested Prescriptions  Pending Prescriptions Disp Refills   RYBELSUS 14 MG TABS [Pharmacy Med Name: Rybelsus 14 MG Oral Tablet] 90 tablet 3    Sig: TAKE 1 TABLET BY MOUTH DAILY     Off-Protocol Failed - 11/29/2022  4:45 AM      Failed - Medication not assigned to a protocol, review manually.      Passed - Valid encounter within last 12 months    Recent Outpatient Visits           2 months ago Type 2 diabetes mellitus with other neurologic complication, without long-term current use of insulin (HCC)   [place] [facility] [place], Megan P, DO   5 months ago Routine general medical examination at a health care facility   [facility], [place] P, DO   8 months ago Type 2 diabetes mellitus with other neurologic complication, without long-term current use of insulin (HCC)   [facility], Megan P, DO   11 months ago Type 2 diabetes mellitus with other neurologic complication, without long-term current use of insulin (HCC)   [place] [facility] [place], Megan P, DO   1 year ago Type 2 diabetes mellitus with other neurologic complication, without long-term current use of insulin (HCC)   [place] [facility] [place], [place], DO       Future Appointments             In 3 weeks Laural Benes, Oralia Rud, DO [place] [facility]             metFORMIN (GLUCOPHAGE) 500 MG tablet [Pharmacy Med Name: metFORMIN HCl 500 MG Oral Tablet] 360 tablet 3    Sig: TAKE 2 TABLETS BY MOUTH TWICE  DAILY WITH MEALS     Endocrinology:  Diabetes - Biguanides  Failed - 11/29/2022  4:45 AM      Failed - HBA1C is between 0 and 7.9 and within 180 days    Hemoglobin A1C  Date Value Ref Range Status  11/15/2015 7.4  Final   HB A1C (BAYER DCA - WAIVED)  Date Value Ref Range Status  06/25/2022 12.2 (H) 4.8 - 5.6 % Final    Comment:             Prediabetes: 5.7 - 6.4          Diabetes: >6.4          Glycemic control for adults with diabetes: <7.0    Hgb A1c MFr Bld  Date Value Ref Range Status  09/24/2022 11.1 (H) 4.8 - 5.6 % Final    Comment:             Prediabetes: 5.7 - 6.4          Diabetes: >6.4          Glycemic control for adults with diabetes: <7.0          Failed - B12 Level in normal range and within 720 days    No  results found for: "VITAMINB12"       Passed - Cr in normal range and within 360 days    Creatinine, Ser  Date Value Ref Range Status  06/25/2022 0.87 0.57 - 1.00 mg/dL Final         Passed - eGFR in normal range and within 360 days    GFR calc Af Amer  Date Value Ref Range Status  10/20/2019 113 >59 mL/min/1.73 Final    Comment:    **In accordance with recommendations from the NKF-ASN Task force,**   [facility] is in the process of updating its eGFR calculation to the   2021 CKD-EPI creatinine equation that estimates kidney function   without a race variable.    GFR calc non Af Amer  Date Value Ref Range Status  10/20/2019 98 >59 mL/min/1.73 Final   eGFR  Date Value Ref Range Status  06/25/2022 77 >59 mL/min/1.73 Final         Passed - Valid encounter within last 6 months    Recent Outpatient Visits           2 months ago Type 2 diabetes mellitus with other neurologic complication, without long-term current use of insulin (HCC)   [place] [facility] [place], Megan P, DO   5 months ago Routine general medical examination at a health care facility   [facility], [place] P, DO   8 months ago Type 2 diabetes mellitus with other neurologic complication,  without long-term current use of insulin (HCC)   [facility], Megan P, DO   11 months ago Type 2 diabetes mellitus with other neurologic complication, without long-term current use of insulin (HCC)   [facility] [place], Megan P, DO   1 year ago Type 2 diabetes mellitus with other neurologic complication, without long-term current use of insulin (HCC)   [facility] [place], [place], DO       Future Appointments             In 3 weeks Johnson, Oralia Rud, DO [place] [facility]            Passed - CBC within normal limits and completed in the last 12 months    WBC  Date Value Ref Range Status  06/25/2022 7.3 3.4 - 10.8 x10E3/uL Final   RBC  Date Value Ref Range Status  06/25/2022 4.67 3.77 - 5.28 x10E6/uL Final   Hemoglobin  Date Value Ref Range Status  06/25/2022 13.8 11.1 - 15.9 g/dL Final   Hematocrit  Date Value Ref Range Status  06/25/2022 42.7 34.0 - 46.6 % Final   MCHC  Date Value Ref Range Status  06/25/2022 32.3 31.5 - 35.7 g/dL Final   [facility]  Date Value Ref Range Status  06/25/2022 29.6 26.6 - 33.0 pg Final   MCV  Date Value Ref Range Status  06/25/2022 91 79 - 97 fL Final   No results found for: "PLTCOUNTKUC", "LABPLAT", "POCPLA" RDW  Date Value Ref Range Status  06/25/2022 12.7 11.7 - 15.4 % Final

## 2022-12-03 NOTE — Telephone Encounter (Signed)
Requested medication (s) are due for refill today:yes  Requested medication (s) are on the active medication list: yes  Last refill:  09/29/22 #90  Future visit scheduled: yes  Notes to clinic:  Pt has an appt 12/24/22 did not refill in case there are changes   Requested Prescriptions  Pending Prescriptions Disp Refills   JARDIANCE 25 MG TABS tablet [Pharmacy Med Name: Jardiance 25 MG Oral Tablet] 90 tablet 3    Sig: TAKE 1 TABLET BY MOUTH DAILY  BEFORE BREAKFAST     Endocrinology:  Diabetes - SGLT2 Inhibitors Failed - 12/01/2022  5:04 AM      Failed - HBA1C is between 0 and 7.9 and within 180 days    Hemoglobin A1C  Date Value Ref Range Status  11/15/2015 7.4  Final   HB A1C (BAYER DCA - WAIVED)  Date Value Ref Range Status  06/25/2022 12.2 (H) 4.8 - 5.6 % Final    Comment:             Prediabetes: 5.7 - 6.4          Diabetes: >6.4          Glycemic control for adults with diabetes: <7.0    Hgb A1c MFr Bld  Date Value Ref Range Status  09/24/2022 11.1 (H) 4.8 - 5.6 % Final    Comment:             Prediabetes: 5.7 - 6.4          Diabetes: >6.4          Glycemic control for adults with diabetes: <7.0          Passed - Cr in normal range and within 360 days    Creatinine, Ser  Date Value Ref Range Status  06/25/2022 0.87 0.57 - 1.00 mg/dL Final         Passed - eGFR in normal range and within 360 days    GFR calc Af Amer  Date Value Ref Range Status  10/20/2019 113 >59 mL/min/1.73 Final    Comment:    **In accordance with recommendations from the NKF-ASN Task force,**   [facility] is in the process of updating its eGFR calculation to the   2021 CKD-EPI creatinine equation that estimates kidney function   without a race variable.    GFR calc non Af Amer  Date Value Ref Range Status  10/20/2019 98 >59 mL/min/1.73 Final   eGFR  Date Value Ref Range Status  06/25/2022 77 >59 mL/min/1.73 Final         Passed - Valid encounter within last 6 months    Recent  Outpatient Visits           2 months ago Type 2 diabetes mellitus with other neurologic complication, without long-term current use of insulin (HCC)   [place] [facility] [place], Megan P, DO   5 months ago Routine general medical examination at a health care facility   [facility], [place] P, DO   8 months ago Type 2 diabetes mellitus with other neurologic complication, without long-term current use of insulin (HCC)   [place] [facility], Megan P, DO   11 months ago Type 2 diabetes mellitus with other neurologic complication, without long-term current use of insulin (HCC)   [place] [facility] [place], Megan P, DO   1 year ago Type 2 diabetes mellitus with other neurologic complication, without long-term current use of insulin (  [facility])   [place] [facility] [place], Oralia Rud, DO       Future Appointments             In 3 weeks Laural Benes, Oralia Rud, DO [facility]

## 2022-12-24 ENCOUNTER — Encounter: Payer: Self-pay | Admitting: Family Medicine

## 2022-12-24 ENCOUNTER — Ambulatory Visit: Payer: Managed Care, Other (non HMO) | Admitting: Family Medicine

## 2022-12-24 VITALS — BP 116/74 | HR 68 | Wt 173.2 lb

## 2022-12-24 DIAGNOSIS — E1149 Type 2 diabetes mellitus with other diabetic neurological complication: Secondary | ICD-10-CM

## 2022-12-24 DIAGNOSIS — I10 Essential (primary) hypertension: Secondary | ICD-10-CM | POA: Diagnosis not present

## 2022-12-24 DIAGNOSIS — E1169 Type 2 diabetes mellitus with other specified complication: Secondary | ICD-10-CM

## 2022-12-24 DIAGNOSIS — E785 Hyperlipidemia, unspecified: Secondary | ICD-10-CM

## 2022-12-24 DIAGNOSIS — Z7984 Long term (current) use of oral hypoglycemic drugs: Secondary | ICD-10-CM

## 2022-12-24 LAB — BAYER DCA HB A1C WAIVED: HB A1C (BAYER DCA - WAIVED): 9.2 % — ABNORMAL HIGH (ref 4.8–5.6)

## 2022-12-24 MED ORDER — DULOXETINE HCL 20 MG PO CPEP: ORAL_CAPSULE | ORAL | 1 refills | Status: DC

## 2022-12-24 MED ORDER — NYSTATIN 100000 UNIT/GM EX POWD: 1.0000 | Freq: Three times a day (TID) | CUTANEOUS | 3 refills | Status: AC

## 2022-12-24 MED ORDER — ESOMEPRAZOLE MAGNESIUM 40 MG PO CPDR: 40.0000 mg | DELAYED_RELEASE_CAPSULE | Freq: Every day | ORAL | 3 refills | Status: DC

## 2022-12-24 MED ORDER — LEVOCETIRIZINE DIHYDROCHLORIDE 5 MG PO TABS: ORAL_TABLET | ORAL | 3 refills | Status: DC

## 2022-12-24 MED ORDER — HYDROCHLOROTHIAZIDE 25 MG PO TABS: ORAL_TABLET | ORAL | 1 refills | Status: DC

## 2022-12-24 MED ORDER — EMPAGLIFLOZIN 25 MG PO TABS: 25.0000 mg | ORAL_TABLET | Freq: Every day | ORAL | 0 refills | Status: DC

## 2022-12-24 MED ORDER — METFORMIN HCL 500 MG PO TABS: 1000.0000 mg | ORAL_TABLET | Freq: Two times a day (BID) | ORAL | 1 refills | Status: DC

## 2022-12-24 MED ORDER — NYSTATIN 100000 UNIT/GM EX OINT: 1.0000 | TOPICAL_OINTMENT | Freq: Two times a day (BID) | CUTANEOUS | 2 refills | Status: AC

## 2022-12-24 NOTE — Progress Notes (Signed)
BP 116/74   Pulse 68   Wt 173 lb 3.2 oz (78.6 kg)   SpO2 98%   BMI 29.27 kg/m    Subjective:    Patient ID: Lori Horne, female    DOB: 14-Feb-1965, 57 y.o.   MRN: [id number]  HPI: Lori Horne is a 57 y.o. female  Chief Complaint  Patient presents with   Diabetes    Patient requested   DIABETES Hypoglycemic episodes:no Polydipsia/polyuria: no Visual disturbance: no Chest pain: no Paresthesias: no Glucose Monitoring: yes  Accucheck frequency: daily Taking Insulin?: no Blood Pressure Monitoring: not checking Retinal Examination: Up to Date Foot Exam: Up to Date Diabetic Education: Completed Pneumovax: Up to Date Influenza: Not up to Date Aspirin: no  HYPERTENSION / HYPERLIPIDEMIA Satisfied with current treatment? yes Duration of hypertension: chronic BP monitoring frequency: not checking BP medication side effects: no Past BP meds:  hydrochlorothiazide, losartan Duration of hyperlipidemia: chronic Cholesterol medication side effects: no Cholesterol supplements: none Past cholesterol medications: pravastatin Medication compliance: excellent compliance Aspirin: no Recent stressors: no Recurrent headaches: no Visual changes: no Palpitations: no Dyspnea: no Chest pain: no Lower extremity edema: no Dizzy/lightheaded: yes  Relevant past medical, surgical, family and social history reviewed and updated as indicated. Interim medical history since our last visit reviewed. Allergies and medications reviewed and updated.  Review of Systems  Constitutional: Negative.   Respiratory: Negative.    Cardiovascular: Negative.   Musculoskeletal: Negative.   Neurological: Negative.   Psychiatric/Behavioral: Negative.      Per HPI unless specifically indicated above     Objective:    BP 116/74   Pulse 68   Wt 173 lb 3.2 oz (78.6 kg)   SpO2 98%   BMI 29.27 kg/m   Wt Readings from Last 3 Encounters:  12/24/22 173 lb 3.2 oz (78.6 kg)  09/24/22 177 lb 12.8  oz (80.6 kg)  06/25/22 178 lb 6.4 oz (80.9 kg)    Physical Exam Vitals and nursing note reviewed.  Constitutional:      General: She is not in acute distress.    Appearance: Normal appearance. She is not ill-appearing, toxic-appearing or diaphoretic.  HENT:     Head: Normocephalic and atraumatic.     Right Ear: External ear normal.     Left Ear: External ear normal.     Nose: Nose normal.     Mouth/Throat:     Mouth: Mucous membranes are moist.     Pharynx: Oropharynx is clear.  Eyes:     General: No scleral icterus.       Right eye: No discharge.        Left eye: No discharge.     Extraocular Movements: Extraocular movements intact.     Conjunctiva/sclera: Conjunctivae normal.     Pupils: Pupils are equal, round, and reactive to light.  Cardiovascular:     Rate and Rhythm: Normal rate and regular rhythm.     Pulses: Normal pulses.     Heart sounds: Normal heart sounds. No murmur heard.    No friction rub. No gallop.  Pulmonary:     Effort: Pulmonary effort is normal. No respiratory distress.     Breath sounds: Normal breath sounds. No stridor. No wheezing, rhonchi or rales.  Chest:     Chest wall: No tenderness.  Musculoskeletal:        General: Normal range of motion.     Cervical back: Normal range of motion and neck supple.  Skin:    General: Skin  is warm and dry.     Capillary Refill: Capillary refill takes less than 2 seconds.     Coloration: Skin is not jaundiced or pale.     Findings: No bruising, erythema, lesion or rash.  Neurological:     General: No focal deficit present.     Mental Status: She is alert and oriented to person, place, and time. Mental status is at baseline.  Psychiatric:        Mood and Affect: Mood normal.        Behavior: Behavior normal.        Thought Content: Thought content normal.        Judgment: Judgment normal.     Results for orders placed or performed in visit on 09/24/22  Cytology - PAP   Collection Time: 09/24/22  8:32 AM   Result Value Ref Range   High risk HPV Negative    Adequacy      Satisfactory for evaluation; transformation zone component ABSENT.   Diagnosis      - Negative for intraepithelial lesion or malignancy (NILM)   Comment Normal Reference Range HPV - Negative   Hgb A1c w/o eAG   Collection Time: 09/24/22  8:34 AM  Result Value Ref Range   Hgb A1c MFr Bld 11.1 (H) 4.8 - 5.6 %      Assessment & Plan:   Problem List Items Addressed This Visit       Cardiovascular and Mediastinum   Hypertension   Under good control on current regimen. Continue current regimen. Continue to monitor. Call with any concerns. Refills given. Labs drawn today.       Relevant Medications   hydrochlorothiazide (HYDRODIURIL) 25 MG tablet     Endocrine   Type 2 diabetes mellitus (HCC) - Primary   Greatly improved with A1c down to 9.2 from 11.1! Had not restarted jardiance. Will restart and continue other medicines. Recheck 3 months. Call with any concerns.      Relevant Medications   empagliflozin (JARDIANCE) 25 MG TABS tablet   metFORMIN (GLUCOPHAGE) 500 MG tablet   Other Relevant Orders   Bayer DCA Hb A1c Waived   CBC with Differential/Platelet   Lipid Panel w/o Chol/HDL Ratio   Comprehensive metabolic panel   Hyperlipidemia associated with type 2 diabetes mellitus (HCC)   Under good control on current regimen. Continue current regimen. Continue to monitor. Call with any concerns. Refills given. Labs drawn today.        Relevant Medications   empagliflozin (JARDIANCE) 25 MG TABS tablet   hydrochlorothiazide (HYDRODIURIL) 25 MG tablet   metFORMIN (GLUCOPHAGE) 500 MG tablet     Follow up plan: Return in about 3 months (around 03/24/2023).

## 2022-12-24 NOTE — Assessment & Plan Note (Signed)
Under good control on current regimen. Continue current regimen. Continue to monitor. Call with any concerns. Refills given. Labs drawn today.   

## 2022-12-24 NOTE — Assessment & Plan Note (Signed)
Greatly improved with A1c down to 9.2 from 11.1! Had not restarted jardiance. Will restart and continue other medicines. Recheck 3 months. Call with any concerns.

## 2022-12-25 LAB — COMPREHENSIVE METABOLIC PANEL
ALT: 19 [IU]/L (ref 0–32)
AST: 16 [IU]/L (ref 0–40)
Albumin: 4.2 g/dL (ref 3.8–4.9)
Alkaline Phosphatase: 84 [IU]/L (ref 44–121)
BUN/Creatinine Ratio: 21 (ref 9–23)
BUN: 18 mg/dL (ref 6–24)
Bilirubin Total: 0.2 mg/dL (ref 0.0–1.2)
CO2: 22 mmol/L (ref 20–29)
Calcium: 9.5 mg/dL (ref 8.7–10.2)
Chloride: 95 mmol/L — ABNORMAL LOW (ref 96–106)
Creatinine, Ser: 0.87 mg/dL (ref 0.57–1.00)
Globulin, Total: 3.1 g/dL (ref 1.5–4.5)
Glucose: 201 mg/dL — ABNORMAL HIGH (ref 70–99)
Potassium: 3.8 mmol/L (ref 3.5–5.2)
Sodium: 137 mmol/L (ref 134–144)
Total Protein: 7.3 g/dL (ref 6.0–8.5)
eGFR: 77 mL/min/{1.73_m2} (ref >59)

## 2022-12-25 LAB — CBC WITH DIFFERENTIAL/PLATELET
Basophils Absolute: 0.1 10*3/uL (ref 0.0–0.2)
Basos: 1 %
EOS (ABSOLUTE): 0.3 10*3/uL (ref 0.0–0.4)
Eos: 4 %
Hematocrit: 44.3 % (ref 34.0–46.6)
Hemoglobin: 14.6 g/dL (ref 11.1–15.9)
Immature Grans (Abs): 0 10*3/uL (ref 0.0–0.1)
Immature Granulocytes: 0 %
Lymphocytes Absolute: 2 10*3/uL (ref 0.7–3.1)
Lymphs: 24 %
MCH: 30.2 pg (ref 26.6–33.0)
MCHC: 33 g/dL (ref 31.5–35.7)
MCV: 92 fL (ref 79–97)
Monocytes Absolute: 0.7 10*3/uL (ref 0.1–0.9)
Monocytes: 8 %
Neutrophils Absolute: 5.1 10*3/uL (ref 1.4–7.0)
Neutrophils: 63 %
Platelets: 310 10*3/uL (ref 150–450)
RBC: 4.84 x10E6/uL (ref 3.77–5.28)
RDW: 12.5 % (ref 11.7–15.4)
WBC: 8.2 10*3/uL (ref 3.4–10.8)

## 2022-12-25 LAB — LIPID PANEL W/O CHOL/HDL RATIO
Cholesterol, Total: 182 mg/dL (ref 100–199)
HDL: 35 mg/dL — ABNORMAL LOW (ref >39)
LDL Chol Calc (NIH): 85 mg/dL (ref 0–99)
Triglycerides: 381 mg/dL — ABNORMAL HIGH (ref 0–149)
VLDL Cholesterol Cal: 62 mg/dL — ABNORMAL HIGH (ref 5–40)

## 2023-01-13 ENCOUNTER — Other Ambulatory Visit: Payer: Self-pay | Admitting: Family Medicine

## 2023-01-15 NOTE — Telephone Encounter (Signed)
 Lipid panel in date.  Requested Prescriptions  Pending Prescriptions Disp Refills   ASPIRIN  LOW DOSE 81 MG tablet [Pharmacy Med Name: ACETYSALICYLIC ACID  81MG   TAB  ENTERIC COATED] 90 tablet 0    Sig: TAKE 1 TABLET BY MOUTH DAILY     Analgesics:  NSAIDS - aspirin  Passed - 01/15/2023  8:44 AM      Passed - Cr in normal range and within 360 days    Creatinine, Ser  Date Value Ref Range Status  12/24/2022 0.87 0.57 - 1.00 mg/dL Final         Passed - eGFR is 10 or above and within 360 days    GFR calc Af Amer  Date Value Ref Range Status  10/20/2019 113 >59 mL/min/1.73 Final    Comment:    **In accordance with recommendations from the NKF-ASN Task force,**   [facility] is in the process of updating its eGFR calculation to the   2021 CKD-EPI creatinine equation that estimates kidney function   without a race variable.    GFR calc non Af Amer  Date Value Ref Range Status  10/20/2019 98 >59 mL/min/1.73 Final   eGFR  Date Value Ref Range Status  12/24/2022 77 >59 mL/min/1.73 Final         Passed - Patient is not pregnant      Passed - Valid encounter within last 12 months    Recent Outpatient Visits           3 weeks ago Type 2 diabetes mellitus with other neurologic complication, without long-term current use of insulin (HCC)   [place] [facility] [place], Megan P, DO   3 months ago Type 2 diabetes mellitus with other neurologic complication, without long-term current use of insulin (HCC)   [place] [facility] [place], Megan P, DO   6 months ago Routine general medical examination at a health care facility   [place] [facility], [place] P, DO   9 months ago Type 2 diabetes mellitus with other neurologic complication, without long-term current use of insulin (HCC)   [facility] [place], Megan P, DO   1 year ago Type 2 diabetes mellitus with other neurologic complication, without long-term current  use of insulin (HCC)   [facility] [place], [place], DO       Future Appointments             In 2 months Johnson, Megan P, DO [place] [facility]             pravastatin  (PRAVACHOL ) 20 MG tablet [Pharmacy Med Name: Pravastatin  Sodium 20 MG Oral Tablet] 156 tablet 0    Sig: TAKE 1 TABLET BY MOUTH EVERY  MONDAY, WEDNESDAY, AND FRIDAY     Cardiovascular:  Antilipid - Statins Failed - 01/15/2023  8:44 AM      Failed - Lipid Panel in normal range within the last 12 months    Cholesterol, Total  Date Value Ref Range Status  12/24/2022 182 100 - 199 mg/dL Final   Cholesterol Piccolo, Waived  Date Value Ref Range Status  12/08/2017 156 <200 mg/dL Final    Comment:                            Desirable                <200  Borderline High      200- 239                         High                     >239    LDL Chol Calc (NIH)  Date Value Ref Range Status  12/24/2022 85 0 - 99 mg/dL Final   HDL  Date Value Ref Range Status  12/24/2022 35 (L) >39 mg/dL Final   Triglycerides  Date Value Ref Range Status  12/24/2022 381 (H) 0 - 149 mg/dL Final   Triglycerides Piccolo,Waived  Date Value Ref Range Status  12/08/2017 308 (H) <150 mg/dL Final    Comment:                            Normal                   <150                         Borderline High     150 - 199                         High                200 - 499                         Very High                >499          Passed - Patient is not pregnant      Passed - Valid encounter within last 12 months    Recent Outpatient Visits           3 weeks ago Type 2 diabetes mellitus with other neurologic complication, without long-term current use of insulin (HCC)   [place] [facility] [place], Megan P, DO   3 months ago Type 2 diabetes mellitus with other neurologic complication, without long-term current use of insulin (HCC)    [place] [facility] [place], Megan P, DO   6 months ago Routine general medical examination at a health care facility   [facility], [place] P, DO   9 months ago Type 2 diabetes mellitus with other neurologic complication, without long-term current use of insulin (HCC)   [facility] [place], Megan P, DO   1 year ago Type 2 diabetes mellitus with other neurologic complication, without long-term current use of insulin [facility])   [place] [facility] [place], Duwaine SQUIBB, DO       Future Appointments             In 2 months [place], Duwaine SQUIBB, DO  [facility], [facility]

## 2023-02-24 ENCOUNTER — Other Ambulatory Visit: Payer: Self-pay | Admitting: Family Medicine

## 2023-02-25 NOTE — Telephone Encounter (Signed)
 Requested Prescriptions  Pending Prescriptions Disp Refills   empagliflozin (JARDIANCE) 25 MG TABS tablet [Pharmacy Med Name: Jardiance 25 MG Oral Tablet] 90 tablet 1    Sig: TAKE 1 TABLET BY MOUTH DAILY  BEFORE BREAKFAST     Endocrinology:  Diabetes - SGLT2 Inhibitors Failed - 02/25/2023  8:37 AM      Failed - HBA1C is between 0 and 7.9 and within 180 days    Hemoglobin A1C  Date Value Ref Range Status  11/15/2015 7.4  Final   HB A1C (BAYER DCA - WAIVED)  Date Value Ref Range Status  12/24/2022 9.2 (H) 4.8 - 5.6 % Final    Comment:             Prediabetes: 5.7 - 6.4          Diabetes: >6.4          Glycemic control for adults with diabetes: <7.0          Passed - Cr in normal range and within 360 days    Creatinine, Ser  Date Value Ref Range Status  12/24/2022 0.87 0.57 - 1.00 mg/dL Final         Passed - eGFR in normal range and within 360 days    GFR calc Af Amer  Date Value Ref Range Status  10/20/2019 113 >59 mL/min/1.73 Final    Comment:    **In accordance with recommendations from the NKF-ASN Task force,**   [facility] is in the process of updating its eGFR calculation to the   2021 CKD-EPI creatinine equation that estimates kidney function   without a race variable.    GFR calc non Af Amer  Date Value Ref Range Status  10/20/2019 98 >59 mL/min/1.73 Final   eGFR  Date Value Ref Range Status  12/24/2022 77 >59 mL/min/1.73 Final         Passed - Valid encounter within last 6 months    Recent Outpatient Visits           2 months ago Type 2 diabetes mellitus with other neurologic complication, without long-term current use of insulin (HCC)   [place] [facility] [place], Megan P, DO   5 months ago Type 2 diabetes mellitus with other neurologic complication, without long-term current use of insulin (HCC)   [place] [facility] [place], Megan P, DO   8 months ago Routine general medical examination at a health care facility   [facility], [place] P, DO   11 months ago Type 2 diabetes mellitus with other neurologic complication, without long-term current use of insulin (HCC)   [place] [facility] [place], Megan P, DO   1 year ago Type 2 diabetes mellitus with other neurologic complication, without long-term current use of insulin (HCC)   [place] [facility] [place], Oralia Rud, DO       Future Appointments             In 4 weeks Laural Benes, Oralia Rud, DO [place] [facility]

## 2023-03-13 ENCOUNTER — Other Ambulatory Visit: Payer: Self-pay | Admitting: Family Medicine

## 2023-03-14 NOTE — Telephone Encounter (Signed)
 Requested Prescriptions  Pending Prescriptions Disp Refills   pravastatin (PRAVACHOL) 20 MG tablet [Pharmacy Med Name: Pravastatin Sodium 20 MG Oral Tablet] 39 tablet 3    Sig: TAKE 1 TABLET BY MOUTH EVERY  MONDAY, WEDNESDAY, AND FRIDAY     Cardiovascular:  Antilipid - Statins Failed - 03/14/2023  8:34 AM      Failed - Lipid Panel in normal range within the last 12 months    Cholesterol, Total  Date Value Ref Range Status  12/24/2022 182 100 - 199 mg/dL Final   Cholesterol Piccolo, Waived  Date Value Ref Range Status  12/08/2017 156 <200 mg/dL Final    Comment:                            Desirable                <200                         Borderline High      200- 239                         High                     >239    LDL Chol Calc (NIH)  Date Value Ref Range Status  12/24/2022 85 0 - 99 mg/dL Final   HDL  Date Value Ref Range Status  12/24/2022 35 (L) >39 mg/dL Final   Triglycerides  Date Value Ref Range Status  12/24/2022 381 (H) 0 - 149 mg/dL Final   Triglycerides Piccolo,Waived  Date Value Ref Range Status  12/08/2017 308 (H) <150 mg/dL Final    Comment:                            Normal                   <150                         Borderline High     150 - 199                         High                200 - 499                         Very High                >499          Passed - Patient is not pregnant      Passed - Valid encounter within last 12 months    Recent Outpatient Visits           2 months ago Type 2 diabetes mellitus with other neurologic complication, without long-term current use of insulin (HCC)   [place] [facility] [place], Megan P, DO   5 months ago Type 2 diabetes mellitus with other neurologic complication, without long-term current use of insulin (HCC)   [place] [facility] [place], Megan P, DO   8 months ago Routine general medical examination at a health care facility   [facility], [facility] P, DO   11 months ago Type 2 diabetes mellitus with other neurologic complication, without long-term current use of insulin (HCC)   [place] [facility] [place], Megan P, DO   1 year ago Type 2 diabetes mellitus with other neurologic complication, without long-term current use of insulin [facility])   [place] [facility] [place], Oralia Rud, DO       Future Appointments             In 1 week Laural Benes, Oralia Rud, DO [place] [facility], [facility]

## 2023-03-25 ENCOUNTER — Ambulatory Visit: Payer: Managed Care, Other (non HMO) | Admitting: Family Medicine

## 2023-03-25 ENCOUNTER — Encounter: Payer: Self-pay | Admitting: Family Medicine

## 2023-03-25 VITALS — BP 122/72 | HR 66 | Temp 98.6°F | Resp 16 | Ht 64.49 in | Wt 172.0 lb

## 2023-03-25 DIAGNOSIS — Z7984 Long term (current) use of oral hypoglycemic drugs: Secondary | ICD-10-CM

## 2023-03-25 DIAGNOSIS — E1149 Type 2 diabetes mellitus with other diabetic neurological complication: Secondary | ICD-10-CM | POA: Diagnosis not present

## 2023-03-25 LAB — BAYER DCA HB A1C WAIVED: HB A1C (BAYER DCA - WAIVED): 9.2 % — ABNORMAL HIGH (ref 4.8–5.6)

## 2023-03-25 NOTE — Assessment & Plan Note (Signed)
 Stable with A1c of 9.2. Will really work on her diet for the next 6 weeks and monitor her sugars at home. If staying about the same will add glipizide- if running lower will recheck in June. Call with any concerns.

## 2023-03-25 NOTE — Progress Notes (Signed)
 BP 122/72 (BP Location: Left Arm, Patient Position: Sitting, Cuff Size: Normal)   Pulse 66   Temp 98.6 F (37 C) (Oral)   Resp 16   Ht 5' 4.49" (1.638 m)   Wt 172 lb (78 kg)   SpO2 99%   BMI 29.08 kg/m    Subjective:    Patient ID: Lori Horne, female    DOB: 1965-07-05, 58 y.o.   MRN: [id number]  HPI: Lori Horne is a 58 y.o. female  Chief Complaint  Patient presents with   Diabetes   DIABETES Hypoglycemic episodes:no Polydipsia/polyuria: no Visual disturbance: no Chest pain: no Paresthesias: no Glucose Monitoring: yes  Accucheck frequency: Daily Taking Insulin?: no Blood Pressure Monitoring: not checking Retinal Examination: Up to Date Foot Exam: Up to Date Diabetic Education: Completed Pneumovax: Up to Date Influenza: Not up to Date Aspirin: yes  Relevant past medical, surgical, family and social history reviewed and updated as indicated. Interim medical history since our last visit reviewed. Allergies and medications reviewed and updated.  Review of Systems  Constitutional: Negative.   Respiratory: Negative.    Cardiovascular: Negative.   Musculoskeletal: Negative.   Neurological: Negative.   Psychiatric/Behavioral: Negative.      Per HPI unless specifically indicated above     Objective:    BP 122/72 (BP Location: Left Arm, Patient Position: Sitting, Cuff Size: Normal)   Pulse 66   Temp 98.6 F (37 C) (Oral)   Resp 16   Ht 5' 4.49" (1.638 m)   Wt 172 lb (78 kg)   SpO2 99%   BMI 29.08 kg/m   Wt Readings from Last 3 Encounters:  03/25/23 172 lb (78 kg)  12/24/22 173 lb 3.2 oz (78.6 kg)  09/24/22 177 lb 12.8 oz (80.6 kg)    Physical Exam Vitals and nursing note reviewed.  Constitutional:      General: She is not in acute distress.    Appearance: Normal appearance. She is not ill-appearing, toxic-appearing or diaphoretic.  HENT:     Head: Normocephalic and atraumatic.     Right Ear: External ear normal.     Left Ear: External ear  normal.     Nose: Nose normal.     Mouth/Throat:     Mouth: Mucous membranes are moist.     Pharynx: Oropharynx is clear.  Eyes:     General: No scleral icterus.       Right eye: No discharge.        Left eye: No discharge.     Extraocular Movements: Extraocular movements intact.     Conjunctiva/sclera: Conjunctivae normal.     Pupils: Pupils are equal, round, and reactive to light.  Cardiovascular:     Rate and Rhythm: Normal rate and regular rhythm.     Pulses: Normal pulses.     Heart sounds: Normal heart sounds. No murmur heard.    No friction rub. No gallop.  Pulmonary:     Effort: Pulmonary effort is normal. No respiratory distress.     Breath sounds: Normal breath sounds. No stridor. No wheezing, rhonchi or rales.  Chest:     Chest wall: No tenderness.  Musculoskeletal:        General: Normal range of motion.     Cervical back: Normal range of motion and neck supple.  Skin:    General: Skin is warm and dry.     Capillary Refill: Capillary refill takes less than 2 seconds.     Coloration: Skin is not jaundiced  or pale.     Findings: No bruising, erythema, lesion or rash.  Neurological:     General: No focal deficit present.     Mental Status: She is alert and oriented to person, place, and time. Mental status is at baseline.  Psychiatric:        Mood and Affect: Mood normal.        Behavior: Behavior normal.        Thought Content: Thought content normal.        Judgment: Judgment normal.     Results for orders placed or performed in visit on 12/24/22  Bayer DCA Hb A1c Waived   Collection Time: 12/24/22  8:17 AM  Result Value Ref Range   HB A1C (BAYER DCA - WAIVED) 9.2 (H) 4.8 - 5.6 %  CBC with Differential/Platelet   Collection Time: 12/24/22  8:18 AM  Result Value Ref Range   WBC 8.2 3.4 - 10.8 x10E3/uL   RBC 4.84 3.77 - 5.28 x10E6/uL   Hemoglobin 14.6 11.1 - 15.9 g/dL   Hematocrit 16.1 09.6 - 46.6 %   MCV 92 79 - 97 fL   MCH 30.2 26.6 - 33.0 pg   MCHC  33.0 31.5 - 35.7 g/dL   RDW 04.5 40.9 - 81.1 %   Platelets 310 150 - 450 x10E3/uL   Neutrophils 63 Not Estab. %   Lymphs 24 Not Estab. %   Monocytes 8 Not Estab. %   Eos 4 Not Estab. %   Basos 1 Not Estab. %   Neutrophils Absolute 5.1 1.4 - 7.0 x10E3/uL   Lymphocytes Absolute 2.0 0.7 - 3.1 x10E3/uL   Monocytes Absolute 0.7 0.1 - 0.9 x10E3/uL   EOS (ABSOLUTE) 0.3 0.0 - 0.4 x10E3/uL   Basophils Absolute 0.1 0.0 - 0.2 x10E3/uL   Immature Granulocytes 0 Not Estab. %   Immature Grans (Abs) 0.0 0.0 - 0.1 x10E3/uL  Lipid Panel w/o Chol/HDL Ratio   Collection Time: 12/24/22  8:18 AM  Result Value Ref Range   Cholesterol, Total 182 100 - 199 mg/dL   Triglycerides 914 (H) 0 - 149 mg/dL   HDL 35 (L) >78 mg/dL   VLDL Cholesterol Cal 62 (H) 5 - 40 mg/dL   LDL Chol Calc (NIH) 85 0 - 99 mg/dL  Comprehensive metabolic panel   Collection Time: 12/24/22  8:18 AM  Result Value Ref Range   Glucose 201 (H) 70 - 99 mg/dL   BUN 18 6 - 24 mg/dL   Creatinine, Ser 2.95 0.57 - 1.00 mg/dL   eGFR 77 >62 ZH/YQM/5.78   BUN/Creatinine Ratio 21 9 - 23   Sodium 137 134 - 144 mmol/L   Potassium 3.8 3.5 - 5.2 mmol/L   Chloride 95 (L) 96 - 106 mmol/L   CO2 22 20 - 29 mmol/L   Calcium 9.5 8.7 - 10.2 mg/dL   Total Protein 7.3 6.0 - 8.5 g/dL   Albumin 4.2 3.8 - 4.9 g/dL   Globulin, Total 3.1 1.5 - 4.5 g/dL   Bilirubin Total 0.2 0.0 - 1.2 mg/dL   Alkaline Phosphatase 84 44 - 121 IU/L   AST 16 0 - 40 IU/L   ALT 19 0 - 32 IU/L      Assessment & Plan:   Problem List Items Addressed This Visit       Endocrine   Type 2 diabetes mellitus (HCC) - Primary   Stable with A1c of 9.2. Will really work on her diet for the next 6 weeks  and monitor her sugars at home. If staying about the same will add glipizide- if running lower will recheck in June. Call with any concerns.       Relevant Orders   Bayer DCA Hb A1c Waived     Follow up plan: Return in about 6 weeks (around 05/06/2023).

## 2023-04-21 ENCOUNTER — Telehealth: Payer: Self-pay

## 2023-04-21 NOTE — Telephone Encounter (Signed)
 Spoke with patient and form has been prestarted for provider and is in folder for review.

## 2023-04-21 NOTE — Telephone Encounter (Signed)
 Copied from CRM [id number]. Topic: General - Other >> Apr 18, 2023  9:13 AM Everette C wrote: Reason for CRM: The patient would like to know if their PCP will write a document sharing that it is medically unnecessary for them to go through a medical review program of their CD[employer] because they no longer wish to have the license   Please contact the patient further when possible

## 2023-05-06 ENCOUNTER — Encounter: Payer: Self-pay | Admitting: Family Medicine

## 2023-05-06 ENCOUNTER — Ambulatory Visit: Admitting: Family Medicine

## 2023-05-06 VITALS — BP 136/83 | HR 69 | Ht 64.5 in | Wt 165.0 lb

## 2023-05-06 DIAGNOSIS — E1149 Type 2 diabetes mellitus with other diabetic neurological complication: Secondary | ICD-10-CM

## 2023-05-06 NOTE — Progress Notes (Signed)
 BP 136/83 (BP Location: Left Arm, Patient Position: Sitting, Cuff Size: Normal)   Pulse 69   Ht 5' 4.5" (1.638 m)   Wt 165 lb (74.8 kg)   SpO2 99%   BMI 27.88 kg/m    Subjective:    Patient ID: Lori Horne, female    DOB: [birth year], 58 y.o.   MRN: [id number]  HPI: Lori Horne is a 58 y.o. female  Chief Complaint  Patient presents with   Diabetes   DIABETES- has been doing great! Has not been eating any sugars since she was here last time Hypoglycemic episodes:no Polydipsia/polyuria: no Visual disturbance: no Chest pain: no Paresthesias: no Glucose Monitoring: yes  Accucheck frequency: Daily  Fasting glucose: 115- 220 1x, usually in the 130s-150s Taking Insulin?: no Blood Pressure Monitoring: not checking Retinal Examination: Up to Date Foot Exam: Up to Date Diabetic Education: Completed Pneumovax: Up to Date Influenza: Up to Date Aspirin : yes  Relevant past medical, surgical, family and social history reviewed and updated as indicated. Interim medical history since our last visit reviewed. Allergies and medications reviewed and updated.  Review of Systems  Constitutional: Negative.   Respiratory: Negative.    Cardiovascular: Negative.   Gastrointestinal: Negative.   Musculoskeletal: Negative.   Psychiatric/Behavioral: Negative.      Per HPI unless specifically indicated above     Objective:    BP 136/83 (BP Location: Left Arm, Patient Position: Sitting, Cuff Size: Normal)   Pulse 69   Ht 5' 4.5" (1.638 m)   Wt 165 lb (74.8 kg)   SpO2 99%   BMI 27.88 kg/m   Wt Readings from Last 3 Encounters:  05/06/23 165 lb (74.8 kg)  03/25/23 172 lb (78 kg)  12/24/22 173 lb 3.2 oz (78.6 kg)    Physical Exam Vitals and nursing note reviewed.  Constitutional:      General: She is not in acute distress.    Appearance: Normal appearance. She is not ill-appearing, toxic-appearing or diaphoretic.  HENT:     Head: Normocephalic and atraumatic.     Right Ear:  External ear normal.     Left Ear: External ear normal.     Nose: Nose normal.     Mouth/Throat:     Mouth: Mucous membranes are moist.     Pharynx: Oropharynx is clear.  Eyes:     General: No scleral icterus.       Right eye: No discharge.        Left eye: No discharge.     Extraocular Movements: Extraocular movements intact.     Conjunctiva/sclera: Conjunctivae normal.     Pupils: Pupils are equal, round, and reactive to light.  Cardiovascular:     Rate and Rhythm: Normal rate and regular rhythm.     Pulses: Normal pulses.     Heart sounds: Normal heart sounds. No murmur heard.    No friction rub. No gallop.  Pulmonary:     Effort: Pulmonary effort is normal. No respiratory distress.     Breath sounds: Normal breath sounds. No stridor. No wheezing, rhonchi or rales.  Chest:     Chest wall: No tenderness.  Musculoskeletal:        General: Normal range of motion.     Cervical back: Normal range of motion and neck supple.  Skin:    General: Skin is warm and dry.     Capillary Refill: Capillary refill takes less than 2 seconds.     Coloration: Skin is not jaundiced or  pale.     Findings: No bruising, erythema, lesion or rash.  Neurological:     General: No focal deficit present.     Mental Status: She is alert and oriented to person, place, and time. Mental status is at baseline.  Psychiatric:        Mood and Affect: Mood normal.        Behavior: Behavior normal.        Thought Content: Thought content normal.        Judgment: Judgment normal.     Results for orders placed or performed in visit on 03/25/23  Bayer DCA Hb A1c Waived   Collection Time: 03/25/23  8:34 AM  Result Value Ref Range   HB A1C (BAYER DCA - WAIVED) 9.2 (H) 4.8 - 5.6 %      Assessment & Plan:   Problem List Items Addressed This Visit       Endocrine   Type 2 diabetes mellitus (HCC) - Primary   Doing much better! Not due for A1c until June- continue diet and exercise. Call with any concerns.          Follow up plan: Return in about 7 weeks (around 06/26/2023) for physical.

## 2023-05-06 NOTE — Assessment & Plan Note (Signed)
 Doing much better! Not due for A1c until June- continue diet and exercise. Call with any concerns.

## 2023-05-14 ENCOUNTER — Other Ambulatory Visit: Payer: Self-pay | Admitting: Family Medicine

## 2023-05-20 ENCOUNTER — Other Ambulatory Visit: Payer: Self-pay | Admitting: Family Medicine

## 2023-06-15 ENCOUNTER — Other Ambulatory Visit: Payer: Self-pay | Admitting: Family Medicine

## 2023-06-16 NOTE — Telephone Encounter (Signed)
 Requested medications are due for refill today.  yes  Requested medications are on the active medications list.  yes  Last refill. 12/02/2022 #90 1 rf  Future visit scheduled.   yes  Notes to clinic.  Medication not assigned to a protocol. Please review for refill.    Requested Prescriptions  Pending Prescriptions Disp Refills   RYBELSUS  14 MG TABS [Pharmacy Med Name: Rybelsus  14 MG Oral Tablet] 90 tablet 3    Sig: TAKE 1 TABLET BY MOUTH DAILY     Off-Protocol Failed - 06/16/2023  4:48 PM      Failed - Medication not assigned to a protocol, review manually.      Passed - Valid encounter within last 12 months    Recent Outpatient Visits           1 month ago Type 2 diabetes mellitus with other neurologic complication, without long-term current use of insulin (HCC)   [place] [facility] Bartlett, Megan P, DO   2 months ago Type 2 diabetes mellitus with other neurologic complication, without long-term current use of insulin [facility])   [place] [facility] Kelly, Hastings, DO

## 2023-06-27 ENCOUNTER — Ambulatory Visit: Admitting: Family Medicine

## 2023-06-27 ENCOUNTER — Encounter: Payer: Self-pay | Admitting: Family Medicine

## 2023-06-27 VITALS — BP 127/84 | HR 65 | Ht 64.5 in | Wt 158.8 lb

## 2023-06-27 DIAGNOSIS — Z1231 Encounter for screening mammogram for malignant neoplasm of breast: Secondary | ICD-10-CM

## 2023-06-27 DIAGNOSIS — Z7984 Long term (current) use of oral hypoglycemic drugs: Secondary | ICD-10-CM

## 2023-06-27 DIAGNOSIS — E785 Hyperlipidemia, unspecified: Secondary | ICD-10-CM | POA: Diagnosis not present

## 2023-06-27 DIAGNOSIS — E1169 Type 2 diabetes mellitus with other specified complication: Secondary | ICD-10-CM | POA: Diagnosis not present

## 2023-06-27 DIAGNOSIS — E1149 Type 2 diabetes mellitus with other diabetic neurological complication: Secondary | ICD-10-CM | POA: Diagnosis not present

## 2023-06-27 DIAGNOSIS — I10 Essential (primary) hypertension: Secondary | ICD-10-CM | POA: Diagnosis not present

## 2023-06-27 DIAGNOSIS — Z0000 Encounter for general adult medical examination without abnormal findings: Secondary | ICD-10-CM

## 2023-06-27 LAB — MICROALBUMIN, URINE WAIVED
Creatinine, Urine Waived: 50 mg/dL (ref 10–300)
Microalb, Ur Waived: 30 mg/L — ABNORMAL HIGH (ref 0–19)

## 2023-06-27 LAB — BAYER DCA HB A1C WAIVED: HB A1C (BAYER DCA - WAIVED): 7.5 % — ABNORMAL HIGH (ref 4.8–5.6)

## 2023-06-27 MED ORDER — DULOXETINE HCL 20 MG PO CPEP: ORAL_CAPSULE | ORAL | 1 refills | Status: DC

## 2023-06-27 MED ORDER — ESOMEPRAZOLE MAGNESIUM 40 MG PO CPDR: 40.0000 mg | DELAYED_RELEASE_CAPSULE | Freq: Every day | ORAL | 3 refills | Status: AC

## 2023-06-27 MED ORDER — EMPAGLIFLOZIN 25 MG PO TABS: 25.0000 mg | ORAL_TABLET | Freq: Every day | ORAL | 1 refills | Status: DC

## 2023-06-27 MED ORDER — RYBELSUS 14 MG PO TABS: 1.0000 | ORAL_TABLET | Freq: Every day | ORAL | 1 refills | Status: DC

## 2023-06-27 MED ORDER — METFORMIN HCL 500 MG PO TABS: 1000.0000 mg | ORAL_TABLET | Freq: Two times a day (BID) | ORAL | 1 refills | Status: DC

## 2023-06-27 NOTE — Assessment & Plan Note (Signed)
 Under good control on current regimen. Continue current regimen. Continue to monitor. Call with any concerns. Refills given. Labs drawn today.

## 2023-06-27 NOTE — Patient Instructions (Signed)
 Please call to schedule your mammogram and/or bone density: [facility]  Address: [address], [place], [place] [address] Phone: [phone]  [facility] [address] [place],  [place]  [address] Phone: [phone]

## 2023-06-27 NOTE — Assessment & Plan Note (Signed)
 Doing great with A1c down to 7.5 from 9.2! Will continue current regimen. Keep up the great work!

## 2023-06-27 NOTE — Progress Notes (Signed)
 BP 127/84 (BP Location: Left Arm, Patient Position: Sitting, Cuff Size: Normal)   Pulse 65   Ht 5' 4.5 (1.638 m)   Wt 158 lb 12.8 oz (72 kg)   SpO2 97%   BMI 26.84 kg/m    Subjective:    Patient ID: Lori Horne, female    DOB: 07-17-1965, 58 y.o.   MRN: [id number]  HPI: Lori Horne is a 58 y.o. female presenting on 06/27/2023 for comprehensive medical examination. Current medical complaints include:  DIABETES Hypoglycemic episodes:no Polydipsia/polyuria: no Visual disturbance: no Chest pain: no Paresthesias: no Glucose Monitoring: yes  Accucheck frequency: Daily  Fasting glucose: 120s-200 Taking Insulin?: no Blood Pressure Monitoring: not checking Retinal Examination: Up to Date Foot Exam: Up to Date Diabetic Education: Completed Pneumovax: Up to Date Influenza: Up to Date Aspirin : yes  HYPERTENSION / HYPERLIPIDEMIA Satisfied with current treatment? no Duration of hypertension: chronic BP monitoring frequency: not checking BP medication side effects: no Past BP meds: losartan , hctz Duration of hyperlipidemia: chronic Cholesterol medication side effects: no Cholesterol supplements: none Past cholesterol medications: pravastatin  Medication compliance: excellent compliance Aspirin : yes Recent stressors: no Recurrent headaches: no Visual changes: no Palpitations: no Dyspnea: no Chest pain: no Lower extremity edema: no Dizzy/lightheaded: no  She currently lives with: husband Menopausal Symptoms: no  Depression Screen done today and results listed below:     06/27/2023    8:03 AM 05/06/2023    8:35 AM 03/25/2023    8:23 AM 12/24/2022    8:18 AM 09/24/2022    8:10 AM  Depression screen PHQ 2/9  Decreased Interest 0 0 0 0 0  Down, Depressed, Hopeless 0 0 0 0 0  PHQ - 2 Score 0 0 0 0 0  Altered sleeping 0 0 0 1 0  Tired, decreased energy 0 0 1 1 1   Change in appetite 0 0 0 0 0  Feeling bad or failure about yourself  0 0 0 0 0  Trouble concentrating 0  0 0 0 0  Moving slowly or fidgety/restless 0 0 0 0 0  Suicidal thoughts 0 0 0 0 0  PHQ-9 Score 0 0 1 2 1   Difficult doing work/chores Not difficult at all  Not difficult at all Not difficult at all Not difficult at all    Past Medical History:  Past Medical History:  Diagnosis Date   Cataract    Diabetes mellitus without complication (HCC)    Hypertension    Neuromuscular disorder [facility])     Surgical History:  Past Surgical History:  Procedure Laterality Date   catarac surgery Left 01/07/2013   CHOLECYSTECTOMY     EYE SURGERY Right 06/2017   Cataract    Medications:  Current Outpatient Medications on File Prior to Visit  Medication Sig   APPLE CIDER VINEGAR PO Take by mouth.   ASPIRIN  LOW DOSE 81 MG tablet TAKE 1 TABLET BY MOUTH DAILY   Barberry-Oreg Grape-Goldenseal (BERBERINE COMPLEX PO) Take by mouth.   Blood Glucose Monitoring Suppl (ONETOUCH VERIO) w/Device KIT 1 kit by Does not apply route daily.   co-enzyme Q-10 30 MG capsule Take 30 mg by mouth daily.   glucose blood (CONTOUR NEXT TEST) test strip CHECK BLOOD SUGAR DAILY   glucose blood test strip Use as instructed   hydrochlorothiazide  (HYDRODIURIL ) 25 MG tablet TAKE 1 TABLET BY MOUTH DAILY   hydrocortisone  (ANUSOL -HC) 2.5 % rectal cream Place 1 application rectally 2 (two) times daily.   levocetirizine (XYZAL ) 5 MG tablet TAKE 1  TABLET(5 MG) BY MOUTH EVERY EVENING   loperamide  (IMODIUM  A-D) 2 MG tablet Take 1 tablet (2 mg total) by mouth 4 (four) times daily as needed for diarrhea or loose stools.   losartan  (COZAAR ) 25 MG tablet TAKE 1 TABLET BY MOUTH DAILY   MAGNESIUM  SULFATE PO Take by mouth.   Multiple Vitamin (MULTI-VITAMINS) TABS Take by mouth.   nystatin  (MYCOSTATIN /NYSTOP ) powder Apply 1 Application topically 3 (three) times daily.   nystatin  ointment (MYCOSTATIN ) Apply 1 Application topically 2 (two) times daily.   OneTouch Delica Lancets 33G MISC USE DAILY TO TEST BLOOD SUGAR.   pravastatin  (PRAVACHOL )  20 MG tablet TAKE 1 TABLET BY MOUTH EVERY  MONDAY, WEDNESDAY, AND FRIDAY   Probiotic Product (PROBIOTIC ADVANCED PO) Take by mouth.   No current facility-administered medications on file prior to visit.    Allergies:  No Known Allergies  Social History:  Social History   Socioeconomic History   Marital status: Married    Spouse name: Not on file   Number of children: Not on file   Years of education: Not on file   Highest education level: Some college, no degree  Occupational History   Not on file  Tobacco Use   Smoking status: Never   Smokeless tobacco: Never  Vaping Use   Vaping status: Never Used  Substance and Sexual Activity   Alcohol use: Yes    Comment: Occasional glass of wine   Drug use: No   Sexual activity: Yes  Other Topics Concern   Not on file  Social History Narrative   Not on file   Social Drivers of Health   Financial Resource Strain: Low Risk  (06/23/2023)   Overall Financial Resource Strain (CARDIA)    Difficulty of Paying Living Expenses: Not hard at all  Food Insecurity: No Food Insecurity (06/23/2023)   Hunger Vital Sign    Worried About Running Out of Food in the Last Year: Never true    Ran Out of Food in the Last Year: Never true  Transportation Needs: No Transportation Needs (06/23/2023)   PRAPARE - Administrator, Civil Service (Medical): No    Lack of Transportation (Non-Medical): No  Physical Activity: Insufficiently Active (06/23/2023)   Exercise Vital Sign    Days of Exercise per Week: 2 days    Minutes of Exercise per Session: 20 min  Stress: No Stress Concern Present (06/23/2023)   [employer] of Occupational Health - Occupational Stress Questionnaire    Feeling of Stress: Not at all  Social Connections: Socially Integrated (06/23/2023)   Social Connection and Isolation Panel    Frequency of Communication with Friends and Family: More than three times a week    Frequency of Social Gatherings with Friends and Family:  Once a week    Attends Religious Services: More than 4 times per year    Active Member of [employer] or Organizations: Yes    Attends Engineer, structural: More than 4 times per year    Marital Status: Married  Catering manager Violence: Not At Risk (06/27/2023)   Humiliation, Afraid, Rape, and Kick questionnaire    Fear of Current or Ex-Partner: No    Emotionally Abused: No    Physically Abused: No    Sexually Abused: No   Social History   Tobacco Use  Smoking Status Never  Smokeless Tobacco Never   Social History   Substance and Sexual Activity  Alcohol Use Yes   Comment: Occasional glass of wine  Family History:  Family History  Adopted: Yes  Problem Relation Age of Onset   Lung cancer Mother        large cell   Cancer Mother    Breast cancer Sister        1/2 sister maturnal ? age   Hemochromatosis Sister    Cancer Sister     Past medical history, surgical history, medications, allergies, family history and social history reviewed with patient today and changes made to appropriate areas of the chart.   Review of Systems  Constitutional: Negative.   HENT: Negative.    Eyes: Negative.   Respiratory: Negative.    Cardiovascular: Negative.   Gastrointestinal: Negative.   Genitourinary: Negative.   Musculoskeletal: Negative.   Skin: Negative.   Neurological: Negative.   Endo/Heme/Allergies: Negative.   Psychiatric/Behavioral: Negative.     All other ROS negative except what is listed above and in the HPI.      Objective:    BP 127/84 (BP Location: Left Arm, Patient Position: Sitting, Cuff Size: Normal)   Pulse 65   Ht 5' 4.5 (1.638 m)   Wt 158 lb 12.8 oz (72 kg)   SpO2 97%   BMI 26.84 kg/m   Wt Readings from Last 3 Encounters:  06/27/23 158 lb 12.8 oz (72 kg)  05/06/23 165 lb (74.8 kg)  03/25/23 172 lb (78 kg)    Physical Exam Vitals and nursing note reviewed.  Constitutional:      General: She is not in acute distress.    Appearance:  Normal appearance. She is not ill-appearing, toxic-appearing or diaphoretic.  HENT:     Head: Normocephalic and atraumatic.     Right Ear: Tympanic membrane, ear canal and external ear normal. There is no impacted cerumen.     Left Ear: Tympanic membrane, ear canal and external ear normal. There is no impacted cerumen.     Nose: Nose normal. No congestion or rhinorrhea.     Mouth/Throat:     Mouth: Mucous membranes are moist.     Pharynx: Oropharynx is clear. No oropharyngeal exudate or posterior oropharyngeal erythema.   Eyes:     General: No scleral icterus.       Right eye: No discharge.        Left eye: No discharge.     Extraocular Movements: Extraocular movements intact.     Conjunctiva/sclera: Conjunctivae normal.     Pupils: Pupils are equal, round, and reactive to light.   Neck:     Vascular: No carotid bruit.   Cardiovascular:     Rate and Rhythm: Normal rate and regular rhythm.     Pulses: Normal pulses.     Heart sounds: No murmur heard.    No friction rub. No gallop.  Pulmonary:     Effort: Pulmonary effort is normal. No respiratory distress.     Breath sounds: Normal breath sounds. No stridor. No wheezing, rhonchi or rales.  Chest:     Chest wall: No tenderness.  Abdominal:     General: Abdomen is flat. Bowel sounds are normal. There is no distension.     Palpations: Abdomen is soft. There is no mass.     Tenderness: There is no abdominal tenderness. There is no right CVA tenderness, left CVA tenderness, guarding or rebound.     Hernia: No hernia is present.  Genitourinary:    Comments: Breast and pelvic exams deferred with shared decision making  Musculoskeletal:        General:  No swelling, tenderness, deformity or signs of injury.     Cervical back: Normal range of motion and neck supple. No rigidity. No muscular tenderness.     Right lower leg: No edema.     Left lower leg: No edema.  Lymphadenopathy:     Cervical: No cervical adenopathy.   Skin:     General: Skin is warm and dry.     Capillary Refill: Capillary refill takes less than 2 seconds.     Coloration: Skin is not jaundiced or pale.     Findings: No bruising, erythema, lesion or rash.   Neurological:     General: No focal deficit present.     Mental Status: She is alert and oriented to person, place, and time. Mental status is at baseline.     Cranial Nerves: No cranial nerve deficit.     Sensory: No sensory deficit.     Motor: No weakness.     Coordination: Coordination normal.     Gait: Gait normal.     Deep Tendon Reflexes: Reflexes normal.   Psychiatric:        Mood and Affect: Mood normal.        Behavior: Behavior normal.        Thought Content: Thought content normal.        Judgment: Judgment normal.     Results for orders placed or performed in visit on 03/25/23  Bayer DCA Hb A1c Waived   Collection Time: 03/25/23  8:34 AM  Result Value Ref Range   HB A1C (BAYER DCA - WAIVED) 9.2 (H) 4.8 - 5.6 %      Assessment & Plan:   Problem List Items Addressed This Visit       Cardiovascular and Mediastinum   Hypertension   Under good control on current regimen. Continue current regimen. Continue to monitor. Call with any concerns. Refills given. Labs drawn today.       Relevant Orders   Microalbumin, Urine Waived     Endocrine   Type 2 diabetes mellitus (HCC)   Doing great with A1c down to 7.5 from 9.2! Will continue current regimen. Keep up the great work!       Relevant Medications   empagliflozin  (JARDIANCE ) 25 MG TABS tablet   metFORMIN  (GLUCOPHAGE ) 500 MG tablet   Semaglutide  (RYBELSUS ) 14 MG TABS   Other Relevant Orders   Bayer DCA Hb A1c Waived   Microalbumin, Urine Waived   Hyperlipidemia associated with type 2 diabetes mellitus (HCC)   Under good control on current regimen. Continue current regimen. Continue to monitor. Call with any concerns. Refills given. Labs drawn today.        Relevant Medications   empagliflozin  (JARDIANCE ) 25  MG TABS tablet   metFORMIN  (GLUCOPHAGE ) 500 MG tablet   Semaglutide  (RYBELSUS ) 14 MG TABS   Other Visit Diagnoses       Routine general medical examination at a health care facility    -  Primary   Vaccines up to date/declined. Screening labs checked today. Pap up to date. Mammo ordered. Cologuard at home. Continue diet and exercise. Call with any concerns   Relevant Orders   CBC with Differential/Platelet   Comprehensive metabolic panel with GFR   Lipid Panel w/o Chol/HDL Ratio   TSH   Bayer DCA Hb A1c Waived   Microalbumin, Urine Waived     Encounter for screening mammogram for malignant neoplasm of breast       Mammo ordered today.  Relevant Orders   MM 3D SCREENING MAMMOGRAM BILATERAL BREAST        Follow up plan: Return in about 3 months (around 09/27/2023).   LABORATORY TESTING:  - Pap smear: pap done  IMMUNIZATIONS:   - Tdap: Tetanus vaccination status reviewed: last tetanus booster within 10 years. - Influenza: Postponed to flu season - Pneumovax: Refused - Prevnar: Refused - COVID: Refused - HPV: Not applicable - Shingrix vaccine: Refused  SCREENING: -Mammogram: Ordered today  - Colonoscopy: has cologuard at home   PATIENT COUNSELING:   Advised to take 1 mg of folate supplement per day if capable of pregnancy.   Sexuality: Discussed sexually transmitted diseases, partner selection, use of condoms, avoidance of unintended pregnancy  and contraceptive alternatives.   Advised to avoid cigarette smoking.  I discussed with the patient that most people either abstain from alcohol or drink within safe limits (<=14/week and <=4 drinks/occasion for males, <=7/weeks and <= 3 drinks/occasion for females) and that the risk for alcohol disorders and other health effects rises proportionally with the number of drinks per week and how often a drinker exceeds daily limits.  Discussed cessation/primary prevention of drug use and availability of treatment for abuse.    Diet: Encouraged to adjust caloric intake to maintain  or achieve ideal body weight, to reduce intake of dietary saturated fat and total fat, to limit sodium intake by avoiding high sodium foods and not adding table salt, and to maintain adequate dietary potassium and calcium preferably from fresh fruits, vegetables, and low-fat dairy products.    stressed the importance of regular exercise  Injury prevention: Discussed safety belts, safety helmets, smoke detector, smoking near bedding or upholstery.   Dental health: Discussed importance of regular tooth brushing, flossing, and dental visits.    NEXT PREVENTATIVE PHYSICAL DUE IN 1 YEAR. Return in about 3 months (around 09/27/2023).

## 2023-06-28 LAB — COMPREHENSIVE METABOLIC PANEL WITH GFR
ALT: 15 IU/L (ref 0–32)
AST: 18 IU/L (ref 0–40)
Albumin: 4.3 g/dL (ref 3.8–4.9)
Alkaline Phosphatase: 72 IU/L (ref 44–121)
BUN/Creatinine Ratio: 32 — ABNORMAL HIGH (ref 12–28)
BUN: 25 mg/dL (ref 8–27)
Bilirubin Total: 0.3 mg/dL (ref 0.0–1.2)
CO2: 21 mmol/L (ref 20–29)
Calcium: 9.5 mg/dL (ref 8.7–10.3)
Chloride: 99 mmol/L (ref 96–106)
Creatinine, Ser: 0.78 mg/dL (ref 0.57–1.00)
Globulin, Total: 3.2 g/dL (ref 1.5–4.5)
Glucose: 129 mg/dL — ABNORMAL HIGH (ref 70–99)
Potassium: 3.9 mmol/L (ref 3.5–5.2)
Sodium: 139 mmol/L (ref 134–144)
Total Protein: 7.5 g/dL (ref 6.0–8.5)
eGFR: 87 mL/min/{1.73_m2} (ref >59)

## 2023-06-28 LAB — CBC WITH DIFFERENTIAL/PLATELET
Basophils Absolute: 0.1 10*3/uL (ref 0.0–0.2)
Basos: 1 %
EOS (ABSOLUTE): 0.4 10*3/uL (ref 0.0–0.4)
Eos: 6 %
Hematocrit: 47.2 % — ABNORMAL HIGH (ref 34.0–46.6)
Hemoglobin: 15.2 g/dL (ref 11.1–15.9)
Immature Grans (Abs): 0 10*3/uL (ref 0.0–0.1)
Immature Granulocytes: 0 %
Lymphocytes Absolute: 1.8 10*3/uL (ref 0.7–3.1)
Lymphs: 24 %
MCH: 30.3 pg (ref 26.6–33.0)
MCHC: 32.2 g/dL (ref 31.5–35.7)
MCV: 94 fL (ref 79–97)
Monocytes Absolute: 0.5 10*3/uL (ref 0.1–0.9)
Monocytes: 7 %
Neutrophils Absolute: 4.6 10*3/uL (ref 1.4–7.0)
Neutrophils: 62 %
Platelets: 306 10*3/uL (ref 150–450)
RBC: 5.02 x10E6/uL (ref 3.77–5.28)
RDW: 13.2 % (ref 11.7–15.4)
WBC: 7.4 10*3/uL (ref 3.4–10.8)

## 2023-06-28 LAB — LIPID PANEL W/O CHOL/HDL RATIO
Cholesterol, Total: 194 mg/dL (ref 100–199)
HDL: 36 mg/dL — ABNORMAL LOW (ref >39)
LDL Chol Calc (NIH): 113 mg/dL — ABNORMAL HIGH (ref 0–99)
Triglycerides: 256 mg/dL — ABNORMAL HIGH (ref 0–149)
VLDL Cholesterol Cal: 45 mg/dL — ABNORMAL HIGH (ref 5–40)

## 2023-06-28 LAB — TSH: TSH: 1.7 u[IU]/mL (ref 0.450–4.500)

## 2023-06-30 ENCOUNTER — Ambulatory Visit: Payer: Self-pay | Admitting: Family Medicine

## 2023-07-08 ENCOUNTER — Telehealth: Payer: Self-pay | Admitting: Family Medicine

## 2023-07-08 NOTE — Telephone Encounter (Signed)
 Do you know if this was completed for the patient?

## 2023-07-08 NOTE — Telephone Encounter (Signed)
 Copied from CRM [id number]. Topic: General - Other >> Jul 07, 2023  1:07 PM Delon DASEN wrote: Reason for CRM: Patient checking to see if Wellness form was sent to [employer]- [phone]

## 2023-07-09 NOTE — Telephone Encounter (Signed)
 Copied from CRM [id number]. Topic: General - Other >> Jul 07, 2023  1:07 PM Delon DASEN wrote: Reason for CRM: Patient checking to see if Wellness form was sent to [employer]- [phone] >> Jul 09, 2023 10:09 AM Larissa S wrote: Patient calling to check the status of wellness form. She asked to be called or send a Mychart message once completed.

## 2023-07-14 NOTE — Telephone Encounter (Signed)
 Patient calling to check on status of wellness form.   Requesting call back or mychart message, [phone]

## 2023-07-16 ENCOUNTER — Encounter: Payer: Self-pay | Admitting: Family Medicine

## 2023-07-18 NOTE — Telephone Encounter (Signed)
 Sent message to patient about faxed form and form for her to pick up. Patient did message back and respond

## 2023-08-18 ENCOUNTER — Other Ambulatory Visit: Payer: Self-pay | Admitting: Family Medicine

## 2023-08-21 NOTE — Telephone Encounter (Signed)
 Requested Prescriptions  Pending Prescriptions Disp Refills   hydrochlorothiazide  (HYDRODIURIL ) 25 MG tablet [Pharmacy Med Name: hydroCHLOROthiazide  25 MG Oral Tablet] 90 tablet 1    Sig: TAKE 1 TABLET BY MOUTH DAILY     Cardiovascular: Diuretics - Thiazide Passed - 08/21/2023 11:11 AM      Passed - Cr in normal range and within 180 days    Creatinine, Ser  Date Value Ref Range Status  06/27/2023 0.78 0.57 - 1.00 mg/dL Final         Passed - K in normal range and within 180 days    Potassium  Date Value Ref Range Status  06/27/2023 3.9 3.5 - 5.2 mmol/L Final         Passed - Na in normal range and within 180 days    Sodium  Date Value Ref Range Status  06/27/2023 139 134 - 144 mmol/L Final         Passed - Last BP in normal range    BP Readings from Last 1 Encounters:  06/27/23 127/84         Passed - Valid encounter within last 6 months    Recent Outpatient Visits           1 month ago Routine general medical examination at a health care facility   [facility] [place], DO   3 months ago Type 2 diabetes mellitus with other neurologic complication, without long-term current use of insulin (HCC)   [place] [facility] [place], [place] P, DO   4 months ago Type 2 diabetes mellitus with other neurologic complication, without long-term current use of insulin [facility])   [place] [facility] [place], [place], DO

## 2023-08-28 ENCOUNTER — Encounter: Payer: Self-pay | Admitting: Pediatrics

## 2023-08-28 ENCOUNTER — Ambulatory Visit: Admitting: Pediatrics

## 2023-08-28 VITALS — BP 128/78 | HR 79 | Temp 98.4°F | Resp 15 | Ht 64.49 in | Wt 160.8 lb

## 2023-08-28 DIAGNOSIS — R42 Dizziness and giddiness: Secondary | ICD-10-CM

## 2023-08-28 DIAGNOSIS — I10 Essential (primary) hypertension: Secondary | ICD-10-CM

## 2023-08-28 DIAGNOSIS — E1149 Type 2 diabetes mellitus with other diabetic neurological complication: Secondary | ICD-10-CM

## 2023-08-28 MED ORDER — EMPAGLIFLOZIN 10 MG PO TABS: 10.0000 mg | ORAL_TABLET | Freq: Every day | ORAL | 1 refills | Status: DC

## 2023-08-28 NOTE — Progress Notes (Signed)
 Office Visit  BP 128/78 (BP Location: Left Arm, Patient Position: Sitting, Cuff Size: Normal)   Pulse 79   Temp 98.4 F (36.9 C) (Oral)   Resp 15   Ht 5' 4.49 (1.638 m)   Wt 160 lb 12.8 oz (72.9 kg)   SpO2 98%   BMI 27.19 kg/m    Subjective:    Patient ID: Lori Horne, female    DOB: 1965/05/05, 58 y.o.   MRN: [id number]  HPI: Lori Horne is a 58 y.o. female  Chief Complaint  Patient presents with   Dizziness    For the last week. Comes with nausea. Had to leave work yesterday.     Discussed the use of AI scribe software for clinical note transcription with the patient, who gave verbal consent to proceed.  History of Present Illness   Lori Horne is a 58 year old female who presents with dizziness and nausea.  She has been experiencing dizziness and nausea that began suddenly last week. The dizziness is described as a sensation of the room spinning or akin to car sickness, and it is accompanied by nausea during each episode. The symptoms recurred yesterday while she was sitting at work, and they do not seem to be related to changes in position.  No recent changes in medications, foods, travel, or activities such as visiting amusement parks. No changes in hearing, heart palpitations, shortness of breath, or headaches. Blood sugar and blood pressure have been normal, with home blood pressure readings around 110/80 and 115/80. No episodes of syncope.  She has a history of sinus infections but denies any current sinus pressure. She has been losing weight recently and is trying to reduce medication use. Current medications include Jardiance , which the patient reports makes her urinate more, and Metformin , which she has been taking for years. No recent changes in her blood pressure medication.      Relevant past medical, surgical, family and social history reviewed and updated as indicated. Interim medical history since our last visit reviewed. Allergies and medications  reviewed and updated.  ROS per HPI unless specifically indicated above     Objective:    BP 128/78 (BP Location: Left Arm, Patient Position: Sitting, Cuff Size: Normal)   Pulse 79   Temp 98.4 F (36.9 C) (Oral)   Resp 15   Ht 5' 4.49 (1.638 m)   Wt 160 lb 12.8 oz (72.9 kg)   SpO2 98%   BMI 27.19 kg/m   Wt Readings from Last 3 Encounters:  08/28/23 160 lb 12.8 oz (72.9 kg)  06/27/23 158 lb 12.8 oz (72 kg)  05/06/23 165 lb (74.8 kg)     Physical Exam Constitutional:      Appearance: Normal appearance.  Pulmonary:     Effort: Pulmonary effort is normal.  Musculoskeletal:        General: Normal range of motion.  Skin:    Comments: Normal skin color  Neurological:     General: No focal deficit present.     Mental Status: She is alert. Mental status is at baseline.     Cranial Nerves: No cranial nerve deficit.     Sensory: No sensory deficit.     Motor: No weakness.     Coordination: Coordination normal.     Comments: Negative dix hall pike maneuver   Psychiatric:        Mood and Affect: Mood normal.        Behavior: Behavior normal.  Thought Content: Thought content normal.         08/28/2023    3:33 PM 06/27/2023    8:03 AM 05/06/2023    8:35 AM 03/25/2023    8:23 AM 12/24/2022    8:18 AM  Depression screen PHQ 2/9  Decreased Interest 0 0 0 0 0  Down, Depressed, Hopeless 0 0 0 0 0  PHQ - 2 Score 0 0 0 0 0  Altered sleeping 0 0 0 0 1  Tired, decreased energy 0 0 0 1 1  Change in appetite 0 0 0 0 0  Feeling bad or failure about yourself  0 0 0 0 0  Trouble concentrating 0 0 0 0 0  Moving slowly or fidgety/restless 0 0 0 0 0  Suicidal thoughts 0 0 0 0 0  PHQ-9 Score 0 0 0 1 2  Difficult doing work/chores  Not difficult at all  Not difficult at all Not difficult at all       08/28/2023    3:33 PM 06/27/2023    8:12 AM 05/06/2023    8:36 AM 03/25/2023    8:24 AM  GAD 7 : Generalized Anxiety Score  Nervous, Anxious, on Edge 0 0 0 0  Control/stop  worrying 0 0 0 0  Worry too much - different things 0 0 0 0  Trouble relaxing 0 0 0 0  Restless 0 0 0 0  Easily annoyed or irritable 0 0 0 0  Afraid - awful might happen 0 0 0 0  Total GAD 7 Score 0 0 0 0  Anxiety Difficulty  Not difficult at all Not difficult at all Not difficult at all       Assessment & Plan:  Assessment & Plan   Dizziness Intermittent dizziness and nausea likely due to orthostatic hypotension, possibly from dehydration or overmedication with Jardiance . Borderline positive orthostatic vitals suggest blood pressure drop with positional changes. Differential includes dehydration, medication effects, and potential arrhythmia. - Decrease Jardiance  to 10 mg daily. - Monitor symptoms and report if dizziness recurs. - Consider Holter monitor if symptoms persist. - Order blood work to check electrolytes and anemia. - Encourage hydration and positional maneuvers. -     CBC with Differential/Platelet -     Basic metabolic panel with GFR -     Magnesium  -     D-dimer, quantitative  Type 2 diabetes mellitus with other neurologic complication, without long-term current use of insulin (HCC) Diabetes well-controlled with significant weight loss. Jardiance  may contribute to orthostatic hypotension. Improved A1c levels indicate effective management. Discussed potential need to adjust medications as weight loss continues. - Decrease Jardiance  to 10 mg daily. - Continue monitoring A1c levels. - Follow up in September to reassess diabetes management. -     Empagliflozin ; Take 1 tablet (10 mg total) by mouth daily before breakfast.  Dispense: 90 tablet; Refill: 1  Primary hypertension Blood pressure well-controlled. Recent orthostatic hypotension may suggest overmedication due to weight loss. Discussed importance of monitoring blood pressure to avoid overmedication. - Continue current antihypertensive regimen. - Monitor blood pressure regularly. - Reassess blood pressure  management at follow-up.   Follow up plan: No follow-ups on file.  Hadassah SHAUNNA Nett, MD

## 2023-08-28 NOTE — Patient Instructions (Addendum)
 Decrease jardiance  to 10mg  Keep everything else the same If symptoms return, let me know

## 2023-08-29 ENCOUNTER — Ambulatory Visit: Payer: Self-pay | Admitting: Pediatrics

## 2023-08-29 LAB — CBC WITH DIFFERENTIAL/PLATELET
Basophils Absolute: 0.1 x10E3/uL (ref 0.0–0.2)
Basos: 1 %
EOS (ABSOLUTE): 0.5 x10E3/uL — ABNORMAL HIGH (ref 0.0–0.4)
Eos: 5 %
Hematocrit: 45.1 % (ref 34.0–46.6)
Hemoglobin: 14.7 g/dL (ref 11.1–15.9)
Immature Grans (Abs): 0 x10E3/uL (ref 0.0–0.1)
Immature Granulocytes: 0 %
Lymphocytes Absolute: 2.8 x10E3/uL (ref 0.7–3.1)
Lymphs: 31 %
MCH: 29.8 pg (ref 26.6–33.0)
MCHC: 32.6 g/dL (ref 31.5–35.7)
MCV: 91 fL (ref 79–97)
Monocytes Absolute: 0.6 x10E3/uL (ref 0.1–0.9)
Monocytes: 7 %
Neutrophils Absolute: 4.9 x10E3/uL (ref 1.4–7.0)
Neutrophils: 56 %
Platelets: 344 x10E3/uL (ref 150–450)
RBC: 4.94 x10E6/uL (ref 3.77–5.28)
RDW: 12.8 % (ref 11.7–15.4)
WBC: 8.9 x10E3/uL (ref 3.4–10.8)

## 2023-08-29 LAB — BASIC METABOLIC PANEL WITH GFR
BUN/Creatinine Ratio: 27 (ref 12–28)
BUN: 28 mg/dL — ABNORMAL HIGH (ref 8–27)
CO2: 22 mmol/L (ref 20–29)
Calcium: 9.8 mg/dL (ref 8.7–10.3)
Chloride: 97 mmol/L (ref 96–106)
Creatinine, Ser: 1.03 mg/dL — ABNORMAL HIGH (ref 0.57–1.00)
Glucose: 121 mg/dL — ABNORMAL HIGH (ref 70–99)
Potassium: 4 mmol/L (ref 3.5–5.2)
Sodium: 136 mmol/L (ref 134–144)
eGFR: 62 mL/min/1.73 (ref >59)

## 2023-08-29 LAB — MAGNESIUM: Magnesium: 1.9 mg/dL (ref 1.6–2.3)

## 2023-08-29 LAB — D-DIMER, QUANTITATIVE: D-DIMER: 0.33 mg{FEU}/L (ref 0.00–0.49)

## 2023-09-02 ENCOUNTER — Ambulatory Visit
Admission: RE | Admit: 2023-09-02 | Discharge: 2023-09-02 | Disposition: A | Discharge status: Home / Self Care | Source: Ambulatory Visit | Attending: Family Medicine | Admitting: Family Medicine

## 2023-09-02 DIAGNOSIS — Z1231 Encounter for screening mammogram for malignant neoplasm of breast: Secondary | ICD-10-CM | POA: Diagnosis present

## 2023-10-01 ENCOUNTER — Ambulatory Visit: Admitting: Family Medicine

## 2023-10-07 ENCOUNTER — Encounter: Payer: Self-pay | Admitting: Family Medicine

## 2023-10-07 ENCOUNTER — Ambulatory Visit: Admitting: Family Medicine

## 2023-10-07 VITALS — BP 129/78 | HR 70 | Ht 65.0 in | Wt 159.0 lb

## 2023-10-07 DIAGNOSIS — E785 Hyperlipidemia, unspecified: Secondary | ICD-10-CM | POA: Diagnosis not present

## 2023-10-07 DIAGNOSIS — E1169 Type 2 diabetes mellitus with other specified complication: Secondary | ICD-10-CM

## 2023-10-07 DIAGNOSIS — Z7984 Long term (current) use of oral hypoglycemic drugs: Secondary | ICD-10-CM

## 2023-10-07 DIAGNOSIS — E1149 Type 2 diabetes mellitus with other diabetic neurological complication: Secondary | ICD-10-CM | POA: Diagnosis not present

## 2023-10-07 LAB — BAYER DCA HB A1C WAIVED: HB A1C (BAYER DCA - WAIVED): 7 % — ABNORMAL HIGH (ref 4.8–5.6)

## 2023-10-07 MED ORDER — GLUCOSE BLOOD VI STRP: ORAL_STRIP | 12 refills | Status: AC

## 2023-10-07 NOTE — Progress Notes (Signed)
 BP 129/78 (BP Location: Left Arm, Patient Position: Sitting, Cuff Size: Normal)   Pulse 70   Ht 5' 5 (1.651 m)   Wt 159 lb (72.1 kg)   BMI 26.46 kg/m    Subjective:    Patient ID: Lori Horne, female    DOB: 30-Jun-1965, 58 y.o.   MRN: [id number]  HPI: Lori Horne is a 58 y.o. female  Chief Complaint  Patient presents with   Diabetes   DIABETES Hypoglycemic episodes:no Polydipsia/polyuria: no Visual disturbance: no Chest pain: no Paresthesias: no Glucose Monitoring: yes  Accucheck frequency: occasionally Taking Insulin?: no Blood Pressure Monitoring: not checking Retinal Examination: Up to Date Foot Exam: Up to Date Diabetic Education: Completed Pneumovax: Not up to Date Influenza: Not up to Date Aspirin : yes  Relevant past medical, surgical, family and social history reviewed and updated as indicated. Interim medical history since our last visit reviewed. Allergies and medications reviewed and updated.  Review of Systems  Constitutional: Negative.   Respiratory: Negative.    Cardiovascular: Negative.   Musculoskeletal: Negative.   Psychiatric/Behavioral: Negative.      Per HPI unless specifically indicated above     Objective:    BP 129/78 (BP Location: Left Arm, Patient Position: Sitting, Cuff Size: Normal)   Pulse 70   Ht 5' 5 (1.651 m)   Wt 159 lb (72.1 kg)   BMI 26.46 kg/m   Wt Readings from Last 3 Encounters:  10/07/23 159 lb (72.1 kg)  08/28/23 160 lb 12.8 oz (72.9 kg)  06/27/23 158 lb 12.8 oz (72 kg)    Physical Exam Vitals and nursing note reviewed.  Constitutional:      General: She is not in acute distress.    Appearance: Normal appearance. She is not ill-appearing, toxic-appearing or diaphoretic.  HENT:     Head: Normocephalic and atraumatic.     Right Ear: External ear normal.     Left Ear: External ear normal.     Nose: Nose normal.     Mouth/Throat:     Mouth: Mucous membranes are moist.     Pharynx: Oropharynx is clear.   Eyes:     General: No scleral icterus.       Right eye: No discharge.        Left eye: No discharge.     Extraocular Movements: Extraocular movements intact.     Conjunctiva/sclera: Conjunctivae normal.     Pupils: Pupils are equal, round, and reactive to light.  Cardiovascular:     Rate and Rhythm: Normal rate and regular rhythm.     Pulses: Normal pulses.     Heart sounds: Normal heart sounds. No murmur heard.    No friction rub. No gallop.  Pulmonary:     Effort: Pulmonary effort is normal. No respiratory distress.     Breath sounds: Normal breath sounds. No stridor. No wheezing, rhonchi or rales.  Chest:     Chest wall: No tenderness.  Musculoskeletal:        General: Normal range of motion.     Cervical back: Normal range of motion and neck supple.  Skin:    General: Skin is warm and dry.     Capillary Refill: Capillary refill takes less than 2 seconds.     Coloration: Skin is not jaundiced or pale.     Findings: No bruising, erythema, lesion or rash.  Neurological:     General: No focal deficit present.     Mental Status: She is alert and oriented  to person, place, and time. Mental status is at baseline.  Psychiatric:        Mood and Affect: Mood normal.        Behavior: Behavior normal.        Thought Content: Thought content normal.        Judgment: Judgment normal.     Results for orders placed or performed in visit on 08/28/23  CBC w/Diff   Collection Time: 08/28/23  4:14 PM  Result Value Ref Range   WBC 8.9 3.4 - 10.8 x10E3/uL   RBC 4.94 3.77 - 5.28 x10E6/uL   Hemoglobin 14.7 11.1 - 15.9 g/dL   Hematocrit 54.8 65.9 - 46.6 %   MCV 91 79 - 97 fL   MCH 29.8 26.6 - 33.0 pg   MCHC 32.6 31.5 - 35.7 g/dL   RDW 87.1 88.2 - 84.5 %   Platelets 344 150 - 450 x10E3/uL   Neutrophils 56 Not Estab. %   Lymphs 31 Not Estab. %   Monocytes 7 Not Estab. %   Eos 5 Not Estab. %   Basos 1 Not Estab. %   Neutrophils Absolute 4.9 1.4 - 7.0 x10E3/uL   Lymphocytes Absolute  2.8 0.7 - 3.1 x10E3/uL   Monocytes Absolute 0.6 0.1 - 0.9 x10E3/uL   EOS (ABSOLUTE) 0.5 (H) 0.0 - 0.4 x10E3/uL   Basophils Absolute 0.1 0.0 - 0.2 x10E3/uL   Immature Granulocytes 0 Not Estab. %   Immature Grans (Abs) 0.0 0.0 - 0.1 x10E3/uL  Basic Metabolic Panel (BMET)   Collection Time: 08/28/23  4:14 PM  Result Value Ref Range   Glucose 121 (H) 70 - 99 mg/dL   BUN 28 (H) 8 - 27 mg/dL   Creatinine, Ser 8.96 (H) 0.57 - 1.00 mg/dL   eGFR 62 >40 fO/fpw/8.26   BUN/Creatinine Ratio 27 12 - 28   Sodium 136 134 - 144 mmol/L   Potassium 4.0 3.5 - 5.2 mmol/L   Chloride 97 96 - 106 mmol/L   CO2 22 20 - 29 mmol/L   Calcium 9.8 8.7 - 10.3 mg/dL  Magnesium    Collection Time: 08/28/23  4:14 PM  Result Value Ref Range   Magnesium  1.9 1.6 - 2.3 mg/dL  D-Dimer, Quantitative   Collection Time: 08/28/23  4:14 PM  Result Value Ref Range   D-DIMER 0.33 0.00 - 0.49 mg/L FEU      Assessment & Plan:   Problem List Items Addressed This Visit       Endocrine   Type 2 diabetes mellitus (HCC) - Primary   Doing great with A1c of 7.0. Continue current regimen. Continue to monitor. Call with any concerns.       Relevant Medications   glucose blood test strip   Other Relevant Orders   Bayer DCA Hb A1c Waived   Hyperlipidemia associated with type 2 diabetes mellitus (HCC)   Relevant Medications   glucose blood test strip     Follow up plan: Return in about 3 months (around 01/06/2024).

## 2023-10-07 NOTE — Assessment & Plan Note (Signed)
Doing great with A1c of 7.0. Continue current regimen. Continue to monitor. Call with any concerns.  

## 2023-10-14 ENCOUNTER — Other Ambulatory Visit: Payer: Self-pay

## 2023-10-14 MED ORDER — MICROLET LANCETS MISC: 1.0000 | Freq: Every day | 12 refills | Status: AC | PRN

## 2023-10-14 MED ORDER — CONTOUR PLUS BLUE W/DEVICE KIT: 1.0000 | PACK | Freq: Every day | 0 refills | Status: AC | PRN

## 2023-11-13 ENCOUNTER — Other Ambulatory Visit: Payer: Self-pay | Admitting: Family Medicine

## 2023-11-14 NOTE — Telephone Encounter (Signed)
 Requested Prescriptions  Pending Prescriptions Disp Refills   losartan  (COZAAR ) 25 MG tablet [Pharmacy Med Name: Losartan  Potassium 25 MG Oral Tablet] 90 tablet 3    Sig: TAKE 1 TABLET BY MOUTH DAILY     Cardiovascular:  Angiotensin Receptor Blockers Failed - 11/14/2023  1:36 PM      Failed - Cr in normal range and within 180 days    Creatinine, Ser  Date Value Ref Range Status  08/28/2023 1.03 (H) 0.57 - 1.00 mg/dL Final         Passed - K in normal range and within 180 days    Potassium  Date Value Ref Range Status  08/28/2023 4.0 3.5 - 5.2 mmol/L Final         Passed - Patient is not pregnant      Passed - Last BP in normal range    BP Readings from Last 1 Encounters:  10/07/23 129/78         Passed - Valid encounter within last 6 months    Recent Outpatient Visits           1 month ago Type 2 diabetes mellitus with other neurologic complication, without long-term current use of insulin (HCC)   [place] [facility] [place], [place], DO   2 months ago Dizziness   [place] [facility] Herold Hadassah SQUIBB, MD   4 months ago Routine general medical examination at a health care facility   [facility] [place] P, DO   6 months ago Type 2 diabetes mellitus with other neurologic complication, without long-term current use of insulin (HCC)   [place] [facility] [place], Megan P, DO   7 months ago Type 2 diabetes mellitus with other neurologic complication, without long-term current use of insulin [facility])   [place] [facility] [place], [place], DO

## 2023-11-20 ENCOUNTER — Other Ambulatory Visit: Payer: Self-pay | Admitting: Family Medicine

## 2023-11-21 NOTE — Telephone Encounter (Signed)
 Requested Prescriptions  Pending Prescriptions Disp Refills   DULoxetine  (CYMBALTA ) 20 MG capsule [Pharmacy Med Name: DULoxetine  HCl 20 MG Oral Capsule Delayed Release Particles] 90 capsule 0    Sig: TAKE 1 CAPSULE BY MOUTH DAILY     Psychiatry: Antidepressants - SNRI - duloxetine  Failed - 11/21/2023  4:17 PM      Failed - Cr in normal range and within 360 days    Creatinine, Ser  Date Value Ref Range Status  08/28/2023 1.03 (H) 0.57 - 1.00 mg/dL Final         Passed - eGFR is 30 or above and within 360 days    GFR calc Af Amer  Date Value Ref Range Status  10/20/2019 113 >59 mL/min/1.73 Final    Comment:    **In accordance with recommendations from the NKF-ASN Task force,**   [facility] is in the process of updating its eGFR calculation to the   2021 CKD-EPI creatinine equation that estimates kidney function   without a race variable.    GFR calc non Af Amer  Date Value Ref Range Status  10/20/2019 98 >59 mL/min/1.73 Final   eGFR  Date Value Ref Range Status  08/28/2023 62 >59 mL/min/1.73 Final         Passed - Completed PHQ-2 or PHQ-9 in the last 360 days      Passed - Last BP in normal range    BP Readings from Last 1 Encounters:  10/07/23 129/78         Passed - Valid encounter within last 6 months    Recent Outpatient Visits           1 month ago Type 2 diabetes mellitus with other neurologic complication, without long-term current use of insulin (HCC)   [facility] [place], [place], DO   2 months ago Dizziness   [place] [facility] Herold Hadassah SQUIBB, MD   4 months ago Routine general medical examination at a health care facility   [facility] [place] P, DO   6 months ago Type 2 diabetes mellitus with other neurologic complication, without long-term current use of insulin (HCC)   [place] [facility] [place], Megan P, DO   8 months ago Type 2 diabetes mellitus with other  neurologic complication, without long-term current use of insulin [facility])   [place] [facility] [place], [place], DO

## 2023-11-30 ENCOUNTER — Other Ambulatory Visit: Payer: Self-pay | Admitting: Pediatrics

## 2023-11-30 DIAGNOSIS — E1149 Type 2 diabetes mellitus with other diabetic neurological complication: Secondary | ICD-10-CM

## 2023-12-01 NOTE — Telephone Encounter (Signed)
 Too soon for refill.  Requested Prescriptions  Pending Prescriptions Disp Refills   JARDIANCE  10 MG TABS tablet [Pharmacy Med Name: Jardiance  10 MG Oral Tablet] 90 tablet 3    Sig: TAKE 1 TABLET BY MOUTH ONCE  DAILY BEFORE BREAKFAST     Endocrinology:  Diabetes - SGLT2 Inhibitors Failed - 12/01/2023  4:03 PM      Failed - Cr in normal range and within 360 days    Creatinine, Ser  Date Value Ref Range Status  08/28/2023 1.03 (H) 0.57 - 1.00 mg/dL Final         Passed - HBA1C is between 0 and 7.9 and within 180 days    Hemoglobin A1C  Date Value Ref Range Status  11/15/2015 7.4  Final   HB A1C (BAYER DCA - WAIVED)  Date Value Ref Range Status  10/07/2023 7.0 (H) 4.8 - 5.6 % Final    Comment:             Prediabetes: 5.7 - 6.4          Diabetes: >6.4          Glycemic control for adults with diabetes: <7.0          Passed - eGFR in normal range and within 360 days    GFR calc Af Amer  Date Value Ref Range Status  10/20/2019 113 >59 mL/min/1.73 Final    Comment:    **In accordance with recommendations from the NKF-ASN Task force,**   [facility] is in the process of updating its eGFR calculation to the   2021 CKD-EPI creatinine equation that estimates kidney function   without a race variable.    GFR calc non Af Amer  Date Value Ref Range Status  10/20/2019 98 >59 mL/min/1.73 Final   eGFR  Date Value Ref Range Status  08/28/2023 62 >59 mL/min/1.73 Final         Passed - Valid encounter within last 6 months    Recent Outpatient Visits           1 month ago Type 2 diabetes mellitus with other neurologic complication, without long-term current use of insulin (HCC)   [place] [facility], [place], DO   3 months ago Dizziness   [place] [facility] Herold Hadassah SQUIBB, MD   5 months ago Routine general medical examination at a health care facility   [facility], [place], DO   6 months ago Type 2  diabetes mellitus with other neurologic complication, without long-term current use of insulin (HCC)   [place] [facility], Megan P, DO   8 months ago Type 2 diabetes mellitus with other neurologic complication, without long-term current use of insulin [facility])    [facility] [place], [place], DO

## 2023-12-05 ENCOUNTER — Other Ambulatory Visit: Payer: Self-pay | Admitting: Family Medicine

## 2023-12-09 NOTE — Telephone Encounter (Signed)
 Requested Prescriptions  Pending Prescriptions Disp Refills   RYBELSUS  14 MG TABS [Pharmacy Med Name: Rybelsus  14 MG Oral Tablet] 90 tablet 3    Sig: TAKE 1 TABLET BY MOUTH DAILY     Off-Protocol Failed - 12/09/2023  1:37 PM      Failed - Medication not assigned to a protocol, review manually.      Passed - Valid encounter within last 12 months    Recent Outpatient Visits           2 months ago Type 2 diabetes mellitus with other neurologic complication, without long-term current use of insulin (HCC)   [place] [facility] [place], Megan P, DO   3 months ago Dizziness   [place] [facility] Herold Hadassah SQUIBB, MD   5 months ago Routine general medical examination at a health care facility   [facility] [place], DO   7 months ago Type 2 diabetes mellitus with other neurologic complication, without long-term current use of insulin (HCC)   [place] [facility], [facility] P, DO   8 months ago Type 2 diabetes mellitus with other neurologic complication, without long-term current use of insulin (HCC)   [place] [facility] [place], [place] P, DO               metFORMIN  (GLUCOPHAGE ) 500 MG tablet [Pharmacy Med Name: metFORMIN  HCl 500 MG Oral Tablet] 360 tablet 1    Sig: TAKE 2 TABLETS BY MOUTH TWICE  DAILY WITH MEALS     Endocrinology:  Diabetes - Biguanides Failed - 12/09/2023  1:37 PM      Failed - Cr in normal range and within 360 days    Creatinine, Ser  Date Value Ref Range Status  08/28/2023 1.03 (H) 0.57 - 1.00 mg/dL Final         Failed - B12 Level in normal range and within 720 days    No results found for: VITAMINB12       Passed - HBA1C is between 0 and 7.9 and within 180 days    Hemoglobin A1C  Date Value Ref Range Status  11/15/2015 7.4  Final   HB A1C (BAYER DCA - WAIVED)  Date Value Ref Range Status  10/07/2023 7.0 (H) 4.8 - 5.6 % Final    Comment:              Prediabetes: 5.7 - 6.4          Diabetes: >6.4          Glycemic control for adults with diabetes: <7.0          Passed - eGFR in normal range and within 360 days    GFR calc Af Amer  Date Value Ref Range Status  10/20/2019 113 >59 mL/min/1.73 Final    Comment:    **In accordance with recommendations from the NKF-ASN Task force,**   [facility] is in the process of updating its eGFR calculation to the   2021 CKD-EPI creatinine equation that estimates kidney function   without a race variable.    GFR calc non Af Amer  Date Value Ref Range Status  10/20/2019 98 >59 mL/min/1.73 Final   eGFR  Date Value Ref Range Status  08/28/2023 62 >59 mL/min/1.73 Final         Passed - Valid encounter within last 6 months    Recent Outpatient Visits  2 months ago Type 2 diabetes mellitus with other neurologic complication, without long-term current use of insulin (HCC)   [place] [facility] [place], Megan P, DO   3 months ago Dizziness   [place] [facility] Herold Hadassah SQUIBB, MD   5 months ago Routine general medical examination at a health care facility   [facility] [place], DO   7 months ago Type 2 diabetes mellitus with other neurologic complication, without long-term current use of insulin (HCC)   [place] [facility], Megan P, DO   8 months ago Type 2 diabetes mellitus with other neurologic complication, without long-term current use of insulin (HCC)   [place] [facility] [place], [place] P, DO              Passed - CBC within normal limits and completed in the last 12 months    WBC  Date Value Ref Range Status  08/28/2023 8.9 3.4 - 10.8 x10E3/uL Final   RBC  Date Value Ref Range Status  08/28/2023 4.94 3.77 - 5.28 x10E6/uL Final   Hemoglobin  Date Value Ref Range Status  08/28/2023 14.7 11.1 - 15.9 g/dL Final   Hematocrit  Date Value Ref Range Status   08/28/2023 45.1 34.0 - 46.6 % Final   MCHC  Date Value Ref Range Status  08/28/2023 32.6 31.5 - 35.7 g/dL Final   [facility]  Date Value Ref Range Status  08/28/2023 29.8 26.6 - 33.0 pg Final   MCV  Date Value Ref Range Status  08/28/2023 91 79 - 97 fL Final   No results found for: PLTCOUNTKUC, LABPLAT, POCPLA RDW  Date Value Ref Range Status  08/28/2023 12.8 11.7 - 15.4 % Final          aspirin  EC (ASPIRIN  LOW DOSE) 81 MG tablet [Pharmacy Med Name: ACETYSALICYLIC ACID  81MG   TAB  ENTERIC COATED] 90 tablet 3    Sig: TAKE 1 TABLET BY MOUTH DAILY     Analgesics:  NSAIDS - aspirin  Failed - 12/09/2023  1:37 PM      Failed - Cr in normal range and within 360 days    Creatinine, Ser  Date Value Ref Range Status  08/28/2023 1.03 (H) 0.57 - 1.00 mg/dL Final         Passed - eGFR is 10 or above and within 360 days    GFR calc Af Amer  Date Value Ref Range Status  10/20/2019 113 >59 mL/min/1.73 Final    Comment:    **In accordance with recommendations from the NKF-ASN Task force,**   [facility] is in the process of updating its eGFR calculation to the   2021 CKD-EPI creatinine equation that estimates kidney function   without a race variable.    GFR calc non Af Amer  Date Value Ref Range Status  10/20/2019 98 >59 mL/min/1.73 Final   eGFR  Date Value Ref Range Status  08/28/2023 62 >59 mL/min/1.73 Final         Passed - Patient is not pregnant      Passed - Valid encounter within last 12 months    Recent Outpatient Visits           2 months ago Type 2 diabetes mellitus with other neurologic complication, without long-term current use of insulin (HCC)   [place] [facility] [place], Megan P, DO   3 months ago Dizziness   [place] [facility]  Herold Hadassah SQUIBB, MD   5 months ago Routine general medical examination at a health care facility   [facility] [place], DO   7 months ago Type 2 diabetes mellitus  with other neurologic complication, without long-term current use of insulin (HCC)   [place] [facility], [facility], DO   8 months ago Type 2 diabetes mellitus with other neurologic complication, without long-term current use of insulin (HCC)   [place] [facility] [place], Megan P, DO              Refused Prescriptions Disp Refills   losartan  (COZAAR ) 25 MG tablet [Pharmacy Med Name: Losartan  Potassium 25 MG Oral Tablet] 90 tablet 3    Sig: TAKE 1 TABLET BY MOUTH DAILY     Cardiovascular:  Angiotensin Receptor Blockers Failed - 12/09/2023  1:37 PM      Failed - Cr in normal range and within 180 days    Creatinine, Ser  Date Value Ref Range Status  08/28/2023 1.03 (H) 0.57 - 1.00 mg/dL Final         Passed - K in normal range and within 180 days    Potassium  Date Value Ref Range Status  08/28/2023 4.0 3.5 - 5.2 mmol/L Final         Passed - Patient is not pregnant      Passed - Last BP in normal range    BP Readings from Last 1 Encounters:  10/07/23 129/78         Passed - Valid encounter within last 6 months    Recent Outpatient Visits           2 months ago Type 2 diabetes mellitus with other neurologic complication, without long-term current use of insulin (HCC)   [place] [facility] [place], [place], DO   3 months ago Dizziness   [place] [facility] Herold Hadassah SQUIBB, MD   5 months ago Routine general medical examination at a health care facility   [facility], [place], DO   7 months ago Type 2 diabetes mellitus with other neurologic complication, without long-term current use of insulin (HCC)   [place] [facility] [place], Megan P, DO   8 months ago Type 2 diabetes mellitus with other neurologic complication, without long-term current use of insulin [facility])   [place] [facility] [place], [place], DO

## 2023-12-09 NOTE — Telephone Encounter (Signed)
 Requested medications are due for refill today.  yes  Requested medications are on the active medications list.  yes  Last refill. 06/27/2023 #90 1 rf  Future visit scheduled.   yes  Notes to clinic.  Medication not assigned to a protocol. Please review for refill.    Requested Prescriptions  Pending Prescriptions Disp Refills   RYBELSUS  14 MG TABS [Pharmacy Med Name: Rybelsus  14 MG Oral Tablet] 90 tablet 3    Sig: TAKE 1 TABLET BY MOUTH DAILY     Off-Protocol Failed - 12/09/2023  1:38 PM      Failed - Medication not assigned to a protocol, review manually.      Passed - Valid encounter within last 12 months    Recent Outpatient Visits           2 months ago Type 2 diabetes mellitus with other neurologic complication, without long-term current use of insulin (HCC)   [place] [facility] [place], Megan P, DO   3 months ago Dizziness   [place] [facility] Herold Hadassah SQUIBB, MD   5 months ago Routine general medical examination at a health care facility   [place] [facility], [place] P, DO   7 months ago Type 2 diabetes mellitus with other neurologic complication, without long-term current use of insulin (HCC)   [place] [facility], Megan P, DO   8 months ago Type 2 diabetes mellitus with other neurologic complication, without long-term current use of insulin (HCC)   [place] [facility], Megan P, DO              Signed Prescriptions Disp Refills   metFORMIN  (GLUCOPHAGE ) 500 MG tablet 360 tablet 1    Sig: TAKE 2 TABLETS BY MOUTH TWICE  DAILY WITH MEALS     Endocrinology:  Diabetes - Biguanides Failed - 12/09/2023  1:38 PM      Failed - Cr in normal range and within 360 days    Creatinine, Ser  Date Value Ref Range Status  08/28/2023 1.03 (H) 0.57 - 1.00 mg/dL Final         Failed - B12 Level in normal range and within 720 days    No results found for: VITAMINB12        Passed - HBA1C is between 0 and 7.9 and within 180 days    Hemoglobin A1C  Date Value Ref Range Status  11/15/2015 7.4  Final   HB A1C (BAYER DCA - WAIVED)  Date Value Ref Range Status  10/07/2023 7.0 (H) 4.8 - 5.6 % Final    Comment:             Prediabetes: 5.7 - 6.4          Diabetes: >6.4          Glycemic control for adults with diabetes: <7.0          Passed - eGFR in normal range and within 360 days    GFR calc Af Amer  Date Value Ref Range Status  10/20/2019 113 >59 mL/min/1.73 Final    Comment:    **In accordance with recommendations from the NKF-ASN Task force,**   [facility] is in the process of updating its eGFR calculation to the   2021 CKD-EPI creatinine equation that estimates kidney function   without a race variable.    GFR calc non Af Amer  Date Value Ref Range Status  10/20/2019 98 >59 mL/min/1.73  Final   eGFR  Date Value Ref Range Status  08/28/2023 62 >59 mL/min/1.73 Final         Passed - Valid encounter within last 6 months    Recent Outpatient Visits           2 months ago Type 2 diabetes mellitus with other neurologic complication, without long-term current use of insulin (HCC)   [place] [facility] [place], Megan P, DO   3 months ago Dizziness   [place] Herold Hadassah SQUIBB, MD   5 months ago Routine general medical examination at a health care facility   [facility], [place] P, DO   7 months ago Type 2 diabetes mellitus with other neurologic complication, without long-term current use of insulin (HCC)   [place] [facility] [place], Megan P, DO   8 months ago Type 2 diabetes mellitus with other neurologic complication, without long-term current use of insulin (HCC)   [place] [facility] [place], Megan P, DO              Passed - CBC within normal limits and completed in the last 12 months    WBC  Date Value Ref Range Status   08/28/2023 8.9 3.4 - 10.8 x10E3/uL Final   RBC  Date Value Ref Range Status  08/28/2023 4.94 3.77 - 5.28 x10E6/uL Final   Hemoglobin  Date Value Ref Range Status  08/28/2023 14.7 11.1 - 15.9 g/dL Final   Hematocrit  Date Value Ref Range Status  08/28/2023 45.1 34.0 - 46.6 % Final   MCHC  Date Value Ref Range Status  08/28/2023 32.6 31.5 - 35.7 g/dL Final   [facility]  Date Value Ref Range Status  08/28/2023 29.8 26.6 - 33.0 pg Final   MCV  Date Value Ref Range Status  08/28/2023 91 79 - 97 fL Final   No results found for: PLTCOUNTKUC, LABPLAT, POCPLA RDW  Date Value Ref Range Status  08/28/2023 12.8 11.7 - 15.4 % Final          aspirin  EC (ASPIRIN  LOW DOSE) 81 MG tablet 90 tablet 3    Sig: TAKE 1 TABLET BY MOUTH DAILY     Analgesics:  NSAIDS - aspirin  Failed - 12/09/2023  1:38 PM      Failed - Cr in normal range and within 360 days    Creatinine, Ser  Date Value Ref Range Status  08/28/2023 1.03 (H) 0.57 - 1.00 mg/dL Final         Passed - eGFR is 10 or above and within 360 days    GFR calc Af Amer  Date Value Ref Range Status  10/20/2019 113 >59 mL/min/1.73 Final    Comment:    **In accordance with recommendations from the NKF-ASN Task force,**   [facility] is in the process of updating its eGFR calculation to the   2021 CKD-EPI creatinine equation that estimates kidney function   without a race variable.    GFR calc non Af Amer  Date Value Ref Range Status  10/20/2019 98 >59 mL/min/1.73 Final   eGFR  Date Value Ref Range Status  08/28/2023 62 >59 mL/min/1.73 Final         Passed - Patient is not pregnant      Passed - Valid encounter within last 12 months    Recent Outpatient Visits           2 months ago Type 2 diabetes  mellitus with other neurologic complication, without long-term current use of insulin (HCC)   [place] [facility] [place], Megan P, DO   3 months ago Dizziness   [place] [facility] Herold Hadassah SQUIBB, MD   5 months ago Routine general medical examination at a health care facility   [place] [facility], [place] P, DO   7 months ago Type 2 diabetes mellitus with other neurologic complication, without long-term current use of insulin (HCC)   [place] [facility], Megan P, DO   8 months ago Type 2 diabetes mellitus with other neurologic complication, without long-term current use of insulin (HCC)   [place] [facility] [place], Megan P, DO              Refused Prescriptions Disp Refills   losartan  (COZAAR ) 25 MG tablet [Pharmacy Med Name: Losartan  Potassium 25 MG Oral Tablet] 90 tablet 3    Sig: TAKE 1 TABLET BY MOUTH DAILY     Cardiovascular:  Angiotensin Receptor Blockers Failed - 12/09/2023  1:38 PM      Failed - Cr in normal range and within 180 days    Creatinine, Ser  Date Value Ref Range Status  08/28/2023 1.03 (H) 0.57 - 1.00 mg/dL Final         Passed - K in normal range and within 180 days    Potassium  Date Value Ref Range Status  08/28/2023 4.0 3.5 - 5.2 mmol/L Final         Passed - Patient is not pregnant      Passed - Last BP in normal range    BP Readings from Last 1 Encounters:  10/07/23 129/78         Passed - Valid encounter within last 6 months    Recent Outpatient Visits           2 months ago Type 2 diabetes mellitus with other neurologic complication, without long-term current use of insulin (HCC)   [place] [facility] [place], [place], DO   3 months ago Dizziness   [place] [facility] Herold Hadassah SQUIBB, MD   5 months ago Routine general medical examination at a health care facility   [facility], [place] P, DO   7 months ago Type 2 diabetes mellitus with other neurologic complication, without long-term current use of insulin (HCC)   [place] [facility] [place], Megan P, DO   8 months ago Type 2  diabetes mellitus with other neurologic complication, without long-term current use of insulin [facility])    [facility] [place], [place], DO

## 2023-12-19 ENCOUNTER — Encounter: Payer: Self-pay | Admitting: Family Medicine

## 2023-12-30 ENCOUNTER — Other Ambulatory Visit: Payer: Self-pay | Admitting: Pediatrics

## 2023-12-30 DIAGNOSIS — E1149 Type 2 diabetes mellitus with other diabetic neurological complication: Secondary | ICD-10-CM

## 2024-01-06 ENCOUNTER — Ambulatory Visit: Admitting: Family Medicine

## 2024-01-15 ENCOUNTER — Other Ambulatory Visit: Payer: Self-pay | Admitting: Family Medicine

## 2024-01-22 ENCOUNTER — Ambulatory Visit: Admitting: Family Medicine

## 2024-01-28 ENCOUNTER — Other Ambulatory Visit: Payer: Self-pay | Admitting: Family Medicine

## 2024-01-28 NOTE — Telephone Encounter (Signed)
 Requested Prescriptions  Pending Prescriptions Disp Refills   losartan  (COZAAR ) 25 MG tablet [Pharmacy Med Name: Losartan  Potassium 25 MG Oral Tablet] 90 tablet 0    Sig: TAKE 1 TABLET BY MOUTH DAILY     Cardiovascular:  Angiotensin Receptor Blockers Failed - 01/28/2024  1:53 PM      Failed - Cr in normal range and within 180 days    Creatinine, Ser  Date Value Ref Range Status  08/28/2023 1.03 (H) 0.57 - 1.00 mg/dL Final         Passed - K in normal range and within 180 days    Potassium  Date Value Ref Range Status  08/28/2023 4.0 3.5 - 5.2 mmol/L Final         Passed - Patient is not pregnant      Passed - Last BP in normal range    BP Readings from Last 1 Encounters:  10/07/23 129/78         Passed - Valid encounter within last 6 months    Recent Outpatient Visits           3 months ago Type 2 diabetes mellitus with other neurologic complication, without long-term current use of insulin (HCC)   [place] [facility] [place], Megan P, DO   5 months ago Dizziness   [place] [facility] Herold Hadassah SQUIBB, MD   7 months ago Routine general medical examination at a health care facility   [facility], [place] P, DO   8 months ago Type 2 diabetes mellitus with other neurologic complication, without long-term current use of insulin (HCC)   [place] [facility] [place], Megan P, DO   10 months ago Type 2 diabetes mellitus with other neurologic complication, without long-term current use of insulin (HCC)   [place] [facility] [place], Megan P, DO               hydrochlorothiazide  (HYDRODIURIL ) 25 MG tablet [Pharmacy Med Name: hydroCHLOROthiazide  25 MG Oral Tablet] 90 tablet 0    Sig: TAKE 1 TABLET BY MOUTH DAILY     Cardiovascular: Diuretics - Thiazide Failed - 01/28/2024  1:53 PM      Failed - Cr in normal range and within 180 days    Creatinine, Ser  Date Value Ref Range Status   08/28/2023 1.03 (H) 0.57 - 1.00 mg/dL Final         Passed - K in normal range and within 180 days    Potassium  Date Value Ref Range Status  08/28/2023 4.0 3.5 - 5.2 mmol/L Final         Passed - Na in normal range and within 180 days    Sodium  Date Value Ref Range Status  08/28/2023 136 134 - 144 mmol/L Final         Passed - Last BP in normal range    BP Readings from Last 1 Encounters:  10/07/23 129/78         Passed - Valid encounter within last 6 months    Recent Outpatient Visits           3 months ago Type 2 diabetes mellitus with other neurologic complication, without long-term current use of insulin (HCC)   [place] [facility], [place], DO   5 months ago Dizziness   [place] [facility] Herold Hadassah SQUIBB, MD   7 months ago Routine general medical  examination at a health care facility   [facility], [place] P, DO   8 months ago Type 2 diabetes mellitus with other neurologic complication, without long-term current use of insulin (HCC)   [place] [facility] [place], Megan P, DO   10 months ago Type 2 diabetes mellitus with other neurologic complication, without long-term current use of insulin (HCC)   [place] [facility], Megan P, DO               DULoxetine  (CYMBALTA ) 20 MG capsule [Pharmacy Med Name: DULoxetine  HCl 20 MG Oral Capsule Delayed Release Particles] 90 capsule 0    Sig: TAKE 1 CAPSULE BY MOUTH DAILY     Psychiatry: Antidepressants - SNRI - duloxetine  Failed - 01/28/2024  1:53 PM      Failed - Cr in normal range and within 360 days    Creatinine, Ser  Date Value Ref Range Status  08/28/2023 1.03 (H) 0.57 - 1.00 mg/dL Final         Passed - eGFR is 30 or above and within 360 days    GFR calc Af Amer  Date Value Ref Range Status  10/20/2019 113 >59 mL/min/1.73 Final    Comment:    **In accordance with recommendations from the  NKF-ASN Task force,**   [facility] is in the process of updating its eGFR calculation to the   2021 CKD-EPI creatinine equation that estimates kidney function   without a race variable.    GFR calc non Af Amer  Date Value Ref Range Status  10/20/2019 98 >59 mL/min/1.73 Final   eGFR  Date Value Ref Range Status  08/28/2023 62 >59 mL/min/1.73 Final         Passed - Completed PHQ-2 or PHQ-9 in the last 360 days      Passed - Last BP in normal range    BP Readings from Last 1 Encounters:  10/07/23 129/78         Passed - Valid encounter within last 6 months    Recent Outpatient Visits           3 months ago Type 2 diabetes mellitus with other neurologic complication, without long-term current use of insulin (HCC)   [place] [facility] [place], [place], DO   5 months ago Dizziness   [place] [facility] Herold Hadassah SQUIBB, MD   7 months ago Routine general medical examination at a health care facility   [facility], [place] P, DO   8 months ago Type 2 diabetes mellitus with other neurologic complication, without long-term current use of insulin (HCC)   [place] [facility], Megan P, DO   10 months ago Type 2 diabetes mellitus with other neurologic complication, without long-term current use of insulin (HCC)   [place] [facility], Megan P, DO               levocetirizine (XYZAL ) 5 MG tablet [Pharmacy Med Name: Levocetirizine Dihydrochloride  5 MG Oral Tablet] 90 tablet 0    Sig: TAKE 1 TABLET BY MOUTH IN THE  EVENING     Ear, Nose, and Throat:  Antihistamines - levocetirizine dihydrochloride  Failed - 01/28/2024  1:53 PM      Failed - Cr in normal range and within 360 days    Creatinine, Ser  Date Value Ref Range Status  08/28/2023 1.03 (H) 0.57 - 1.00 mg/dL Final  Passed - eGFR is 10 or above and within 360 days    GFR calc Af Amer  Date Value Ref Range  Status  10/20/2019 113 >59 mL/min/1.73 Final    Comment:    **In accordance with recommendations from the NKF-ASN Task force,**   [facility] is in the process of updating its eGFR calculation to the   2021 CKD-EPI creatinine equation that estimates kidney function   without a race variable.    GFR calc non Af Amer  Date Value Ref Range Status  10/20/2019 98 >59 mL/min/1.73 Final   eGFR  Date Value Ref Range Status  08/28/2023 62 >59 mL/min/1.73 Final         Passed - Valid encounter within last 12 months    Recent Outpatient Visits           3 months ago Type 2 diabetes mellitus with other neurologic complication, without long-term current use of insulin (HCC)   [place] [facility] [place], [place], DO   5 months ago Dizziness   [place] [facility] Herold Hadassah SQUIBB, MD   7 months ago Routine general medical examination at a health care facility   [facility], [place] P, DO   8 months ago Type 2 diabetes mellitus with other neurologic complication, without long-term current use of insulin (HCC)   [place] [facility], Megan P, DO   10 months ago Type 2 diabetes mellitus with other neurologic complication, without long-term current use of insulin (HCC)   [place] [facility], Megan P, DO               RYBELSUS  14 MG TABS [Pharmacy Med Name: Rybelsus  14 MG Oral Tablet] 90 tablet 3    Sig: TAKE 1 TABLET BY MOUTH DAILY     Off-Protocol Failed - 01/28/2024  1:53 PM      Failed - Medication not assigned to a protocol, review manually.      Passed - Valid encounter within last 12 months    Recent Outpatient Visits           3 months ago Type 2 diabetes mellitus with other neurologic complication, without long-term current use of insulin (HCC)   [place] [facility] [place], [place], DO   5 months ago Dizziness   [place] [facility]  Herold Hadassah SQUIBB, MD   7 months ago Routine general medical examination at a health care facility   [facility], [place] P, DO   8 months ago Type 2 diabetes mellitus with other neurologic complication, without long-term current use of insulin (HCC)   [place] [facility], Megan P, DO   10 months ago Type 2 diabetes mellitus with other neurologic complication, without long-term current use of insulin (HCC)   [place] [facility] [place], Megan P, DO              Refused Prescriptions Disp Refills   pravastatin  (PRAVACHOL ) 20 MG tablet [Pharmacy Med Name: Pravastatin  Sodium 20 MG Oral Tablet] 39 tablet 3    Sig: TAKE 1 TABLET BY MOUTH EVERY  MONDAY, WEDNESDAY, AND FRIDAY     Cardiovascular:  Antilipid - Statins Failed - 01/28/2024  1:53 PM      Failed - Lipid Panel in normal range within the last 12 months    Cholesterol, Total  Date Value Ref Range Status  06/27/2023 194 100 - 199 mg/dL Final   Cholesterol [place]  Date Value Ref Range Status  12/08/2017 156 <200 mg/dL Final    Comment:                            Desirable                <200                         Borderline High      200- 239                         High                     >239    LDL Chol Calc (NIH)  Date Value Ref Range Status  06/27/2023 113 (H) 0 - 99 mg/dL Final   HDL  Date Value Ref Range Status  06/27/2023 36 (L) >39 mg/dL Final   Triglycerides  Date Value Ref Range Status  06/27/2023 256 (H) 0 - 149 mg/dL Final   Triglycerides Piccolo,Waived  Date Value Ref Range Status  12/08/2017 308 (H) <150 mg/dL Final    Comment:                            Normal                   <150                         Borderline High     150 - 199                         High                200 - 499                         Very High                >499          Passed - Patient is not pregnant      Passed - Valid encounter  within last 12 months    Recent Outpatient Visits           3 months ago Type 2 diabetes mellitus with other neurologic complication, without long-term current use of insulin (HCC)   [place] [facility] [place], [place], DO   5 months ago Dizziness   [place] [facility] Herold Hadassah SQUIBB, MD   7 months ago Routine general medical examination at a health care facility   [facility], [place] P, DO   8 months ago Type 2 diabetes mellitus with other neurologic complication, without long-term current use of insulin (HCC)   [place] [facility], Megan P, DO   10 months ago Type 2 diabetes mellitus with other neurologic complication, without long-term current use of insulin [facility])    [place] [facility] [place], [place], DO

## 2024-01-28 NOTE — Telephone Encounter (Signed)
 Requested medication (s) are due for refill today - no  Requested medication (s) are on the active medication list -yes  Future visit scheduled -yes  Last refill: 12/09/23 #90  Notes to clinic: off protocol- provider review   Requested Prescriptions  Pending Prescriptions Disp Refills   RYBELSUS  14 MG TABS [Pharmacy Med Name: Rybelsus  14 MG Oral Tablet] 90 tablet 3    Sig: TAKE 1 TABLET BY MOUTH DAILY     Off-Protocol Failed - 01/28/2024  1:53 PM      Failed - Medication not assigned to a protocol, review manually.      Passed - Valid encounter within last 12 months    Recent Outpatient Visits           3 months ago Type 2 diabetes mellitus with other neurologic complication, without long-term current use of insulin (HCC)   [facility] [place], Megan P, DO   5 months ago Dizziness   [facility] Herold Hadassah SQUIBB, MD   7 months ago Routine general medical examination at a health care facility   [facility] [place] P, DO   8 months ago Type 2 diabetes mellitus with other neurologic complication, without long-term current use of insulin (HCC)   [facility], [facility] P, DO   10 months ago Type 2 diabetes mellitus with other neurologic complication, without long-term current use of insulin (HCC)   [facility] [place], [place], DO              Signed Prescriptions Disp Refills   losartan  (COZAAR ) 25 MG tablet 90 tablet 0    Sig: TAKE 1 TABLET BY MOUTH DAILY     Cardiovascular:  Angiotensin Receptor Blockers Failed - 01/28/2024  1:53 PM      Failed - Cr in normal range and within 180 days    Creatinine, Ser  Date Value Ref Range Status  08/28/2023 1.03 (H) 0.57 - 1.00 mg/dL Final         Passed - K in normal range and within 180 days    Potassium  Date Value Ref Range Status  08/28/2023 4.0 3.5 - 5.2 mmol/L Final         Passed -  Patient is not pregnant      Passed - Last BP in normal range    BP Readings from Last 1 Encounters:  10/07/23 129/78         Passed - Valid encounter within last 6 months    Recent Outpatient Visits           3 months ago Type 2 diabetes mellitus with other neurologic complication, without long-term current use of insulin (HCC)   [place] [facility] [place], [place], DO   5 months ago Dizziness   [place] [facility] Herold Hadassah SQUIBB, MD   7 months ago Routine general medical examination at a health care facility   [facility] [place] P, DO   8 months ago Type 2 diabetes mellitus with other neurologic complication, without long-term current use of insulin (HCC)   [place] [facility], Megan P, DO   10 months ago Type 2 diabetes mellitus with other neurologic complication, without long-term current use of insulin [facility])   [facility] [place], [place], DO  hydrochlorothiazide  (HYDRODIURIL ) 25 MG tablet 90 tablet 0    Sig: TAKE 1 TABLET BY MOUTH DAILY     Cardiovascular: Diuretics - Thiazide Failed - 01/28/2024  1:53 PM      Failed - Cr in normal range and within 180 days    Creatinine, Ser  Date Value Ref Range Status  08/28/2023 1.03 (H) 0.57 - 1.00 mg/dL Final         Passed - K in normal range and within 180 days    Potassium  Date Value Ref Range Status  08/28/2023 4.0 3.5 - 5.2 mmol/L Final         Passed - Na in normal range and within 180 days    Sodium  Date Value Ref Range Status  08/28/2023 136 134 - 144 mmol/L Final         Passed - Last BP in normal range    BP Readings from Last 1 Encounters:  10/07/23 129/78         Passed - Valid encounter within last 6 months    Recent Outpatient Visits           3 months ago Type 2 diabetes mellitus with other neurologic complication, without long-term current use of insulin (HCC)    [place] [facility] [place], Megan P, DO   5 months ago Dizziness   [place] [facility] Herold Hadassah SQUIBB, MD   7 months ago Routine general medical examination at a health care facility   [facility], [place] [facility], DO   8 months ago Type 2 diabetes mellitus with other neurologic complication, without long-term current use of insulin (HCC)   [facility] [place], Megan P, DO   10 months ago Type 2 diabetes mellitus with other neurologic complication, without long-term current use of insulin (HCC)    [facility] [place], Megan P, DO               DULoxetine  (CYMBALTA ) 20 MG capsule 90 capsule 0    Sig: TAKE 1 CAPSULE BY MOUTH DAILY     Psychiatry: Antidepressants - SNRI - duloxetine  Failed - 01/28/2024  1:53 PM      Failed - Cr in normal range and within 360 days    Creatinine, Ser  Date Value Ref Range Status  08/28/2023 1.03 (H) 0.57 - 1.00 mg/dL Final         Passed - eGFR is 30 or above and within 360 days    GFR calc Af Amer  Date Value Ref Range Status  10/20/2019 113 >59 mL/min/1.73 Final    Comment:    **In accordance with recommendations from the NKF-ASN Task force,**   [facility] is in the process of updating its eGFR calculation to the   2021 CKD-EPI creatinine equation that estimates kidney function   without a race variable.    GFR calc non Af Amer  Date Value Ref Range Status  10/20/2019 98 >59 mL/min/1.73 Final   eGFR  Date Value Ref Range Status  08/28/2023 62 >59 mL/min/1.73 Final         Passed - Completed PHQ-2 or PHQ-9 in the last 360 days      Passed - Last BP in normal range    BP Readings from Last 1 Encounters:  10/07/23 129/78         Passed - Valid encounter within last 6 months    Recent Outpatient Visits  3 months ago Type 2 diabetes mellitus with other neurologic complication, without long-term current use of insulin  (HCC)   [facility] [place], [place], DO   5 months ago Dizziness   [place] [facility] Herold Hadassah SQUIBB, MD   7 months ago Routine general medical examination at a health care facility   [facility], [place] P, DO   8 months ago Type 2 diabetes mellitus with other neurologic complication, without long-term current use of insulin (HCC)   [place] [facility], Megan P, DO   10 months ago Type 2 diabetes mellitus with other neurologic complication, without long-term current use of insulin (HCC)   [facility], Megan P, DO               levocetirizine (XYZAL ) 5 MG tablet 90 tablet 0    Sig: TAKE 1 TABLET BY MOUTH IN THE  EVENING     Ear, Nose, and Throat:  Antihistamines - levocetirizine dihydrochloride  Failed - 01/28/2024  1:53 PM      Failed - Cr in normal range and within 360 days    Creatinine, Ser  Date Value Ref Range Status  08/28/2023 1.03 (H) 0.57 - 1.00 mg/dL Final         Passed - eGFR is 10 or above and within 360 days    GFR calc Af Amer  Date Value Ref Range Status  10/20/2019 113 >59 mL/min/1.73 Final    Comment:    **In accordance with recommendations from the NKF-ASN Task force,**   [facility] is in the process of updating its eGFR calculation to the   2021 CKD-EPI creatinine equation that estimates kidney function   without a race variable.    GFR calc non Af Amer  Date Value Ref Range Status  10/20/2019 98 >59 mL/min/1.73 Final   eGFR  Date Value Ref Range Status  08/28/2023 62 >59 mL/min/1.73 Final         Passed - Valid encounter within last 12 months    Recent Outpatient Visits           3 months ago Type 2 diabetes mellitus with other neurologic complication, without long-term current use of insulin (HCC)   [place] [facility] [place], [place], DO   5 months ago Dizziness   [place] Herold Hadassah SQUIBB, MD   7 months ago Routine general medical examination at a health care facility   [facility], [place] P, DO   8 months ago Type 2 diabetes mellitus with other neurologic complication, without long-term current use of insulin (HCC)   [place] [facility], Megan P, DO   10 months ago Type 2 diabetes mellitus with other neurologic complication, without long-term current use of insulin (HCC)    [facility], [facility] P, DO              Refused Prescriptions Disp Refills   pravastatin  (PRAVACHOL ) 20 MG tablet [Pharmacy Med Name: Pravastatin  Sodium 20 MG Oral Tablet] 39 tablet 3    Sig: TAKE 1 TABLET BY MOUTH EVERY  MONDAY, WEDNESDAY, AND FRIDAY     Cardiovascular:  Antilipid - Statins Failed - 01/28/2024  1:53 PM      Failed - Lipid Panel in normal range within the last 12 months    Cholesterol, Total  Date Value Ref  Range Status  06/27/2023 194 100 - 199 mg/dL Final   Cholesterol [place], [place]  Date Value Ref Range Status  12/08/2017 156 <200 mg/dL Final    Comment:                            Desirable                <200                         Borderline High      200- 239                         High                     >239    LDL Chol Calc (NIH)  Date Value Ref Range Status  06/27/2023 113 (H) 0 - 99 mg/dL Final   HDL  Date Value Ref Range Status  06/27/2023 36 (L) >39 mg/dL Final   Triglycerides  Date Value Ref Range Status  06/27/2023 256 (H) 0 - 149 mg/dL Final   Triglycerides Piccolo,Waived  Date Value Ref Range Status  12/08/2017 308 (H) <150 mg/dL Final    Comment:                            Normal                   <150                         Borderline High     150 - 199                         High                200 - 499                         Very High                >499          Passed - Patient is not pregnant      Passed - Valid  encounter within last 12 months    Recent Outpatient Visits           3 months ago Type 2 diabetes mellitus with other neurologic complication, without long-term current use of insulin (HCC)   [facility] [place], [place], DO   5 months ago Dizziness   [place] [facility] Herold Hadassah SQUIBB, MD   7 months ago Routine general medical examination at a health care facility   [place] [facility] [facility], [place] P, DO   8 months ago Type 2 diabetes mellitus with other neurologic complication, without long-term current use of insulin (HCC)   [place] [facility] Megan P, DO   10 months ago Type 2 diabetes mellitus with other neurologic complication, without long-term current use of insulin [facility])   [facility] [place], [place] P, DO                 Requested Prescriptions  Pending Prescriptions Disp Refills   RYBELSUS   14 MG TABS [Pharmacy Med Name: Rybelsus  14 MG Oral Tablet] 90 tablet 3    Sig: TAKE 1 TABLET BY MOUTH DAILY     Off-Protocol Failed - 01/28/2024  1:53 PM      Failed - Medication not assigned to a protocol, review manually.      Passed - Valid encounter within last 12 months    Recent Outpatient Visits           3 months ago Type 2 diabetes mellitus with other neurologic complication, without long-term current use of insulin (HCC)   [place] [facility] [place], Megan P, DO   5 months ago Dizziness   [place] [facility] Herold Hadassah SQUIBB, MD   7 months ago Routine general medical examination at a health care facility   [facility], [place], DO   8 months ago Type 2 diabetes mellitus with other neurologic complication, without long-term current use of insulin (HCC)   [facility], Megan P, DO   10 months ago Type 2 diabetes mellitus with other neurologic complication,  without long-term current use of insulin (HCC)   [place] [place], Megan P, DO              Signed Prescriptions Disp Refills   losartan  (COZAAR ) 25 MG tablet 90 tablet 0    Sig: TAKE 1 TABLET BY MOUTH DAILY     Cardiovascular:  Angiotensin Receptor Blockers Failed - 01/28/2024  1:53 PM      Failed - Cr in normal range and within 180 days    Creatinine, Ser  Date Value Ref Range Status  08/28/2023 1.03 (H) 0.57 - 1.00 mg/dL Final         Passed - K in normal range and within 180 days    Potassium  Date Value Ref Range Status  08/28/2023 4.0 3.5 - 5.2 mmol/L Final         Passed - Patient is not pregnant      Passed - Last BP in normal range    BP Readings from Last 1 Encounters:  10/07/23 129/78         Passed - Valid encounter within last 6 months    Recent Outpatient Visits           3 months ago Type 2 diabetes mellitus with other neurologic complication, without long-term current use of insulin (HCC)   [facility] [facility] [place], [place], DO   5 months ago Dizziness   [facility] Herold Hadassah SQUIBB, MD   7 months ago Routine general medical examination at a health care facility   [facility], [place] P, DO   8 months ago Type 2 diabetes mellitus with other neurologic complication, without long-term current use of insulin (HCC)   [place] [facility], Megan P, DO   10 months ago Type 2 diabetes mellitus with other neurologic complication, without long-term current use of insulin (HCC)   [place] [facility], Megan P, DO               hydrochlorothiazide  (HYDRODIURIL ) 25 MG tablet 90 tablet 0    Sig: TAKE 1 TABLET BY MOUTH DAILY     Cardiovascular: Diuretics - Thiazide Failed - 01/28/2024  1:53 PM      Failed - Cr in normal range and within 180 days  Creatinine, Ser  Date Value Ref Range Status  08/28/2023  1.03 (H) 0.57 - 1.00 mg/dL Final         Passed - K in normal range and within 180 days    Potassium  Date Value Ref Range Status  08/28/2023 4.0 3.5 - 5.2 mmol/L Final         Passed - Na in normal range and within 180 days    Sodium  Date Value Ref Range Status  08/28/2023 136 134 - 144 mmol/L Final         Passed - Last BP in normal range    BP Readings from Last 1 Encounters:  10/07/23 129/78         Passed - Valid encounter within last 6 months    Recent Outpatient Visits           3 months ago Type 2 diabetes mellitus with other neurologic complication, without long-term current use of insulin (HCC)   [place] [place], Megan P, DO   5 months ago Dizziness   [facility] Herold Hadassah SQUIBB, MD   7 months ago Routine general medical examination at a health care facility   [facility], [place] P, DO   8 months ago Type 2 diabetes mellitus with other neurologic complication, without long-term current use of insulin (HCC)   [facility] [facility] [place], Megan P, DO   10 months ago Type 2 diabetes mellitus with other neurologic complication, without long-term current use of insulin (HCC)   [place] [facility] [place], Megan P, DO               DULoxetine  (CYMBALTA ) 20 MG capsule 90 capsule 0    Sig: TAKE 1 CAPSULE BY MOUTH DAILY     Psychiatry: Antidepressants - SNRI - duloxetine  Failed - 01/28/2024  1:53 PM      Failed - Cr in normal range and within 360 days    Creatinine, Ser  Date Value Ref Range Status  08/28/2023 1.03 (H) 0.57 - 1.00 mg/dL Final         Passed - eGFR is 30 or above and within 360 days    GFR calc Af Amer  Date Value Ref Range Status  10/20/2019 113 >59 mL/min/1.73 Final    Comment:    **In accordance with recommendations from the NKF-ASN Task force,**   [facility] is in the process of updating its eGFR calculation to the   2021  CKD-EPI creatinine equation that estimates kidney function   without a race variable.    GFR calc non Af Amer  Date Value Ref Range Status  10/20/2019 98 >59 mL/min/1.73 Final   eGFR  Date Value Ref Range Status  08/28/2023 62 >59 mL/min/1.73 Final         Passed - Completed PHQ-2 or PHQ-9 in the last 360 days      Passed - Last BP in normal range    BP Readings from Last 1 Encounters:  10/07/23 129/78         Passed - Valid encounter within last 6 months    Recent Outpatient Visits           3 months ago Type 2 diabetes mellitus with other neurologic complication, without long-term current use of insulin (HCC)   [place] [facility] [place], Megan P, DO   5 months ago Dizziness    [facility] Herold Hadassah SQUIBB, MD   7 months ago Routine general medical examination at a health care facility   [facility], [place] P, DO   8 months ago Type 2 diabetes mellitus with other neurologic complication, without long-term current use of insulin (HCC)   [place] [facility], [facility] P, DO   10 months ago Type 2 diabetes mellitus with other neurologic complication, without long-term current use of insulin (HCC)   [facility], [facility] P, DO               levocetirizine (XYZAL ) 5 MG tablet 90 tablet 0    Sig: TAKE 1 TABLET BY MOUTH IN THE  EVENING     Ear, Nose, and Throat:  Antihistamines - levocetirizine dihydrochloride  Failed - 01/28/2024  1:53 PM      Failed - Cr in normal range and within 360 days    Creatinine, Ser  Date Value Ref Range Status  08/28/2023 1.03 (H) 0.57 - 1.00 mg/dL Final         Passed - eGFR is 10 or above and within 360 days    GFR calc Af Amer  Date Value Ref Range Status  10/20/2019 113 >59 mL/min/1.73 Final    Comment:    **In accordance with recommendations from the NKF-ASN Task force,**   [facility] is in the process of updating  its eGFR calculation to the   2021 CKD-EPI creatinine equation that estimates kidney function   without a race variable.    GFR calc non Af Amer  Date Value Ref Range Status  10/20/2019 98 >59 mL/min/1.73 Final   eGFR  Date Value Ref Range Status  08/28/2023 62 >59 mL/min/1.73 Final         Passed - Valid encounter within last 12 months    Recent Outpatient Visits           3 months ago Type 2 diabetes mellitus with other neurologic complication, without long-term current use of insulin (HCC)   [place] [facility] [place], [place], DO   5 months ago Dizziness   [place] [facility] Herold Hadassah SQUIBB, MD   7 months ago Routine general medical examination at a health care facility   [facility], [place] [facility], DO   8 months ago Type 2 diabetes mellitus with other neurologic complication, without long-term current use of insulin (HCC)   [facility], [facility] P, DO   10 months ago Type 2 diabetes mellitus with other neurologic complication, without long-term current use of insulin (HCC)   [place] [facility] [place], Megan P, DO              Refused Prescriptions Disp Refills   pravastatin  (PRAVACHOL ) 20 MG tablet [Pharmacy Med Name: Pravastatin  Sodium 20 MG Oral Tablet] 39 tablet 3    Sig: TAKE 1 TABLET BY MOUTH EVERY  MONDAY, WEDNESDAY, AND FRIDAY     Cardiovascular:  Antilipid - Statins Failed - 01/28/2024  1:53 PM      Failed - Lipid Panel in normal range within the last 12 months    Cholesterol, Total  Date Value Ref Range Status  06/27/2023 194 100 - 199 mg/dL Final   Cholesterol Piccolo, Waived  Date Value Ref Range Status  12/08/2017 156 <200 mg/dL Final    Comment:  Desirable                <200                         Borderline High      200- 239                         High                     >239    LDL Chol Calc (NIH)   Date Value Ref Range Status  06/27/2023 113 (H) 0 - 99 mg/dL Final   HDL  Date Value Ref Range Status  06/27/2023 36 (L) >39 mg/dL Final   Triglycerides  Date Value Ref Range Status  06/27/2023 256 (H) 0 - 149 mg/dL Final   Triglycerides Piccolo,Waived  Date Value Ref Range Status  12/08/2017 308 (H) <150 mg/dL Final    Comment:                            Normal                   <150                         Borderline High     150 - 199                         High                200 - 499                         Very High                >499          Passed - Patient is not pregnant      Passed - Valid encounter within last 12 months    Recent Outpatient Visits           3 months ago Type 2 diabetes mellitus with other neurologic complication, without long-term current use of insulin (HCC)   [facility] [place], [place], DO   5 months ago Dizziness   [facility] Herold Hadassah SQUIBB, MD   7 months ago Routine general medical examination at a health care facility   [facility], [place] [facility], DO   8 months ago Type 2 diabetes mellitus with other neurologic complication, without long-term current use of insulin (HCC)   [place] [facility], Megan P, DO   10 months ago Type 2 diabetes mellitus with other neurologic complication, without long-term current use of insulin [facility])   [place] [facility] [place], [place], DO

## 2024-01-29 ENCOUNTER — Encounter: Payer: Self-pay | Admitting: Family Medicine

## 2024-01-29 ENCOUNTER — Other Ambulatory Visit: Payer: Self-pay

## 2024-01-29 DIAGNOSIS — E1149 Type 2 diabetes mellitus with other diabetic neurological complication: Secondary | ICD-10-CM

## 2024-01-29 NOTE — Telephone Encounter (Signed)
 [facility] Refill Encounter  Last office visit: 10/07/2023  Verified last visit within 6 months and next visit is scheduled: Yes   Next office visit: 02/04/2024   Patient record reviewed by CMA and medication due for refill: Yes  Requested Prescriptions   Pending Prescriptions Disp Refills   empagliflozin  (JARDIANCE ) 10 MG TABS tablet 90 tablet 1    Sig: Take 1 tablet (10 mg total) by mouth daily before breakfast.

## 2024-01-30 MED ORDER — EMPAGLIFLOZIN 10 MG PO TABS: 10.0000 mg | ORAL_TABLET | Freq: Every day | ORAL | 0 refills | Status: AC

## 2024-02-03 ENCOUNTER — Other Ambulatory Visit: Payer: Self-pay

## 2024-02-03 NOTE — Telephone Encounter (Signed)
Encounter placed in error.

## 2024-02-04 ENCOUNTER — Ambulatory Visit: Admitting: Family Medicine

## 2024-03-22 ENCOUNTER — Ambulatory Visit: Admitting: Family Medicine
